# Patient Record
Sex: Male | Born: 1960 | Race: White | Hispanic: No | Marital: Single | Smoking: Never smoker
Health system: Southern US, Community
[De-identification: ages and names within clinical notes are randomized; demographics above are authoritative.]

## PROBLEM LIST (undated history)

## (undated) DIAGNOSIS — I472 Ventricular tachycardia, unspecified: Secondary | ICD-10-CM

## (undated) DIAGNOSIS — A048 Other specified bacterial intestinal infections: Secondary | ICD-10-CM

## (undated) DIAGNOSIS — I340 Nonrheumatic mitral (valve) insufficiency: Secondary | ICD-10-CM

## (undated) DIAGNOSIS — Z8701 Personal history of pneumonia (recurrent): Secondary | ICD-10-CM

## (undated) DIAGNOSIS — I480 Paroxysmal atrial fibrillation: Secondary | ICD-10-CM

## (undated) HISTORY — DX: Other cardiomyopathies: I42.8

## (undated) HISTORY — DX: Personal history of pneumonia (recurrent): Z87.01

## (undated) HISTORY — DX: Ventricular tachycardia, unspecified: I47.20

## (undated) HISTORY — DX: Chronic systolic (congestive) heart failure: I50.22

## (undated) HISTORY — DX: Ventricular tachycardia: I47.2

## (undated) HISTORY — DX: Paroxysmal atrial fibrillation: I48.0

## (undated) HISTORY — DX: Nonrheumatic mitral (valve) insufficiency: I34.0

---

## 2011-10-06 HISTORY — PX: WRIST SURGERY: SHX841

## 2012-06-28 ENCOUNTER — Emergency Department: Payer: Self-pay | Admitting: *Deleted

## 2013-08-31 IMAGING — CR LEFT WRIST - COMPLETE 3+ VIEW
1 series · 4 of 4 positions shown · non-contrast
Comparison: none

REASON FOR EXAM: laceration
COMMENTS:

[Series 1: x wrist pa left · 0.14mm/px · 4 of 4 slices shown]
[im 1/4]
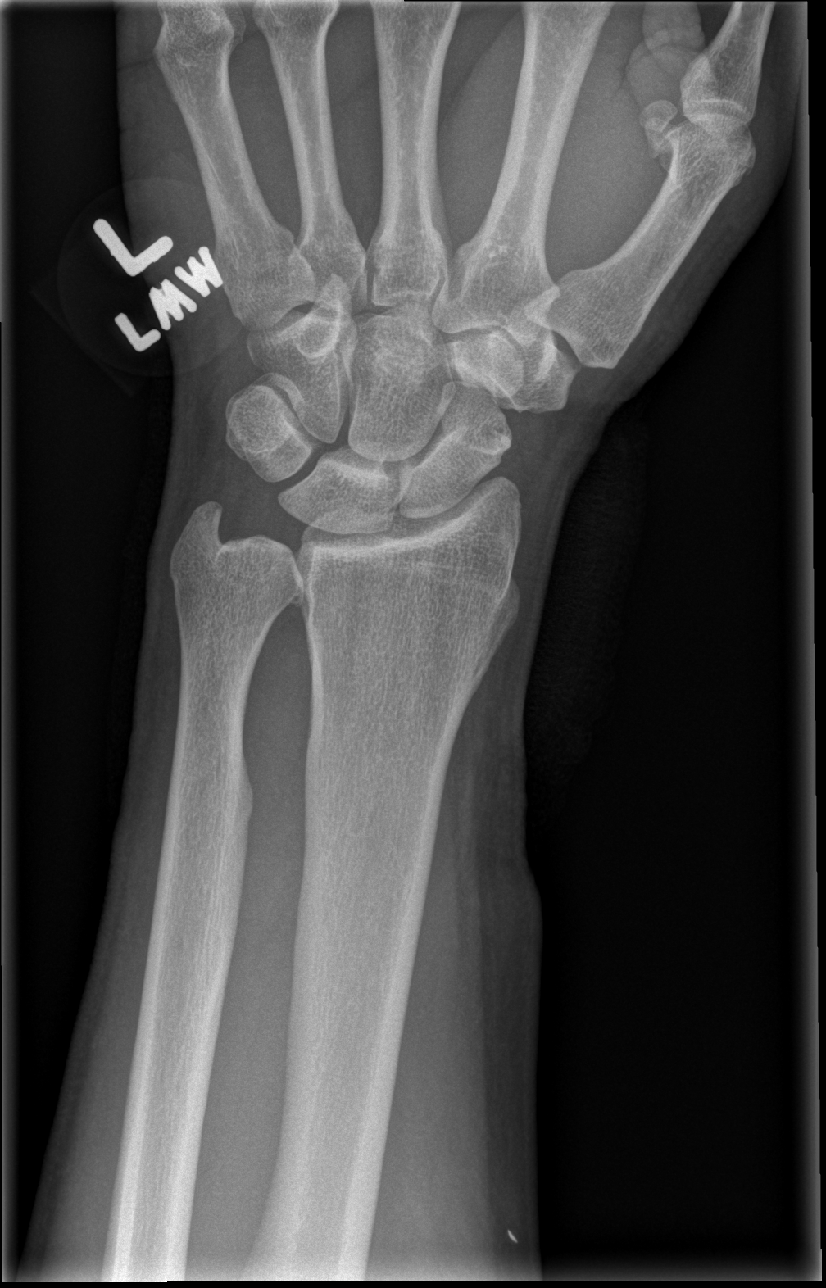
[im 2/4]
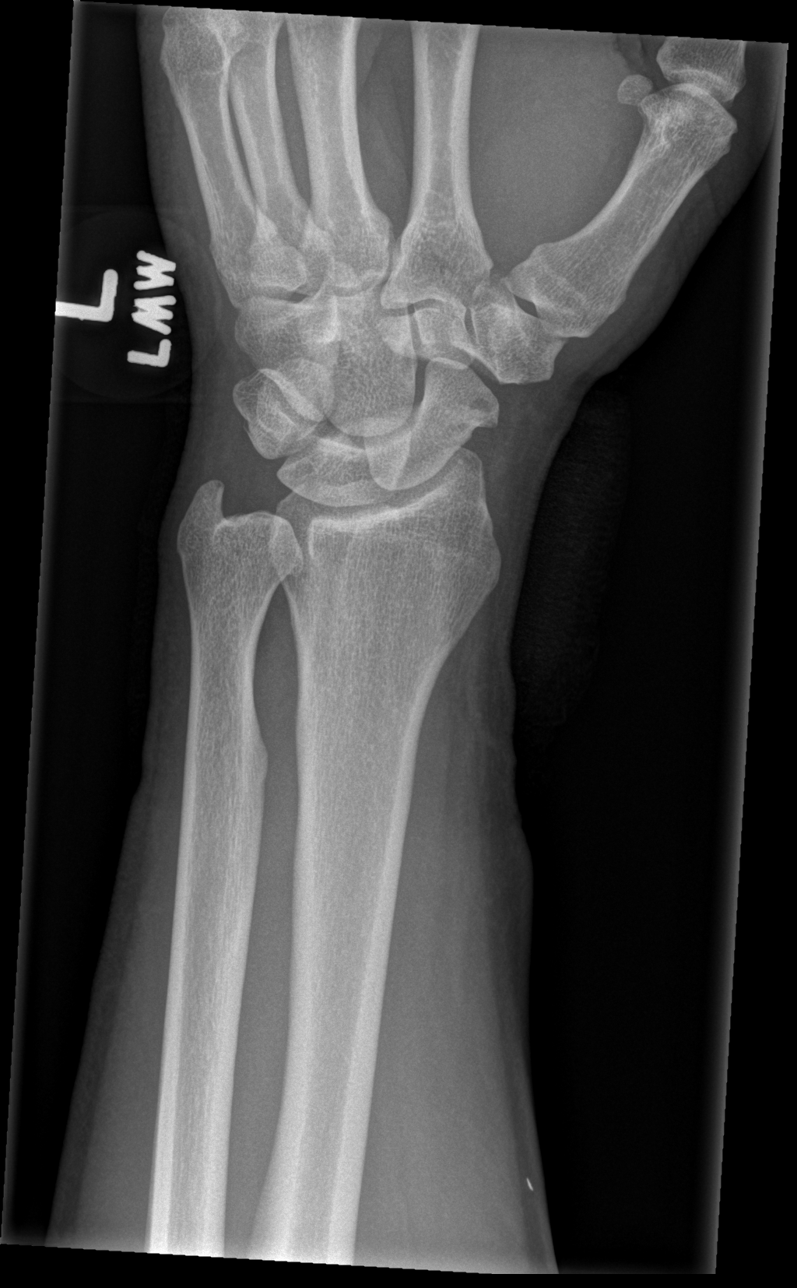
[im 3/4]
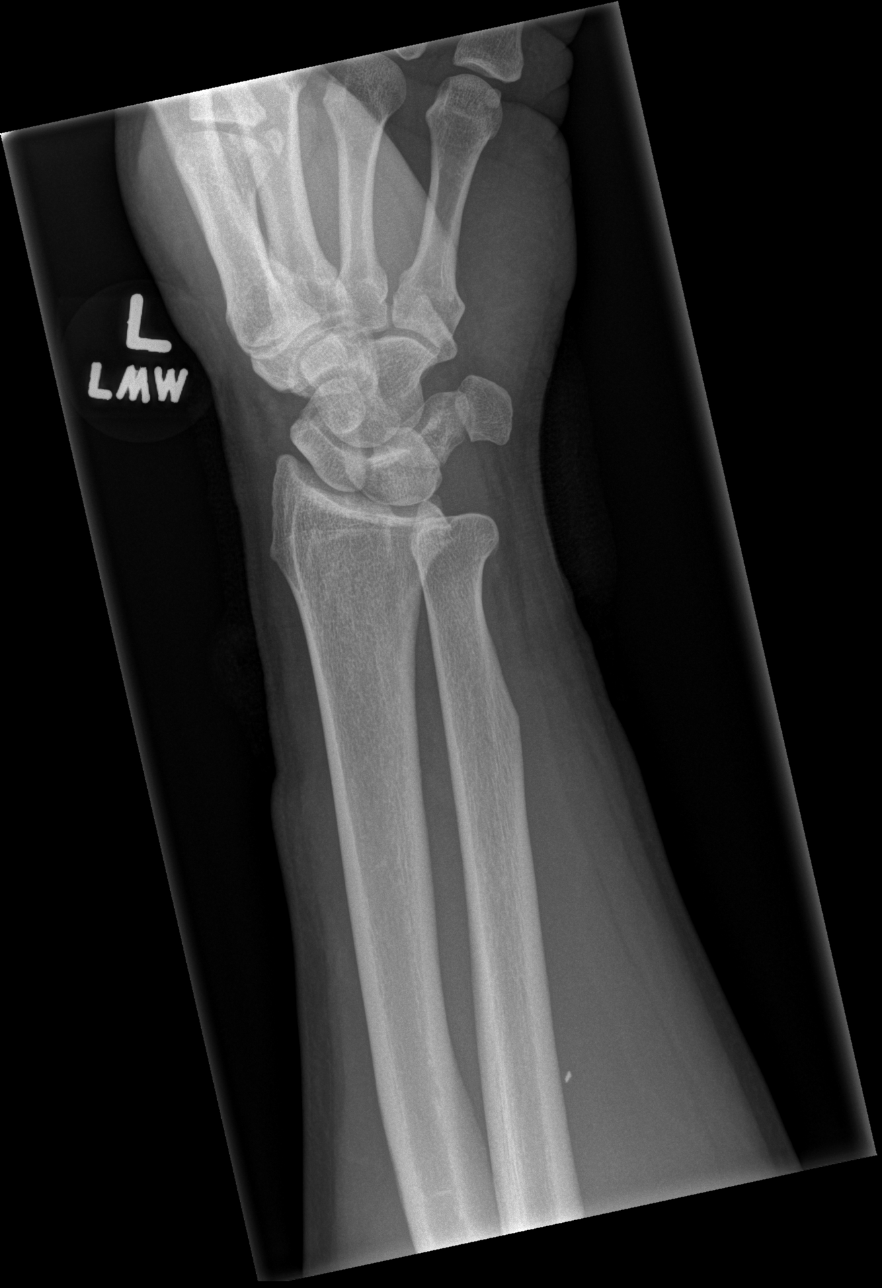
[im 4/4]
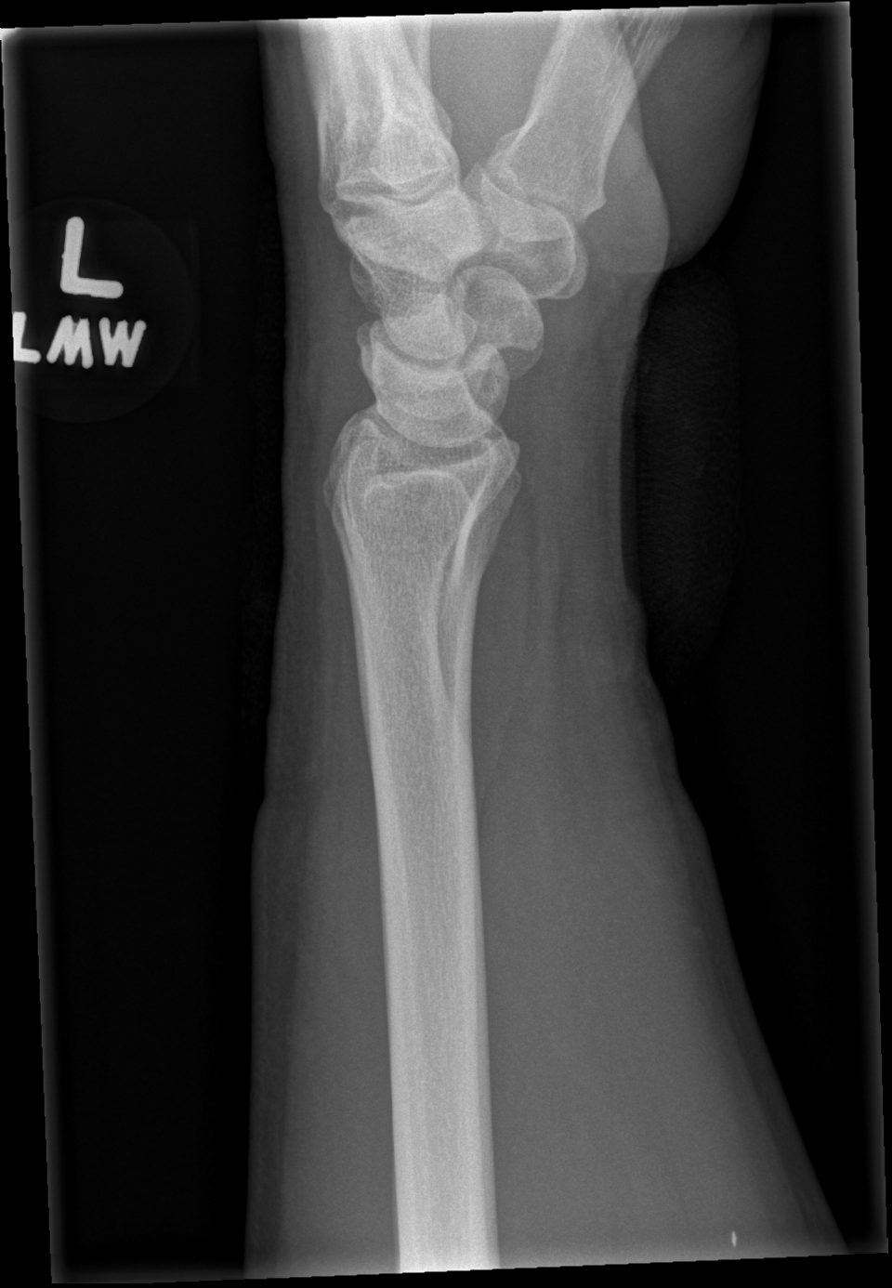

[4 of 4 positions shown; findings below may reference images not displayed]

PROCEDURE:     DXR - DXR WRIST LT COMP WITH OBLIQUES  - June 29, 2012  [DATE]

RESULT:     Four views of the left wrist reveal the bones to be adequately
mineralized. There is no evidence of an acute fracture nor dislocation. The
overlying soft tissues are normal in appearance. No significant degenerative
changes are demonstrated.
IMPRESSION: There is no evidence of an acute fracture nor evidence of a
retained foreign body. Some disruption of the soft tissues over the ventral
aspect of the wrist may be present but there is dressing material here.

[REDACTED]

## 2014-03-12 ENCOUNTER — Encounter: Payer: Self-pay | Admitting: Internal Medicine

## 2014-03-12 ENCOUNTER — Ambulatory Visit (INDEPENDENT_AMBULATORY_CARE_PROVIDER_SITE_OTHER): Payer: BC Managed Care – HMO | Admitting: Internal Medicine

## 2014-03-12 VITALS — BP 130/86 | HR 58 | Temp 98.2°F | Ht 73.0 in | Wt 255.0 lb

## 2014-03-12 DIAGNOSIS — Z125 Encounter for screening for malignant neoplasm of prostate: Secondary | ICD-10-CM

## 2014-03-12 DIAGNOSIS — Z1211 Encounter for screening for malignant neoplasm of colon: Secondary | ICD-10-CM

## 2014-03-12 DIAGNOSIS — E669 Obesity, unspecified: Secondary | ICD-10-CM | POA: Insufficient documentation

## 2014-03-12 DIAGNOSIS — Z0000 Encounter for general adult medical examination without abnormal findings: Secondary | ICD-10-CM

## 2014-03-12 NOTE — Progress Notes (Signed)
HPI  Pt presents to the clinic today to establish care. He has not had a PCP in many years. He has no concerns today.  Flu: never Tetanus: 06/2012 PSA screening: never Colon Screening: never Eye doctor: as needed Dentist: biannually  History reviewed. No pertinent past medical history.  Current Outpatient Prescriptions  Medication Sig Dispense Refill  . multivitamin (ONE-A-DAY MEN'S) TABS tablet Take 1 tablet by mouth daily.       No current facility-administered medications for this visit.    No Known Allergies  Family History  Problem Relation Age of Onset  . Hypertension Father     History   Social History  . Marital Status: Single    Spouse Name: N/A    Number of Children: N/A  . Years of Education: N/A   Occupational History  . Not on file.   Social History Main Topics  . Smoking status: Never Smoker   . Smokeless tobacco: Never Used  . Alcohol Use: Yes     Comment: occasional  . Drug Use: Not on file  . Sexual Activity: Not on file   Other Topics Concern  . Not on file   Social History Narrative  . No narrative on file    ROS:  Constitutional: Denies fever, malaise, fatigue, headache or abrupt weight changes.  HEENT: Denies eye pain, eye redness, ear pain, ringing in the ears, wax buildup, runny nose, nasal congestion, bloody nose, or sore throat. Respiratory: Denies difficulty breathing, shortness of breath, cough or sputum production.   Cardiovascular: Denies chest pain, chest tightness, palpitations or swelling in the hands or feet.  Gastrointestinal: Denies abdominal pain, bloating, constipation, diarrhea or blood in the stool.  GU: Denies frequency, urgency, pain with urination, blood in urine, odor or discharge. Musculoskeletal: Denies decrease in range of motion, difficulty with gait, muscle pain or joint pain and swelling.  Skin: Denies redness, rashes, lesions or ulcercations.  Neurological: Denies dizziness, difficulty with memory,  difficulty with speech or problems with balance and coordination.   No other specific complaints in a complete review of systems (except as listed in HPI above).  PE:  BP 130/86  Pulse 58  Temp(Src) 98.2 F (36.8 C) (Oral)  Ht 6\' 1"  (1.854 m)  Wt 255 lb (115.667 kg)  BMI 33.65 kg/m2  SpO2 98% Wt Readings from Last 3 Encounters:  03/12/14 255 lb (115.667 kg)    General: Appears their stated age, well developed, well nourished in NAD. HEENT: Head: normal shape and size; Eyes: sclera white, no icterus, conjunctiva pink, PERRLA and EOMs intact; Ears: Tm's gray and intact, normal light reflex; Nose: mucosa pink and moist, septum midline; Throat/Mouth: Teeth present, mucosa pink and moist, no lesions or ulcerations noted.  Neck: Normal range of motion. Neck supple, trachea midline. No massses, lumps or thyromegaly present.  Cardiovascular: Normal rate and rhythm. S1,S2 noted.  No murmur, rubs or gallops noted. No JVD or BLE edema. No carotid bruits noted. Pulmonary/Chest: Normal effort and positive vesicular breath sounds. No respiratory distress. No wheezes, rales or ronchi noted.  Abdomen: Soft and nontender. Normal bowel sounds, no bruits noted. No distention or masses noted. Liver, spleen and kidneys non palpable. Musculoskeletal: Normal range of motion. No signs of joint swelling. No difficulty with gait.  Neurological: Alert and oriented. Cranial nerves II-XII intact. Coordination normal. +DTRs bilaterally. Psychiatric: Mood and affect normal. Behavior is normal. Judgment and thought content normal.     Assessment and Plan:  Preventative Health Maintenance:  Encouraged  him to work on diet and exercise Will check screening labs today includng PSA Will refer to GI for colon screening  RTC in 1 year or sooner if needed

## 2014-03-12 NOTE — Patient Instructions (Addendum)

## 2014-03-12 NOTE — Progress Notes (Signed)
Pre visit review using our clinic review tool, if applicable. No additional management support is needed unless otherwise documented below in the visit note. 

## 2014-03-13 LAB — CBC
HCT: 42 % (ref 39.0–52.0)
Hemoglobin: 14.3 g/dL (ref 13.0–17.0)
MCHC: 34 g/dL (ref 30.0–36.0)
MCV: 92 fl (ref 78.0–100.0)
Platelets: 192 K/uL (ref 150.0–400.0)
RBC: 4.57 Mil/uL (ref 4.22–5.81)
RDW: 13 % (ref 11.5–15.5)
WBC: 7.2 K/uL (ref 4.0–10.5)

## 2014-03-13 LAB — COMPREHENSIVE METABOLIC PANEL
ALBUMIN: 4.4 g/dL (ref 3.5–5.2)
ALT: 64 U/L — ABNORMAL HIGH (ref 0–53)
AST: 36 U/L (ref 0–37)
Alkaline Phosphatase: 65 U/L (ref 39–117)
BUN: 18 mg/dL (ref 6–23)
CALCIUM: 9.3 mg/dL (ref 8.4–10.5)
CO2: 27 mEq/L (ref 19–32)
Chloride: 103 mEq/L (ref 96–112)
Creatinine, Ser: 1 mg/dL (ref 0.4–1.5)
GFR: 83.97 mL/min (ref >60.00)
GLUCOSE: 75 mg/dL (ref 70–99)
POTASSIUM: 4.2 meq/L (ref 3.5–5.1)
Sodium: 139 mEq/L (ref 135–145)
TOTAL PROTEIN: 7 g/dL (ref 6.0–8.3)
Total Bilirubin: 0.6 mg/dL (ref 0.2–1.2)

## 2014-03-13 LAB — HEMOGLOBIN A1C: HEMOGLOBIN A1C: 6 % (ref 4.6–6.5)

## 2014-03-13 LAB — LIPID PANEL
CHOLESTEROL: 251 mg/dL — AB (ref 0–200)
HDL: 36.4 mg/dL — ABNORMAL LOW (ref >39.00)
LDL CALC: 140 mg/dL — AB (ref 0–99)
NonHDL: 214.6
TRIGLYCERIDES: 373 mg/dL — AB (ref 0.0–149.0)
Total CHOL/HDL Ratio: 7
VLDL: 74.6 mg/dL — ABNORMAL HIGH (ref 0.0–40.0)

## 2014-03-13 LAB — PSA: PSA: 1.03 ng/mL (ref 0.10–4.00)

## 2014-03-14 ENCOUNTER — Encounter: Payer: Self-pay | Admitting: Gastroenterology

## 2014-03-14 ENCOUNTER — Telehealth: Payer: Self-pay | Admitting: Internal Medicine

## 2014-03-14 NOTE — Telephone Encounter (Signed)
Patient returned your call and he said he does want to try the 3 months of trying to improve his cholesterol on his own.  He wants to know what kind of fish oil he needs to buy. Please call patient.

## 2014-03-15 NOTE — Telephone Encounter (Signed)
Left detailed msg on VM with information per HIPAA

## 2014-05-07 ENCOUNTER — Encounter: Payer: BC Managed Care – HMO | Admitting: Gastroenterology

## 2015-11-16 ENCOUNTER — Emergency Department: Payer: BLUE CROSS/BLUE SHIELD

## 2015-11-16 ENCOUNTER — Emergency Department
Admission: EM | Admit: 2015-11-16 | Discharge: 2015-11-16 | Disposition: A | Discharge status: Home / Self Care | Payer: BLUE CROSS/BLUE SHIELD | Attending: Emergency Medicine | Admitting: Emergency Medicine

## 2015-11-16 ENCOUNTER — Encounter: Payer: Self-pay | Admitting: Emergency Medicine

## 2015-11-16 DIAGNOSIS — J159 Unspecified bacterial pneumonia: Secondary | ICD-10-CM | POA: Diagnosis not present

## 2015-11-16 DIAGNOSIS — R0602 Shortness of breath: Secondary | ICD-10-CM | POA: Diagnosis present

## 2015-11-16 DIAGNOSIS — J189 Pneumonia, unspecified organism: Secondary | ICD-10-CM

## 2015-11-16 DIAGNOSIS — Z79899 Other long term (current) drug therapy: Secondary | ICD-10-CM | POA: Insufficient documentation

## 2015-11-16 LAB — COMPREHENSIVE METABOLIC PANEL
ALK PHOS: 63 U/L (ref 38–126)
ALT: 59 U/L (ref 17–63)
AST: 43 U/L — AB (ref 15–41)
Albumin: 3.8 g/dL (ref 3.5–5.0)
Anion gap: 8 (ref 5–15)
BILIRUBIN TOTAL: 1 mg/dL (ref 0.3–1.2)
BUN: 16 mg/dL (ref 6–20)
CALCIUM: 9 mg/dL (ref 8.9–10.3)
CO2: 24 mmol/L (ref 22–32)
CREATININE: 0.95 mg/dL (ref 0.61–1.24)
Chloride: 105 mmol/L (ref 101–111)
GFR calc Af Amer: >60 mL/min (ref >60)
Glucose, Bld: 133 mg/dL — ABNORMAL HIGH (ref 65–99)
Potassium: 4.2 mmol/L (ref 3.5–5.1)
Sodium: 137 mmol/L (ref 135–145)
TOTAL PROTEIN: 7.4 g/dL (ref 6.5–8.1)

## 2015-11-16 LAB — CBC WITH DIFFERENTIAL/PLATELET
BASOS ABS: 0 10*3/uL (ref 0–0.1)
Basophils Relative: 1 %
EOS ABS: 0.1 10*3/uL (ref 0–0.7)
EOS PCT: 1 %
HCT: 43.8 % (ref 40.0–52.0)
Hemoglobin: 14.5 g/dL (ref 13.0–18.0)
Lymphocytes Relative: 19 %
Lymphs Abs: 2 10*3/uL (ref 1.0–3.6)
MCH: 31.3 pg (ref 26.0–34.0)
MCHC: 33 g/dL (ref 32.0–36.0)
MCV: 94.8 fL (ref 80.0–100.0)
Monocytes Absolute: 0.8 10*3/uL (ref 0.2–1.0)
Monocytes Relative: 8 %
Neutro Abs: 7.8 10*3/uL — ABNORMAL HIGH (ref 1.4–6.5)
Neutrophils Relative %: 73 %
PLATELETS: 250 10*3/uL (ref 150–440)
RBC: 4.62 MIL/uL (ref 4.40–5.90)
RDW: 13.2 % (ref 11.5–14.5)
WBC: 10.7 10*3/uL — AB (ref 3.8–10.6)

## 2015-11-16 NOTE — Discharge Instructions (Signed)

## 2015-11-16 NOTE — ED Notes (Signed)
Recently diagnosed with pneumonia.  Being treated with Levaquin, on dose left.  Patient states he has been feeling SOB thorughout  Coarse of illness, but feels it worsening a little bit.  Due to go back to work tomorrow, still feeling SOB and Dyspneic .  Sensations are worse when laying down.

## 2015-11-16 NOTE — ED Provider Notes (Signed)
[facility] Emergency Department Provider Note  ____________________________________________  Time seen: Approximately 6:22 PM  I have reviewed the triage vital signs and the nursing notes.   HISTORY  Chief Complaint Shortness of Breath    HPI Walter Banks is a 55 y.o. male who presents to the emergency department concerned about his pneumonia. Patient states that he was diagnosed with pneumonia on 11/10/2015. He was placed on Levaquin for symptoms. 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 He is here for further evaluation on his ability to return to work. Patient states that he is still mildly short of breath with coughing with lying down as well as bending over to tie his shoes. Otherwise she states that there has been an improvement in his symptoms. He denies any fevers or chills, difficulty breathing, dizziness or lightheadedness, chest pain, nausea or vomiting. Patient has been taking his antibiotics as prescribed. He has 1 more day on current regimen. She reports overall that he is feeling "much better."   Past Medical History  Diagnosis Date  . Pneumonia     Patient Active Problem List   Diagnosis Date Noted  . Obesity (BMI 30-39.9) 03/12/2014    Past Surgical History  Procedure Laterality Date  . Wrist surgery Left 2013    Current Outpatient Rx  Name  Route  Sig  Dispense  Refill  . multivitamin (ONE-A-DAY MEN'S) TABS tablet   Oral   Take 1 tablet by mouth daily.           Allergies Review of patient's allergies indicates no known allergies.  Family History  Problem Relation Age of Onset  . Hypertension Father   . Cancer Neg Hx   . Diabetes Neg Hx   . Stroke Neg Hx     Social History Social History  Substance Use Topics  . Smoking status: Never Smoker   . Smokeless tobacco: Never Used  . Alcohol Use: 4.8 oz/week    8 Cans of beer per week     Comment: occasional      Review of Systems  Constitutional: No fever/chills Cardiovascular: no chest pain. Respiratory: no cough. No SOB. Gastrointestinal: No abdominal pain.  No nausea, no vomiting.   Musculoskeletal: Negative for back pain. Skin: Negative for rash. Neurological: Negative for headaches, focal weakness or numbness. 10-point ROS otherwise negative.  ____________________________________________   PHYSICAL EXAM:  VITAL SIGNS: ED Triage Vitals  Enc Vitals Group     BP 11/16/15 1609 133/98 mmHg     Pulse Rate 11/16/15 1609 62     Resp 11/16/15 1609 18     Temp 11/16/15 1609 97.7 F (36.5 C)     Temp src --      SpO2 11/16/15 1609 97 %     Weight 11/16/15 1609 240 lb (108.863 kg)     Height 11/16/15 1609 6\' 1"  (1.854 m)     Head Cir --      Peak Flow --      Pain Score 11/16/15 1610 0     Pain Loc --      Pain Edu? --      Excl. in GC? --      Constitutional: Alert and oriented. Well appearing and in no acute distress. Eyes: Conjunctivae are normal. PERRL. EOMI. Head: Atraumatic. ENT:      Ears:       Nose: No congestion/rhinnorhea.      Mouth/Throat:  Mucous membranes are moist.  Neck: No stridor.   Hematological/Lymphatic/Immunilogical: No cervical lymphadenopathy. Cardiovascular: Normal rate, regular rhythm. Normal S1 and S2.  Good peripheral circulation. Respiratory: Normal respiratory effort without tachypnea or retractions. Coarse breath sounds to the left upper lobe but no appreciable wheezing, rales, rhonchi. Good air entry into the bases. Neurologic:  Normal speech and language. No gross focal neurologic deficits are appreciated.  Skin:  Skin is warm, dry and intact. No rash noted. Psychiatric: Mood and affect are normal. Speech and behavior are normal. Patient exhibits appropriate insight and judgement.   ____________________________________________   LABS (all labs ordered are listed, but only abnormal results are displayed)  Labs Reviewed  COMPREHENSIVE  METABOLIC PANEL - Abnormal; Notable for the following:    Glucose, Bld 133 (*)    AST 43 (*)    All other components within normal limits  CBC WITH DIFFERENTIAL/PLATELET - Abnormal; Notable for the following:    WBC 10.7 (*)    Neutro Abs 7.8 (*)    All other components within normal limits   ____________________________________________  EKG   ____________________________________________  RADIOLOGY Festus Barren Marilee Ditommaso, personally viewed and evaluated these images (plain radiographs) as part of my medical decision making, as well as reviewing the written report by the radiologist.  Dg Chest 2 View  11/16/2015  CLINICAL DATA:  Recent diagnosis of pneumonia with improved symptoms EXAM: CHEST  2 VIEW COMPARISON:  None. FINDINGS: Cardiac shadow is mildly enlarged. The lungs are well aerated bilaterally. No sizable effusion is noted. Curvilinear increased density is noted in the left mid lung and perihilar region projecting in the left upper lobe on the lateral projection IMPRESSION: Persistent infiltrative density in the left upper lobe. Continued follow-up is recommended. Electronically Signed   By: Alcide Clever M.D.   On: 11/16/2015 17:35    ____________________________________________    PROCEDURES  Procedure(s) performed:       Medications - No data to display   ____________________________________________   INITIAL IMPRESSION / ASSESSMENT AND PLAN / ED COURSE  Pertinent labs & imaging results that were available during my care of the patient were reviewed by me and considered in my medical decision making (see chart for details).  Patient's diagnosis is consistent with resolving left lung pneumonia. The patient's labs, x-ray, and physical exam is reassuring at this time. Patient is to finish his course of Levaquin which will be finished tomorrow. He may return to work with no shift and at this time. Patient will follow up with his primary care provider in 4-6 weeks  for repeat x-ray. He will follow up with primary care provider sooner if symptoms of pneumonia return. Patient is given ED precautions to return to the ED for any worsening or new symptoms.     ____________________________________________  FINAL CLINICAL IMPRESSION(S) / ED DIAGNOSES  Final diagnoses:  Community acquired pneumonia      NEW MEDICATIONS STARTED DURING THIS VISIT:  New Prescriptions   No medications on file        Racheal Patches, PA-C 11/16/15 1837  Governor Rooks, MD 11/17/15 0002

## 2015-11-16 NOTE — ED Notes (Addendum)
Pt states he was recently diagnosed with PNA on 2/5 and was on antibiotics and will complete tonight. Pt was seen at a walk-in clinic and given Levaquin.  He is also using inhaler every few hours for shortness of breath. Pt is worried about going back to work and needs a work note if not.  Pt's work note from urgent care is good for patient to go back to work on Sunday 2/12.

## 2015-11-26 ENCOUNTER — Encounter: Payer: Self-pay | Admitting: Internal Medicine

## 2015-11-26 ENCOUNTER — Ambulatory Visit (INDEPENDENT_AMBULATORY_CARE_PROVIDER_SITE_OTHER): Payer: BLUE CROSS/BLUE SHIELD | Admitting: Internal Medicine

## 2015-11-26 VITALS — BP 120/76 | HR 115 | Temp 97.4°F | Wt 246.0 lb

## 2015-11-26 DIAGNOSIS — J189 Pneumonia, unspecified organism: Secondary | ICD-10-CM | POA: Diagnosis not present

## 2015-11-26 NOTE — Patient Instructions (Signed)

## 2015-11-26 NOTE — Progress Notes (Signed)
Subjective:    Patient ID: Walter Banks, male    DOB: 01-31-1961, 55 y.o.   MRN: [id number]  HPI  Pt presents to the clinic today to follow up pneumonia. He reports he went to [facility] 11/10/15 for cough and shortness of breath. Chest xray was consistent with pneumonia. He was treated with Levaquin x 7 days. He went to the ER on 11/16/15 to get clearance to go back to work. He was feeling better but reports he still had some cough and shortness of breath. Xray at that time showed some improvement in his pneumonia. He was advised to finish out his Levaquin and that he should be fine to return to work.  He is concerned today about ongoing intermittent cough. The cough is nonproductive. He denies fever, chills or shortness of breath. The cough is not keeping him up at night. He is concerned that his pneumonia is returning. He has not tried anything OTC. He has not had his flu shot.  Review of Systems      Past Medical History  Diagnosis Date  . Pneumonia     Current Outpatient Prescriptions  Medication Sig Dispense Refill  . multivitamin (ONE-A-DAY MEN'S) TABS tablet Take 1 tablet by mouth daily.    Marland Kitchen PROAIR HFA 108 (90 Base) MCG/ACT inhaler INHALE 2 PUFF BY INHALATION ROUTE EVERY 4 - 6 HOURS AS NEEDED  0  . fluticasone (FLONASE) 50 MCG/ACT nasal spray Reported on 11/26/2015  0   No current facility-administered medications for this visit.    No Known Allergies  Family History  Problem Relation Age of Onset  . Hypertension Father   . Cancer Neg Hx   . Diabetes Neg Hx   . Stroke Neg Hx     Social History   Social History  . Marital Status: Single    Spouse Name: N/A  . Number of Children: N/A  . Years of Education: N/A   Occupational History  . Not on file.   Social History Main Topics  . Smoking status: Never Smoker   . Smokeless tobacco: Never Used  . Alcohol Use: 4.8 oz/week    8 Cans of beer per week     Comment: occasional  . Drug Use: No  . Sexual Activity: Yes     Other Topics Concern  . Not on file   Social History Narrative     Constitutional: Denies fever, malaise, fatigue, headache or abrupt weight changes.  HEENT: Denies eye pain, eye redness, ear pain, ringing in the ears, wax buildup, runny nose, nasal congestion, bloody nose, or sore throat. Respiratory: Pt reports cough.  Denies difficulty breathing, shortness of breath, cough or sputum production.   Cardiovascular: Denies chest pain, chest tightness, palpitations or swelling in the hands or feet.   No other specific complaints in a complete review of systems (except as listed in HPI above).  Objective:   Physical Exam  BP 120/76 mmHg  Pulse 115  Temp(Src) 97.4 F (36.3 C) (Oral)  Wt 246 lb (111.585 kg)  SpO2 97% Wt Readings from Last 3 Encounters:  11/26/15 246 lb (111.585 kg)  11/16/15 240 lb (108.863 kg)  03/12/14 255 lb (115.667 kg)    General: Appears his stated age, well developed, well nourished in NAD. HEENT: Head: normal shape and size; Throat/Mouth: Teeth present, mucosa pink and moist, no exudate, lesions or ulcerations noted.  Neck:  No adenopathy noted.  Cardiovascular: Tachycardic with normal rhythm. S1,S2 noted.  No murmur, rubs or  gallops noted.  Pulmonary/Chest: Normal effort and positive vesicular breath sounds. No respiratory distress. No wheezes, rales or ronchi noted.    BMET    Component Value Date/Time   NA 137 11/16/2015 1612   K 4.2 11/16/2015 1612   CL 105 11/16/2015 1612   CO2 24 11/16/2015 1612   GLUCOSE 133* 11/16/2015 1612   BUN 16 11/16/2015 1612   CREATININE 0.95 11/16/2015 1612   CALCIUM 9.0 11/16/2015 1612   GFRNONAA >60 11/16/2015 1612   GFRAA >60 11/16/2015 1612    Lipid Panel     Component Value Date/Time   CHOL 251* 03/12/2014 1537   TRIG 373.0* 03/12/2014 1537   HDL 36.40* 03/12/2014 1537   CHOLHDL 7 03/12/2014 1537   VLDL 74.6* 03/12/2014 1537   LDLCALC 140* 03/12/2014 1537    CBC    Component Value Date/Time    WBC 10.7* 11/16/2015 1701   RBC 4.62 11/16/2015 1701   HGB 14.5 11/16/2015 1701   HCT 43.8 11/16/2015 1701   PLT 250 11/16/2015 1701   MCV 94.8 11/16/2015 1701   MCH 31.3 11/16/2015 1701   MCHC 33.0 11/16/2015 1701   RDW 13.2 11/16/2015 1701   LYMPHSABS 2.0 11/16/2015 1701   MONOABS 0.8 11/16/2015 1701   EOSABS 0.1 11/16/2015 1701   BASOSABS 0.0 11/16/2015 1701    Hgb A1C Lab Results  Component Value Date   HGBA1C 6.0 03/12/2014         Assessment & Plan:   UC/ER follow up for CAP:  Reassurance provided that his pneumonia is clearing, but he may continue to have this cough for 4-6 weeks He can take cough drops OTC We will repeat his chest xray in 1 month to check for resolution of pna Return precautions given  Make an appt for your annual exam on the way out

## 2015-11-26 NOTE — Progress Notes (Signed)
Pre visit review using our clinic review tool, if applicable. No additional management support is needed unless otherwise documented below in the visit note. 

## 2015-12-02 ENCOUNTER — Ambulatory Visit (INDEPENDENT_AMBULATORY_CARE_PROVIDER_SITE_OTHER): Payer: BLUE CROSS/BLUE SHIELD | Admitting: Internal Medicine

## 2015-12-02 ENCOUNTER — Encounter: Payer: Self-pay | Admitting: Internal Medicine

## 2015-12-02 VITALS — BP 122/80 | HR 126 | Temp 97.8°F | Wt 248.0 lb

## 2015-12-02 DIAGNOSIS — R0602 Shortness of breath: Secondary | ICD-10-CM | POA: Diagnosis not present

## 2015-12-02 DIAGNOSIS — R05 Cough: Secondary | ICD-10-CM | POA: Diagnosis not present

## 2015-12-02 DIAGNOSIS — R059 Cough, unspecified: Secondary | ICD-10-CM

## 2015-12-02 DIAGNOSIS — J189 Pneumonia, unspecified organism: Secondary | ICD-10-CM | POA: Diagnosis not present

## 2015-12-02 NOTE — Progress Notes (Signed)
Pre visit review using our clinic review tool, if applicable. No additional management support is needed unless otherwise documented below in the visit note. 

## 2015-12-02 NOTE — Progress Notes (Signed)
Subjective:    Patient ID: Walter Banks, male    DOB: 10/29/1960, 55 y.o.   MRN: [id number]  HPI  Pt presents to the clinic today to follow up pneumonia. He reports he went to [facility] 11/10/15 for cough and shortness of breath. Chest xray was consistent with pneumonia. He was treated with Levaquin x 7 days. He went to the ER on 11/16/15 to get clearance to go back to work. He was feeling better but reports he still had some cough and shortness of breath. Xray at that time showed some improvement in his pneumonia. He was advised to finish out his Levaquin and that he should be fine to return to work. He was seen 2/21- to follow up pneumonia. He reports intermittent ongoing cough but no SOB. He was advised that cough may persist and he should follow up in 1 month for repeat chest xray. Today, he reports increasing shortness of breath, sweats and weakness. He started feeling worse again 3 days ago. He is not taking anything OTC for this.  He is concerned today about ongoing intermittent cough. The cough is nonproductive. He denies fever, chills or shortness of breath. The cough is not keeping him up at night. He is concerned that his pneumonia is returning. He has not tried anything OTC. He has not had his flu shot.  Review of Systems      Past Medical History  Diagnosis Date  . Pneumonia     Current Outpatient Prescriptions  Medication Sig Dispense Refill  . multivitamin (ONE-A-DAY MEN'S) TABS tablet Take 1 tablet by mouth daily.    . fluticasone (FLONASE) 50 MCG/ACT nasal spray Reported on 12/02/2015  0  . PROAIR HFA 108 (90 Base) MCG/ACT inhaler Reported on 12/02/2015  0   No current facility-administered medications for this visit.    No Known Allergies  Family History  Problem Relation Age of Onset  . Hypertension Father   . Cancer Neg Hx   . Diabetes Neg Hx   . Stroke Neg Hx     Social History   Social History  . Marital Status: Single    Spouse Name: N/A  . Number of  Children: N/A  . Years of Education: N/A   Occupational History  . Not on file.   Social History Main Topics  . Smoking status: Never Smoker   . Smokeless tobacco: Never Used  . Alcohol Use: 4.8 oz/week    8 Cans of beer per week     Comment: occasional  . Drug Use: No  . Sexual Activity: Yes   Other Topics Concern  . Not on file   Social History Narrative     Constitutional: Pt reports fatigue. Denies fever, malaise, headache or abrupt weight changes.  HEENT: Denies eye pain, eye redness, ear pain, ringing in the ears, wax buildup, runny nose, nasal congestion, bloody nose, or sore throat. Respiratory: Pt reports cough and shortness of  breath.  Denies difficulty breathing, or sputum production.   Cardiovascular: Denies chest pain, chest tightness, palpitations or swelling in the hands or feet.   No other specific complaints in a complete review of systems (except as listed in HPI above).  Objective:   Physical Exam  Pulse 126  Temp(Src) 97.8 F (36.6 C) (Oral)  Wt 248 lb (112.492 kg)  SpO2 98% Wt Readings from Last 3 Encounters:  12/02/15 248 lb (112.492 kg)  11/26/15 246 lb (111.585 kg)  11/16/15 240 lb (108.863 kg)  General: Appears his stated age,  in NAD. HEENT: Head: normal shape and size; Throat/Mouth: Teeth present, mucosa pink and moist, no exudate, lesions or ulcerations noted.  Neck:  No adenopathy noted.  Cardiovascular: Tachycardic with normal rhythm. S1,S2 noted.  No murmur, rubs or gallops noted.  Pulmonary/Chest: Normal effort and positive vesicular breath sounds. No respiratory distress. No wheezes, rales or ronchi noted.    BMET    Component Value Date/Time   NA 137 11/16/2015 1612   K 4.2 11/16/2015 1612   CL 105 11/16/2015 1612   CO2 24 11/16/2015 1612   GLUCOSE 133* 11/16/2015 1612   BUN 16 11/16/2015 1612   CREATININE 0.95 11/16/2015 1612   CALCIUM 9.0 11/16/2015 1612   GFRNONAA >60 11/16/2015 1612   GFRAA >60 11/16/2015 1612     Lipid Panel     Component Value Date/Time   CHOL 251* 03/12/2014 1537   TRIG 373.0* 03/12/2014 1537   HDL 36.40* 03/12/2014 1537   CHOLHDL 7 03/12/2014 1537   VLDL 74.6* 03/12/2014 1537   LDLCALC 140* 03/12/2014 1537    CBC    Component Value Date/Time   WBC 10.7* 11/16/2015 1701   RBC 4.62 11/16/2015 1701   HGB 14.5 11/16/2015 1701   HCT 43.8 11/16/2015 1701   PLT 250 11/16/2015 1701   MCV 94.8 11/16/2015 1701   MCH 31.3 11/16/2015 1701   MCHC 33.0 11/16/2015 1701   RDW 13.2 11/16/2015 1701   LYMPHSABS 2.0 11/16/2015 1701   MONOABS 0.8 11/16/2015 1701   EOSABS 0.1 11/16/2015 1701   BASOSABS 0.0 11/16/2015 1701    Hgb A1C Lab Results  Component Value Date   HGBA1C 6.0 03/12/2014         Assessment & Plan:   CAP, cough and shortness of breath:  He is tachycardic but exam otherwise benign Will check CBC, CMET, and D dimer today If WBC elevated, consider additional antibiotics If D dimer elevated, will obtain CT angio chest to rule out PE Rest, push fluids Work note provided to stay out of work this week  Will follow up after labs, to ER if worse

## 2015-12-02 NOTE — Patient Instructions (Signed)

## 2015-12-03 ENCOUNTER — Telehealth: Payer: Self-pay

## 2015-12-03 LAB — CBC WITH DIFFERENTIAL/PLATELET
BASOS PCT: 1 % (ref 0.0–3.0)
Basophils Absolute: 0.1 10*3/uL (ref 0.0–0.1)
EOS PCT: 1.4 % (ref 0.0–5.0)
Eosinophils Absolute: 0.1 10*3/uL (ref 0.0–0.7)
HEMATOCRIT: 39.3 % (ref 39.0–52.0)
Hemoglobin: 13.3 g/dL (ref 13.0–17.0)
LYMPHS PCT: 24.7 % (ref 12.0–46.0)
Lymphs Abs: 1.8 10*3/uL (ref 0.7–4.0)
MCHC: 33.7 g/dL (ref 30.0–36.0)
MCV: 94 fl (ref 78.0–100.0)
MONOS PCT: 11 % (ref 3.0–12.0)
Monocytes Absolute: 0.8 10*3/uL (ref 0.1–1.0)
NEUTROS PCT: 61.9 % (ref 43.0–77.0)
Neutro Abs: 4.6 10*3/uL (ref 1.4–7.7)
PLATELETS: 179 10*3/uL (ref 150.0–400.0)
RBC: 4.19 Mil/uL — ABNORMAL LOW (ref 4.22–5.81)
RDW: 13.6 % (ref 11.5–15.5)
WBC: 7.4 10*3/uL (ref 4.0–10.5)

## 2015-12-03 LAB — COMPREHENSIVE METABOLIC PANEL
ALT: 31 U/L (ref 0–53)
AST: 34 U/L (ref 0–37)
Albumin: 3.7 g/dL (ref 3.5–5.2)
Alkaline Phosphatase: 55 U/L (ref 39–117)
BILIRUBIN TOTAL: 1.1 mg/dL (ref 0.2–1.2)
BUN: 20 mg/dL (ref 6–23)
CALCIUM: 8.8 mg/dL (ref 8.4–10.5)
CHLORIDE: 107 meq/L (ref 96–112)
CO2: 28 meq/L (ref 19–32)
CREATININE: 0.96 mg/dL (ref 0.40–1.50)
GFR: 86.44 mL/min (ref >60.00)
Glucose, Bld: 82 mg/dL (ref 70–99)
Potassium: 5 mEq/L (ref 3.5–5.1)
Sodium: 141 mEq/L (ref 135–145)
Total Protein: 6.2 g/dL (ref 6.0–8.3)

## 2015-12-03 NOTE — Telephone Encounter (Signed)
We are still waiting on some ofhis labs to come back. His electrolytes and WBC count are normal. Is he not sleeping because he is coughing?

## 2015-12-03 NOTE — Telephone Encounter (Signed)
Pt left v/m; pt was seen 12/02/15; pt request cb with lab results and pt request med to help pt sleep at night to [facility] [place]. Pt did not sleep last night at all. Pt request cb.

## 2015-12-03 NOTE — Telephone Encounter (Signed)
Pt walked in to office and is waiting in the waiting room; the reason pt could not sleep was due to abd pain;pt said if standing minimal pain but when sits or lays down pain level goes to 10.

## 2015-12-03 NOTE — Telephone Encounter (Signed)
Pt notified as instructed and pt scheduled appt 12/04/15 with Pamala Hurry NP. Advised pt if condition changes or worsens prior to appt pt will go to UC or ED for eval. Pt voiced understanding.

## 2015-12-03 NOTE — Telephone Encounter (Signed)
He did not discuss any abdominal pain with me. He can try Tylenol. He can make an appt to discuss abdominal pain if he would like.

## 2015-12-04 ENCOUNTER — Ambulatory Visit (INDEPENDENT_AMBULATORY_CARE_PROVIDER_SITE_OTHER)
Admission: RE | Admit: 2015-12-04 | Discharge: 2015-12-04 | Disposition: A | Discharge status: Home / Self Care | Payer: BLUE CROSS/BLUE SHIELD | Source: Ambulatory Visit | Attending: Internal Medicine | Admitting: Internal Medicine

## 2015-12-04 ENCOUNTER — Ambulatory Visit (INDEPENDENT_AMBULATORY_CARE_PROVIDER_SITE_OTHER): Payer: BLUE CROSS/BLUE SHIELD | Admitting: Internal Medicine

## 2015-12-04 ENCOUNTER — Encounter: Payer: Self-pay | Admitting: Internal Medicine

## 2015-12-04 VITALS — BP 124/84 | HR 114 | Temp 97.5°F | Wt 249.0 lb

## 2015-12-04 DIAGNOSIS — R140 Abdominal distension (gaseous): Secondary | ICD-10-CM

## 2015-12-04 DIAGNOSIS — R110 Nausea: Secondary | ICD-10-CM | POA: Diagnosis not present

## 2015-12-04 DIAGNOSIS — I5022 Chronic systolic (congestive) heart failure: Secondary | ICD-10-CM

## 2015-12-04 DIAGNOSIS — K6289 Other specified diseases of anus and rectum: Secondary | ICD-10-CM

## 2015-12-04 HISTORY — DX: Chronic systolic (congestive) heart failure: I50.22

## 2015-12-04 LAB — D-DIMER, QUANTITATIVE (NOT AT ARMC): D DIMER QUANT: 0.48 ug{FEU}/mL (ref 0.00–0.48)

## 2015-12-04 LAB — AMYLASE: AMYLASE: 22 U/L — AB (ref 27–131)

## 2015-12-04 LAB — LIPASE: Lipase: 19 U/L (ref 11.0–59.0)

## 2015-12-04 NOTE — Progress Notes (Signed)
Subjective:    Patient ID: Walter Banks, male    DOB: 08-24-1961, 55 y.o.   MRN: [id number]  HPI  Pt presents to the clinic today with c/o abdominal pain. This started 1-2 weeks ago. He describes the abdominal pain as tightness. The pain is worse with sitting, laying down. It seems better when he is standing up or walking. He does have some associated bloating, gas and nausea. He is have a few small BM's daily, but reports a burning sensation in his rectum with bowel movements. He denies blood in his stool. He denies urinary complaints. He denies reflux or heartburn. He was on Levaquin 11/2015 for treatment of community acquired pneumonia but other than that he denies changes in medications or diet.  Review of Systems      Past Medical History  Diagnosis Date  . Pneumonia     Current Outpatient Prescriptions  Medication Sig Dispense Refill  . fluticasone (FLONASE) 50 MCG/ACT nasal spray Reported on 12/02/2015  0  . multivitamin (ONE-A-DAY MEN'S) TABS tablet Take 1 tablet by mouth daily.    Marland Kitchen PROAIR HFA 108 (90 Base) MCG/ACT inhaler Reported on 12/02/2015  0   No current facility-administered medications for this visit.    No Known Allergies  Family History  Problem Relation Age of Onset  . Hypertension Father   . Cancer Neg Hx   . Diabetes Neg Hx   . Stroke Neg Hx     Social History   Social History  . Marital Status: Single    Spouse Name: N/A  . Number of Children: N/A  . Years of Education: N/A   Occupational History  . Not on file.   Social History Main Topics  . Smoking status: Never Smoker   . Smokeless tobacco: Never Used  . Alcohol Use: 4.8 oz/week    8 Cans of beer per week     Comment: occasional  . Drug Use: No  . Sexual Activity: Yes   Other Topics Concern  . Not on file   Social History Narrative     Constitutional: Denies fever, malaise, fatigue, headache or abrupt weight changes.  Respiratory: Pt reports shortness of breath. Denies  difficulty breathing,  cough or sputum production.   Cardiovascular: Denies chest pain, chest tightness, palpitations or swelling in the hands or feet.  Gastrointestinal: Pt reports abdominal pain. Denies bloating, constipation, diarrhea or blood in the stool.  GU: Denies urgency, frequency, pain with urination, burning sensation, blood in urine, odor or discharge.  No other specific complaints in a complete review of systems (except as listed in HPI above).   Objective:   Physical Exam  BP 124/84 mmHg  Pulse 114  Temp(Src) 97.5 F (36.4 C) (Oral)  Wt 249 lb (112.946 kg)  SpO2 96% Wt Readings from Last 3 Encounters:  12/04/15 249 lb (112.946 kg)  12/02/15 248 lb (112.492 kg)  11/26/15 246 lb (111.585 kg)    General: Appears his stated age, in NAD. 9 lb weight gain in last 30 days. Cardiovascular: Tachycardic with normal rhythm. S1,S2 noted.  No murmur, rubs or gallops noted.  Pulmonary/Chest: Normal effort and positive vesicular breath sounds. No respiratory distress. No wheezes, rales or ronchi noted.  Abdomen: Distended but soft and tender in the RUQ, epigastric area. He is guarding. Normal bowel sounds. No masses noted. Liver, spleen and kidneys non palpable. Rectal: No external hemorrhoids. Normal rectal tone. No mass noted. Mucousy stool noted in the rectal vault.  BMET  Component Value Date/Time   NA 141 12/02/2015 1658   K 5.0 12/02/2015 1658   CL 107 12/02/2015 1658   CO2 28 12/02/2015 1658   GLUCOSE 82 12/02/2015 1658   BUN 20 12/02/2015 1658   CREATININE 0.96 12/02/2015 1658   CALCIUM 8.8 12/02/2015 1658   GFRNONAA >60 11/16/2015 1612   GFRAA >60 11/16/2015 1612    Lipid Panel     Component Value Date/Time   CHOL 251* 03/12/2014 1537   TRIG 373.0* 03/12/2014 1537   HDL 36.40* 03/12/2014 1537   CHOLHDL 7 03/12/2014 1537   VLDL 74.6* 03/12/2014 1537   LDLCALC 140* 03/12/2014 1537    CBC    Component Value Date/Time   WBC 7.4 12/02/2015 1658   RBC  4.19* 12/02/2015 1658   HGB 13.3 12/02/2015 1658   HCT 39.3 12/02/2015 1658   PLT 179.0 12/02/2015 1658   MCV 94.0 12/02/2015 1658   MCH 31.3 11/16/2015 1701   MCHC 33.7 12/02/2015 1658   RDW 13.6 12/02/2015 1658   LYMPHSABS 1.8 12/02/2015 1658   MONOABS 0.8 12/02/2015 1658   EOSABS 0.1 12/02/2015 1658   BASOSABS 0.1 12/02/2015 1658    Hgb A1C Lab Results  Component Value Date   HGBA1C 6.0 03/12/2014         Assessment & Plan:   Abdominal pain, distention, bloating, nausea, and burning sensation in rectum:  Hemoccult negative He had a normal CMET last week Will check H Pylori, amylase and lipase today KUB ordered today Avoid NSAIDS, Tylenol as needed for pain  Will follow up after labs and xray, to ER if worse

## 2015-12-04 NOTE — Patient Instructions (Signed)

## 2015-12-04 NOTE — Progress Notes (Signed)
Pre visit review using our clinic review tool, if applicable. No additional management support is needed unless otherwise documented below in the visit note. 

## 2015-12-05 ENCOUNTER — Other Ambulatory Visit: Payer: Self-pay | Admitting: Internal Medicine

## 2015-12-05 ENCOUNTER — Telehealth: Payer: Self-pay | Admitting: Internal Medicine

## 2015-12-05 LAB — H. PYLORI ANTIBODY, IGG: H PYLORI IGG: POSITIVE — AB

## 2015-12-05 MED ORDER — PANTOPRAZOLE SODIUM 40 MG PO TBEC: 40.0000 mg | DELAYED_RELEASE_TABLET | Freq: Two times a day (BID) | ORAL | Status: DC

## 2015-12-05 MED ORDER — AMOXICILLIN 500 MG PO CAPS: 1000.0000 mg | ORAL_CAPSULE | Freq: Two times a day (BID) | ORAL | Status: DC

## 2015-12-05 MED ORDER — CLARITHROMYCIN 500 MG PO TABS: 500.0000 mg | ORAL_TABLET | Freq: Two times a day (BID) | ORAL | Status: DC

## 2015-12-05 NOTE — Telephone Encounter (Signed)
Patient returned Melanie's call.  Please call patient back about lab results.

## 2015-12-08 ENCOUNTER — Inpatient Hospital Stay (HOSPITAL_COMMUNITY)
Admit: 2015-12-08 | Discharge: 2015-12-08 | Disposition: A | Discharge status: Home / Self Care | Payer: BLUE CROSS/BLUE SHIELD | Attending: Internal Medicine | Admitting: Internal Medicine

## 2015-12-08 ENCOUNTER — Emergency Department: Payer: BLUE CROSS/BLUE SHIELD

## 2015-12-08 ENCOUNTER — Inpatient Hospital Stay
Admission: EM | Admit: 2015-12-08 | Discharge: 2015-12-13 | DRG: 286 | Disposition: A | Discharge status: Home / Self Care | Payer: BLUE CROSS/BLUE SHIELD | Attending: Internal Medicine | Admitting: Internal Medicine

## 2015-12-08 ENCOUNTER — Encounter: Payer: Self-pay | Admitting: Family Medicine

## 2015-12-08 DIAGNOSIS — Z792 Long term (current) use of antibiotics: Secondary | ICD-10-CM

## 2015-12-08 DIAGNOSIS — Z8249 Family history of ischemic heart disease and other diseases of the circulatory system: Secondary | ICD-10-CM

## 2015-12-08 DIAGNOSIS — K2970 Gastritis, unspecified, without bleeding: Secondary | ICD-10-CM | POA: Diagnosis present

## 2015-12-08 DIAGNOSIS — Z6831 Body mass index (BMI) 31.0-31.9, adult: Secondary | ICD-10-CM

## 2015-12-08 DIAGNOSIS — I2510 Atherosclerotic heart disease of native coronary artery without angina pectoris: Secondary | ICD-10-CM | POA: Diagnosis not present

## 2015-12-08 DIAGNOSIS — J810 Acute pulmonary edema: Secondary | ICD-10-CM

## 2015-12-08 DIAGNOSIS — I110 Hypertensive heart disease with heart failure: Secondary | ICD-10-CM | POA: Diagnosis present

## 2015-12-08 DIAGNOSIS — I509 Heart failure, unspecified: Secondary | ICD-10-CM

## 2015-12-08 DIAGNOSIS — E669 Obesity, unspecified: Secondary | ICD-10-CM | POA: Diagnosis present

## 2015-12-08 DIAGNOSIS — I4819 Other persistent atrial fibrillation: Secondary | ICD-10-CM

## 2015-12-08 DIAGNOSIS — I428 Other cardiomyopathies: Secondary | ICD-10-CM

## 2015-12-08 DIAGNOSIS — I959 Hypotension, unspecified: Secondary | ICD-10-CM | POA: Diagnosis not present

## 2015-12-08 DIAGNOSIS — I5031 Acute diastolic (congestive) heart failure: Secondary | ICD-10-CM | POA: Diagnosis not present

## 2015-12-08 DIAGNOSIS — I5021 Acute systolic (congestive) heart failure: Secondary | ICD-10-CM

## 2015-12-08 DIAGNOSIS — B9681 Helicobacter pylori [H. pylori] as the cause of diseases classified elsewhere: Secondary | ICD-10-CM | POA: Diagnosis present

## 2015-12-08 DIAGNOSIS — I472 Ventricular tachycardia, unspecified: Secondary | ICD-10-CM

## 2015-12-08 DIAGNOSIS — I340 Nonrheumatic mitral (valve) insufficiency: Secondary | ICD-10-CM | POA: Diagnosis present

## 2015-12-08 DIAGNOSIS — I420 Dilated cardiomyopathy: Secondary | ICD-10-CM

## 2015-12-08 DIAGNOSIS — R0602 Shortness of breath: Secondary | ICD-10-CM | POA: Diagnosis present

## 2015-12-08 DIAGNOSIS — I429 Cardiomyopathy, unspecified: Secondary | ICD-10-CM | POA: Diagnosis not present

## 2015-12-08 DIAGNOSIS — Z79899 Other long term (current) drug therapy: Secondary | ICD-10-CM | POA: Diagnosis not present

## 2015-12-08 DIAGNOSIS — I4891 Unspecified atrial fibrillation: Secondary | ICD-10-CM | POA: Diagnosis present

## 2015-12-08 HISTORY — DX: Other specified bacterial intestinal infections: A04.8

## 2015-12-08 LAB — COMPREHENSIVE METABOLIC PANEL
ALBUMIN: 3.4 g/dL — AB (ref 3.5–5.0)
ALK PHOS: 57 U/L (ref 38–126)
ALT: 43 U/L (ref 17–63)
AST: 47 U/L — AB (ref 15–41)
Anion gap: 10 (ref 5–15)
BUN: 19 mg/dL (ref 6–20)
CHLORIDE: 110 mmol/L (ref 101–111)
CO2: 20 mmol/L — AB (ref 22–32)
CREATININE: 0.92 mg/dL (ref 0.61–1.24)
Calcium: 8.2 mg/dL — ABNORMAL LOW (ref 8.9–10.3)
GFR calc Af Amer: >60 mL/min (ref >60)
GLUCOSE: 99 mg/dL (ref 65–99)
POTASSIUM: 4.2 mmol/L (ref 3.5–5.1)
SODIUM: 140 mmol/L (ref 135–145)
Total Bilirubin: 1.9 mg/dL — ABNORMAL HIGH (ref 0.3–1.2)
Total Protein: 6 g/dL — ABNORMAL LOW (ref 6.5–8.1)

## 2015-12-08 LAB — CBC
HEMATOCRIT: 40.1 % (ref 40.0–52.0)
HEMOGLOBIN: 13.4 g/dL (ref 13.0–18.0)
MCH: 31.8 pg (ref 26.0–34.0)
MCHC: 33.4 g/dL (ref 32.0–36.0)
MCV: 95.2 fL (ref 80.0–100.0)
Platelets: 158 10*3/uL (ref 150–440)
RBC: 4.21 MIL/uL — AB (ref 4.40–5.90)
RDW: 13.8 % (ref 11.5–14.5)
WBC: 9.5 10*3/uL (ref 3.8–10.6)

## 2015-12-08 LAB — TROPONIN I
TROPONIN I: 0.04 ng/mL — AB (ref <0.031)
Troponin I: <0.03 ng/mL (ref <0.031)
Troponin I: <0.03 ng/mL (ref <0.031)

## 2015-12-08 LAB — MAGNESIUM: MAGNESIUM: 1.9 mg/dL (ref 1.7–2.4)

## 2015-12-08 LAB — TSH: TSH: 1.506 u[IU]/mL (ref 0.350–4.500)

## 2015-12-08 LAB — BRAIN NATRIURETIC PEPTIDE: B Natriuretic Peptide: 673 pg/mL — ABNORMAL HIGH (ref 0.0–100.0)

## 2015-12-08 MED ORDER — FUROSEMIDE 10 MG/ML IJ SOLN
20.0000 mg | Freq: Two times a day (BID) | INTRAMUSCULAR | Status: DC
  Administered 2015-12-08 – 2015-12-09 (×2): 20 mg via INTRAVENOUS
  Filled 2015-12-08 (×2): qty 2

## 2015-12-08 MED ORDER — ENOXAPARIN SODIUM 40 MG/0.4ML ~~LOC~~ SOLN
40.0000 mg | SUBCUTANEOUS | Status: DC
  Filled 2015-12-08: qty 0.4

## 2015-12-08 MED ORDER — ACETAMINOPHEN 650 MG RE SUPP: 650.0000 mg | Freq: Four times a day (QID) | RECTAL | Status: DC | PRN

## 2015-12-08 MED ORDER — DILTIAZEM HCL 60 MG PO TABS
60.0000 mg | ORAL_TABLET | Freq: Three times a day (TID) | ORAL | Status: DC
  Administered 2015-12-08 – 2015-12-09 (×3): 60 mg via ORAL
  Filled 2015-12-08 (×3): qty 1

## 2015-12-08 MED ORDER — APIXABAN 2.5 MG PO TABS
2.5000 mg | ORAL_TABLET | Freq: Two times a day (BID) | ORAL | Status: DC
  Administered 2015-12-08 – 2015-12-09 (×3): 2.5 mg via ORAL
  Filled 2015-12-08 (×3): qty 1

## 2015-12-08 MED ORDER — ONDANSETRON HCL 4 MG PO TABS: 4.0000 mg | ORAL_TABLET | Freq: Four times a day (QID) | ORAL | Status: DC | PRN

## 2015-12-08 MED ORDER — CLARITHROMYCIN 500 MG PO TABS
500.0000 mg | ORAL_TABLET | Freq: Two times a day (BID) | ORAL | Status: DC
  Administered 2015-12-08 – 2015-12-09 (×4): 500 mg via ORAL
  Filled 2015-12-08 (×6): qty 1

## 2015-12-08 MED ORDER — AMOXICILLIN 500 MG PO CAPS
1000.0000 mg | ORAL_CAPSULE | Freq: Two times a day (BID) | ORAL | Status: DC
  Administered 2015-12-08 – 2015-12-09 (×4): 1000 mg via ORAL
  Filled 2015-12-08 (×7): qty 2

## 2015-12-08 MED ORDER — DILTIAZEM HCL 25 MG/5ML IV SOLN
10.0000 mg | Freq: Once | INTRAVENOUS | Status: AC
  Administered 2015-12-08: 10 mg via INTRAVENOUS
  Filled 2015-12-08: qty 5

## 2015-12-08 MED ORDER — PANTOPRAZOLE SODIUM 40 MG PO TBEC
40.0000 mg | DELAYED_RELEASE_TABLET | Freq: Two times a day (BID) | ORAL | Status: DC
  Administered 2015-12-08 – 2015-12-09 (×4): 40 mg via ORAL
  Filled 2015-12-08 (×4): qty 1

## 2015-12-08 MED ORDER — ASPIRIN 81 MG PO CHEW
81.0000 mg | CHEWABLE_TABLET | Freq: Every day | ORAL | Status: DC
  Filled 2015-12-08: qty 1

## 2015-12-08 MED ORDER — FUROSEMIDE 10 MG/ML IJ SOLN
40.0000 mg | Freq: Once | INTRAMUSCULAR | Status: AC
  Administered 2015-12-08: 40 mg via INTRAVENOUS
  Filled 2015-12-08: qty 4

## 2015-12-08 MED ORDER — ONDANSETRON HCL 4 MG/2ML IJ SOLN: 4.0000 mg | Freq: Four times a day (QID) | INTRAMUSCULAR | Status: DC | PRN

## 2015-12-08 MED ORDER — ADULT MULTIVITAMIN W/MINERALS CH
1.0000 | ORAL_TABLET | Freq: Every day | ORAL | Status: DC
  Administered 2015-12-08 – 2015-12-09 (×2): 1 via ORAL
  Filled 2015-12-08 (×2): qty 1

## 2015-12-08 MED ORDER — APIXABAN 5 MG PO TABS
5.0000 mg | ORAL_TABLET | Freq: Two times a day (BID) | ORAL | Status: DC
Start: 2015-12-08 — End: 2015-12-08

## 2015-12-08 MED ORDER — ACETAMINOPHEN 325 MG PO TABS: 650.0000 mg | ORAL_TABLET | Freq: Four times a day (QID) | ORAL | Status: DC | PRN

## 2015-12-08 MED ORDER — DILTIAZEM HCL 25 MG/5ML IV SOLN
5.0000 mg | Freq: Once | INTRAVENOUS | Status: AC
  Administered 2015-12-08: 5 mg via INTRAVENOUS

## 2015-12-08 MED ORDER — DILTIAZEM HCL 30 MG PO TABS: 30.0000 mg | ORAL_TABLET | Freq: Three times a day (TID) | ORAL | Status: DC

## 2015-12-08 MED ORDER — DILTIAZEM HCL 30 MG PO TABS
30.0000 mg | ORAL_TABLET | Freq: Once | ORAL | Status: AC
  Administered 2015-12-08: 30 mg via ORAL
  Filled 2015-12-08: qty 1

## 2015-12-08 MED ORDER — SENNOSIDES-DOCUSATE SODIUM 8.6-50 MG PO TABS: 1.0000 | ORAL_TABLET | Freq: Every evening | ORAL | Status: DC | PRN

## 2015-12-08 NOTE — Progress Notes (Signed)
Anticoagulation monitoring  55 yo male currently on clarithromycin for H pylori treatment ordered to start apixaban for AFib. MD order for apixaban 5 mg PO BID. Will adjust to apixaban 2.5 mg PO BID per hospital policy due to drug interaction. Once pt is finished with clarithromycin dose may need to be adjusted back.

## 2015-12-08 NOTE — Consult Note (Signed)
Patient ID: Walter Banks MRN: [id number] DOB/AGE: [birth year] 55 y.o.  Admit date: 12/08/2015 Referring Physician: Allena Katz Primary Cardiologist: New (Will be [facility] at [facility]) Reason for Consultation: CHF, atrial fibrillation with RVR  HPI: 55 yo male with no chronic medical problems with recent treatment for pneumonia and H. Pylori who is admitted with weakness, dyspnea and orthopnea and is found to be in atrial fibrillation with RVR. He notes 6 week history of fatigue, weakness, dyspnea. He has had a 10 lb weight gain during this time with increasing lower extremity edema. He is not aware of palpitations. He was found to be in atrial fib with RVR with HR 140s in the ED. Given IV Cardizem bolus x 2 and then started on po Cardizem 30 mg TID. Chest x-ray with evidence of pulmonary edema. He has been given IV Lasix and has diuresed well. No chest pain. No dizziness, near syncope or syncope.    Past Medical History  Diagnosis Date  . Pneumonia   . H. pylori infection     Family History  Problem Relation Age of Onset  . Hypertension Father   . Cancer Neg Hx   . Diabetes Neg Hx   . Stroke Neg Hx   . Heart attack Father     Social History   Social History  . Marital Status: Single    Spouse Name: N/A  . Number of Children: N/A  . Years of Education: N/A   Occupational History  . 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 Smoking status: Never Smoker   . Smokeless tobacco: Never Used  . Alcohol Use: 4.8 oz/week    8 Cans of beer per week     Comment: occasional  . Drug Use: No  . Sexual Activity: Yes   Other Topics Concern  . Not on file   Social History Narrative    Past Surgical History  Procedure Laterality Date  . Wrist surgery Left 2013    No Known Allergies  Prior to Admission medications   Medication Sig Start Date End Date Taking? Authorizing Provider  amoxicillin (AMOXIL) 500 MG capsule Take 2 capsules (1,000 mg total)  by mouth 2 (two) times daily. 12/05/15  Yes Lorre Munroe, NP  clarithromycin (BIAXIN) 500 MG tablet Take 1 tablet (500 mg total) by mouth 2 (two) times daily. 12/05/15  Yes Lorre Munroe, NP  multivitamin (ONE-A-DAY MEN'S) TABS tablet Take 1 tablet by mouth daily.   Yes Historical Provider, MD  pantoprazole (PROTONIX) 40 MG tablet Take 1 tablet (40 mg total) by mouth 2 (two) times daily. 12/05/15  Yes Lorre Munroe, NP   Hospital Medications:  . amoxicillin  1,000 mg Oral BID  . aspirin  81 mg Oral Daily  . clarithromycin  500 mg Oral BID  . diltiazem  30 mg Oral 3 times per day  . enoxaparin (LOVENOX) injection  40 mg Subcutaneous Q24H  . furosemide  20 mg Intravenous Q12H  . multivitamin  1 tablet Oral Daily  . pantoprazole  40 mg Oral BID   Review of systems complete and found to be negative unless listed above   Physical Exam: Blood pressure 112/71, pulse 94, temperature 97.5 F (36.4 C), temperature source Oral, resp. rate 18, height  (1.854 m), weight 243 lb (110.224 kg), SpO2 98 %.    General: Well developed, well nourished, NAD  HEENT: OP clear, mucus membranes moist  SKIN: warm, dry.  No rashes.  Neuro: No focal deficits  Musculoskeletal: Muscle strength 5/5 all ext  Psychiatric: Mood and affect normal  Neck: No JVD, no carotid bruits, no thyromegaly, no lymphadenopathy.  Lungs:Fine basilar crackles. No wheezes or rhonci Cardiovascular: Irreg irreg with no murmurs, gallops or rubs.  Abdomen:Soft. Bowel sounds present. Non-tender.  Extremities: 1+ bilateral lower extremity edema. Pulses are 2 + in the bilateral DP/PT.  Labs:   Lab Results  Component Value Date   WBC 9.5 12/08/2015   HGB 13.4 12/08/2015   HCT 40.1 12/08/2015   MCV 95.2 12/08/2015   PLT 158 12/08/2015     Recent Labs Lab 12/08/15 0337  NA 140  K 4.2  CL 110  CO2 20*  BUN 19  CREATININE 0.92  CALCIUM 8.2*  PROT 6.0*  BILITOT 1.9*  ALKPHOS 57  ALT 43  AST 47*  GLUCOSE 99   Lab  Results  Component Value Date   TROPONINI <0.03 12/08/2015    Chest x-ray:  The lungs are well-aerated. Vascular congestion is noted. Bibasilar airspace opacities raise concern for pulmonary edema. Small bilateral pleural effusions are seen. There is no evidence of pneumothorax. The cardiomediastinal silhouette is mildly enlarged. No acute osseous abnormalities are seen. IMPRESSION: Vascular congestion and mild cardiomegaly. Bibasilar airspace opacities raise concern for pulmonary edema. Small bilateral pleural effusions seen.  EKG: atrial fibrillation, rate 110 bpm, Non-specific T wave abn, Poor R wave progression precordial leads.   ASSESSMENT AND PLAN:   1. Atrial fibrillation with RVR: First documented episode although it is likely that he has been in atrial fib for several weeks given his clinical story. HR is now 100-110 bpm after IV Cardizem in ED. He has been started on Cardizem 30 mg po TID. Will increase Cardizem to 60 mg po TID.  Echo pending today. CHADSVASC score is 1 (CHF) although his CHF is acute and possibly due to his elevated rate. He will need short term anticoagulation regardless. Will start Eliquis 5mg  po BID. D/c Lovenox. If he does not convert on his own, will need DCCV in 4 weeks.   2. Acute CHF: Volume overloaded on admission and responding well to IV Lasix. Will continue IV Lasix today. Echo later today to be read by [facility] tomorrow.    Signed: Verne Carrow, MD 12/08/2015, 11:02 AM

## 2015-12-08 NOTE — ED Provider Notes (Signed)
[facility] Emergency Department Provider Note  ____________________________________________  Time seen: Approximately 405 AM  I have reviewed the triage vital signs and the nursing notes.   HISTORY  Chief Complaint Shortness of Breath    HPI Walter Banks is a 55 y.o. male who comes into the hospital today with some shortness of breath. The patient reports that he has been gasping for air. He has been in and out of his primary care physician's office he reports over the past month and he has been diagnosed both with H. pylori recently and pneumonia about a month ago. He reports that he hasn't been able to sleep in the past couple of days. He feels very short of breath when he lays down and also short of breath when he exerts himself. The patient reports that the symptoms have been going on for a month and they have not gotten any better. He reports that when he couldn't breathe one month ago he was diagnosed with pneumonia given antibiotics. The patient denies any chest pain or palpitations. He has been feeling some dizziness and lightheadedness mainly when he bends down and stands back up. The patient reports that he coughs so much that he vomited as well.   Past Medical History  Diagnosis Date  . Pneumonia     Patient Active Problem List   Diagnosis Date Noted  . Obesity (BMI 30-39.9) 03/12/2014    Past Surgical History  Procedure Laterality Date  . Wrist surgery Left 2013    Current Outpatient Rx  Name  Route  Sig  Dispense  Refill  . amoxicillin (AMOXIL) 500 MG capsule   Oral   Take 2 capsules (1,000 mg total) by mouth 2 (two) times daily.   56 capsule   0   . clarithromycin (BIAXIN) 500 MG tablet   Oral   Take 1 tablet (500 mg total) by mouth 2 (two) times daily.   28 tablet   0   . fluticasone (FLONASE) 50 MCG/ACT nasal spray      Reported on 12/02/2015      0   . multivitamin (ONE-A-DAY MEN'S) TABS tablet   Oral   Take 1  tablet by mouth daily.         . pantoprazole (PROTONIX) 40 MG tablet   Oral   Take 1 tablet (40 mg total) by mouth 2 (two) times daily.   28 tablet   0   . PROAIR HFA 108 (90 Base) MCG/ACT inhaler      Reported on 12/02/2015      0     Dispense as written.     Allergies Review of patient's allergies indicates no known allergies.  Family History  Problem Relation Age of Onset  . Hypertension Father   . Cancer Neg Hx   . Diabetes Neg Hx   . Stroke Neg Hx     Social History Social History  Substance Use Topics  . Smoking status: Never Smoker   . Smokeless tobacco: Never Used  . Alcohol Use: 4.8 oz/week    8 Cans of beer per week     Comment: occasional    Review of Systems Constitutional: No fever/chills Eyes: No visual changes. ENT: No sore throat. Cardiovascular: Denies chest pain. Respiratory: cough and shortness of breath. Gastrointestinal: No abdominal pain.  No nausea, no vomiting.  No diarrhea.  No constipation. Genitourinary: Negative for dysuria. Musculoskeletal: Negative for back pain. Skin: Negative for rash. Neurological: Negative for headaches, focal  weakness or numbness.  10-point ROS otherwise negative.  ____________________________________________   PHYSICAL EXAM:  VITAL SIGNS: ED Triage Vitals  Enc Vitals Group     BP 12/08/15 0318 127/89 mmHg     Pulse Rate 12/08/15 0318 137     Resp 12/08/15 0318 24     Temp --      Temp src --      SpO2 12/08/15 0318 94 %     Weight 12/08/15 0318 248 lb (112.492 kg)     Height 12/08/15 0318 6\' 1"  (1.854 m)     Head Cir --      Peak Flow --      Pain Score --      Pain Loc --      Pain Edu? --      Excl. in GC? --     Constitutional: Alert and oriented. Well appearing and in mild distress. Eyes: Conjunctivae are normal. PERRL. EOMI. Head: Atraumatic. Nose: No congestion/rhinnorhea. Mouth/Throat: Mucous membranes are moist.  Oropharynx non-erythematous. Cardiovascular: Irregularly  irregular rhythm with some mild intermittent tachycardia Respiratory: Normal respiratory effort.  No retractions. Crackles in bilateral bases Gastrointestinal: Soft and nontender. No distention positive bowel sounds Musculoskeletal: No lower extremity tenderness nor edema.   Neurologic:  Normal speech and language.  Skin:  Skin is warm, dry and intact.  Psychiatric: Mood and affect are normal.   ____________________________________________   LABS (all labs ordered are listed, but only abnormal results are displayed)  Labs Reviewed  CBC - Abnormal; Notable for the following:    RBC 4.21 (*)    All other components within normal limits  COMPREHENSIVE METABOLIC PANEL - Abnormal; Notable for the following:    CO2 20 (*)    Calcium 8.2 (*)    Total Protein 6.0 (*)    Albumin 3.4 (*)    AST 47 (*)    Total Bilirubin 1.9 (*)    All other components within normal limits  BRAIN NATRIURETIC PEPTIDE - Abnormal; Notable for the following:    B Natriuretic Peptide 673.0 (*)    All other components within normal limits  TROPONIN I   ____________________________________________  EKG  ED ECG REPORT I, Rebecka Apley, the attending physician, personally viewed and interpreted this ECG.   Date: 12/08/2015  EKG Time: 336  Rate: 110  Rhythm: atrial fibrillation, rate 110  Axis: normal  Intervals:none  ST&T Change: none  ____________________________________________  RADIOLOGY  Chest x-ray: Vascular congestion and mild cardiomegaly, bibasilar airspace opacities raises concern for pulmonary edema, small bilateral pleural effusions seen. ____________________________________________   PROCEDURES  Procedure(s) performed: None  Critical Care performed: No  ____________________________________________   INITIAL IMPRESSION / ASSESSMENT AND PLAN / ED COURSE  Pertinent labs & imaging results that were available during my care of the patient were reviewed by me and considered in  my medical decision making (see chart for details).  This is a 55 year old male who comes in to the hospital shortness of breath. Given his dyspnea on exertion and paroxysmal nocturnal dyspnea this worse when laying flat I was concerned about heart failure. The patient's BNP is 673 but he does have some mild pulmonary edema. The patient also has new onset atrial fibrillation. He did receive a dose of 30 mg of diltiazem as well as a dose of 10 mg of diltiazem IV. The patient's heart rate improved but I did write for a third 5 mg dose because he did still have some heart rates in  the 100s. Given the patient's pulmonary edema as well as his new onset A. fib I will admit the patient to the hospitalist service for further evaluation. The patient received a dose of Lasix.  He will be admitted to the hospitalist service for further treatment and evaluation of his pulmonary edema as well as his atrial fibrillation. ____________________________________________   FINAL CLINICAL IMPRESSION(S) / ED DIAGNOSES  Final diagnoses:  Atrial fibrillation, new onset (HCC)  Atrial fibrillation with rapid ventricular response (HCC)  Acute pulmonary edema (HCC)      Rebecka Apley, MD 12/08/15 [phone]

## 2015-12-08 NOTE — Progress Notes (Signed)
Patient admitted to unit. Oriented to room, call bell, and staff. Bed in lowest position. Fall safety plan reviewed. Full assessment to Epic. Skin assessment verified with ---Tacey Ruiz RN--. Telemetry box verification with tele clerk and Thayer Ohm NT- Box#: [id number]. No complaints at this time. Will continue to monitor.

## 2015-12-08 NOTE — H&P (Signed)
[facility] - [place] at [facility]   PATIENT NAME: Walter Banks    MR#:  [id number]  DATE OF BIRTH:  1960-11-23  DATE OF ADMISSION:  12/08/2015  PRIMARY CARE PHYSICIAN: Nicki Reaper, NP   REQUESTING/REFERRING PHYSICIAN: Dr. Zenda Alpers  CHIEF COMPLAINT:   Increasing shortness of breath, leg swelling, overall feeling weak HISTORY OF PRESENT ILLNESS:  Walter Banks  is a 55 y.o. male with a known history of H. pylori gastritis undergoing treatment for the same comes to the emergency room with increasing shortness of breath, PND, orthopnea, overall feeling weak. Patient was recently treated for possible pneumonia couple weeks ago thereafter got diagnosed with H. pylori currently is undergoing treatment for that. He started having above complaints came to the emergency room was found to be in A. fib with RVR. His heart rate was in the 150s. He received IV 10 mg of IV Cardizem thereafter received 30 mg of by mouth Cardizem and 40 mg of IV Lasix. Patient has urinated quite a bit in the emergency room. His sats are 100% on 2 L nasal Oxygen and his heart rate is down to 80-90 irregularly irregular. Blood pressure is a bit on the lower side however patient states he tends to run low blood pressure. He is being admitted for further evaluation and management on his use of new onset A. fib and CHF.  PAST MEDICAL HISTORY:   Past Medical History  Diagnosis Date  . Pneumonia     PAST SURGICAL HISTOIRY:   Past Surgical History  Procedure Laterality Date  . Wrist surgery Left 2013    SOCIAL HISTORY:   Social History  Substance Use Topics  . Smoking status: Never Smoker   . Smokeless tobacco: Never Used  . Alcohol Use: 4.8 oz/week    8 Cans of beer per week     Comment: occasional    FAMILY HISTORY:   Family History  Problem Relation Age of Onset  . Hypertension Father   . Cancer Neg Hx   . Diabetes Neg Hx   . Stroke Neg Hx     DRUG ALLERGIES:  No  Known Allergies  REVIEW OF SYSTEMS:  Review of Systems  Constitutional: Negative for fever, chills and weight loss.  HENT: Negative for ear discharge, ear pain and nosebleeds.   Eyes: Negative for blurred vision, pain and discharge.  Respiratory: Positive for shortness of breath. Negative for sputum production, wheezing and stridor.   Cardiovascular: Positive for palpitations and leg swelling. Negative for chest pain, orthopnea and PND.  Gastrointestinal: Negative for nausea, vomiting, abdominal pain and diarrhea.  Genitourinary: Negative for urgency and frequency.  Musculoskeletal: Negative for back pain and joint pain.  Neurological: Positive for weakness. Negative for sensory change, speech change and focal weakness.  Psychiatric/Behavioral: Negative for depression and hallucinations. The patient is not nervous/anxious.   All other systems reviewed and are negative.    MEDICATIONS AT HOME:   Prior to Admission medications   Medication Sig Start Date End Date Taking? Authorizing Provider  amoxicillin (AMOXIL) 500 MG capsule Take 2 capsules (1,000 mg total) by mouth 2 (two) times daily. 12/05/15  Yes Lorre Munroe, NP  clarithromycin (BIAXIN) 500 MG tablet Take 1 tablet (500 mg total) by mouth 2 (two) times daily. 12/05/15  Yes Lorre Munroe, NP  multivitamin (ONE-A-DAY MEN'S) TABS tablet Take 1 tablet by mouth daily.   Yes Historical Provider, MD  pantoprazole (PROTONIX) 40 MG tablet Take 1 tablet (40 mg  total) by mouth 2 (two) times daily. 12/05/15  Yes Lorre Munroe, NP      VITAL SIGNS:  Blood pressure 105/87, pulse 58, resp. rate 17, height 6\' 1"  (1.854 m), weight 112.492 kg (248 lb), SpO2 97 %.  PHYSICAL EXAMINATION:  GENERAL:  55 y.o.-year-old patient lying in the bed with no acute distress.  EYES: Pupils equal, round, reactive to light and accommodation. No scleral icterus. Extraocular muscles intact.  HEENT: Head atraumatic, normocephalic. Oropharynx and nasopharynx clear.   NECK:  Supple, no jugular venous distention. No thyroid enlargement, no tenderness.  LUNGS: Decreased breath sounds bilaterally, no wheezing, rales,rhonchi or crepitation. No use of accessory muscles of respiration.  CARDIOVASCULAR: S1, S2 normal. No murmurs, rubs, or gallops. Irregularly irregular heart rhythm  ABDOMEN: Soft, nontender, nondistended. Bowel sounds present. No organomegaly or mass.  EXTREMITIES: 1+ pedal edema, cyanosis, or clubbing.  NEUROLOGIC: Cranial nerves II through XII are intact. Muscle strength 5/5 in all extremities. Sensation intact. Gait not checked.  PSYCHIATRIC: The patient is alert and oriented x 3.  SKIN: No obvious rash, lesion, or ulcer.   LABORATORY PANEL:   CBC  Recent Labs Lab 12/08/15 0337  WBC 9.5  HGB 13.4  HCT 40.1  PLT 158   ------------------------------------------------------------------------------------------------------------------  Chemistries   Recent Labs Lab 12/08/15 0337  NA 140  K 4.2  CL 110  CO2 20*  GLUCOSE 99  BUN 19  CREATININE 0.92  CALCIUM 8.2*  AST 47*  ALT 43  ALKPHOS 57  BILITOT 1.9*   ------------------------------------------------------------------------------------------------------------------  Cardiac Enzymes  Recent Labs Lab 12/08/15 0337  TROPONINI <0.03   ------------------------------------------------------------------------------------------------------------------  RADIOLOGY:  Dg Chest Port 1 View  12/08/2015  CLINICAL DATA:  Acute onset of generalized weakness, shortness of breath and hot flashes. Insomnia. Initial encounter. EXAM: PORTABLE CHEST 1 VIEW COMPARISON:  Chest radiograph performed 11/16/2015 FINDINGS: The lungs are well-aerated. Vascular congestion is noted. Bibasilar airspace opacities raise concern for pulmonary edema. Small bilateral pleural effusions are seen. There is no evidence of pneumothorax. The cardiomediastinal silhouette is mildly enlarged. No acute osseous  abnormalities are seen. IMPRESSION: Vascular congestion and mild cardiomegaly. Bibasilar airspace opacities raise concern for pulmonary edema. Small bilateral pleural effusions seen. Electronically Signed   By: Roanna Raider M.D.   On: 12/08/2015 04:53    EKG:   A. fib with RVR IMPRESSION AND PLAN:   Walter Banks  is a 55 y.o. male with a known history of H. pylori gastritis undergoing treatment for the same comes to the emergency room with increasing shortness of breath, PND, orthopnea, overall feeling weak. Patient was recently treated for possible pneumonia couple weeks ago thereafter got diagnosed with H. pylori currently is undergoing treatment for that. He started having above complaints came to the emergency room was found to be in A. fib with RVR. His heart rate was in the 150s  1. A. fib with RVR new onset Admit to telemetry We'll start by mouth Cardizem 30 mg 3 times a day Cardiology consultation spoke with Dr Clifton James. Defer anticoagulation to cardiology Aspirin 81 mg daily IV Lasix 20 mg twice a day monitor I's and O's and daily weights Echo of the heart. Since blood pressures on the lower side I'll hold off on Ace inhibitor and consider starting prior to discharge once patient is stable  2. New onset congestive heart failure EF not known IV Lasix 20 mg twice a day Consider Ace inhibitors once blood pressure is stable Echo Add Coreg if blood  pressure allows  3. Ongoing treatment for H. pylori gastritis Resume home meds  4. DVT prophylaxis subcutaneous Lovenox   All the records are reviewed and case discussed with ED provider. Management plans discussed with the patient, family and they are in agreement.  CODE STATUS: Full  TOTAL CRITICAL TIME TAKING CARE OF THIS PATIENT: 50 minutes.    Mazy Culton M.D on 12/08/2015 at 8:34 AM  Between 7am to 6pm - Pager - [phone]  After 6pm go to [email or web address] - password [email or web address] [facility]  [place] [facility]  Office   [phone]  CC: Primary care physician; Nicki Reaper, NP

## 2015-12-08 NOTE — ED Notes (Signed)
Patient reports was diagnoses with pneumonia approximately a month ago.  Reports continued feeling of shortness of breath.  States he has seen his PMD since then and diagnoses with H. Pylori and took medications as instructed.  Patient reports continued weakness, shortness of breath and unable to sleep.  Patient with skin warm and dry, color within normal limits.  Patient speaking in complete sentences.  Patient appears anxious.

## 2015-12-08 NOTE — ED Notes (Signed)
Called report to 2A. Nurse unable to take report at this time. Will call back.

## 2015-12-09 ENCOUNTER — Encounter: Payer: Self-pay | Admitting: Student

## 2015-12-09 DIAGNOSIS — I5021 Acute systolic (congestive) heart failure: Secondary | ICD-10-CM

## 2015-12-09 DIAGNOSIS — I4819 Other persistent atrial fibrillation: Secondary | ICD-10-CM

## 2015-12-09 DIAGNOSIS — I4891 Unspecified atrial fibrillation: Secondary | ICD-10-CM

## 2015-12-09 LAB — APTT: aPTT: 32 seconds (ref 24–36)

## 2015-12-09 LAB — GLUCOSE, CAPILLARY: GLUCOSE-CAPILLARY: 102 mg/dL — AB (ref 65–99)

## 2015-12-09 LAB — BASIC METABOLIC PANEL
Anion gap: 8 (ref 5–15)
BUN: 19 mg/dL (ref 6–20)
CO2: 27 mmol/L (ref 22–32)
Calcium: 8.1 mg/dL — ABNORMAL LOW (ref 8.9–10.3)
Chloride: 105 mmol/L (ref 101–111)
Creatinine, Ser: 0.97 mg/dL (ref 0.61–1.24)
GFR calc Af Amer: >60 mL/min (ref >60)
GLUCOSE: 97 mg/dL (ref 65–99)
POTASSIUM: 3.7 mmol/L (ref 3.5–5.1)
Sodium: 140 mmol/L (ref 135–145)

## 2015-12-09 LAB — MRSA PCR SCREENING: MRSA BY PCR: NEGATIVE

## 2015-12-09 LAB — HEPARIN LEVEL (UNFRACTIONATED): Heparin Unfractionated: 1.68 IU/mL — ABNORMAL HIGH (ref 0.30–0.70)

## 2015-12-09 MED ORDER — DILTIAZEM HCL ER COATED BEADS 180 MG PO CP24: 180.0000 mg | ORAL_CAPSULE | Freq: Every day | ORAL | Status: DC

## 2015-12-09 MED ORDER — HEPARIN (PORCINE) IN NACL 100-0.45 UNIT/ML-% IJ SOLN
1450.0000 [IU]/h | INTRAMUSCULAR | Status: DC
  Administered 2015-12-09: 1450 [IU]/h via INTRAVENOUS
  Filled 2015-12-09 (×2): qty 250

## 2015-12-09 MED ORDER — ASPIRIN 81 MG PO CHEW
81.0000 mg | CHEWABLE_TABLET | ORAL | Status: AC
  Administered 2015-12-10: 81 mg via ORAL
  Filled 2015-12-09: qty 1

## 2015-12-09 MED ORDER — SODIUM CHLORIDE 0.9 % IV SOLN: 250.0000 mL | INTRAVENOUS | Status: DC | PRN

## 2015-12-09 MED ORDER — SODIUM CHLORIDE 0.9% FLUSH
3.0000 mL | Freq: Two times a day (BID) | INTRAVENOUS | Status: DC
  Administered 2015-12-09 (×2): 3 mL via INTRAVENOUS

## 2015-12-09 MED ORDER — METOPROLOL TARTRATE 25 MG PO TABS: 25.0000 mg | ORAL_TABLET | Freq: Two times a day (BID) | ORAL | Status: DC

## 2015-12-09 MED ORDER — SODIUM CHLORIDE 0.9 % IV BOLUS (SEPSIS)
250.0000 mL | Freq: Once | INTRAVENOUS | Status: AC
  Administered 2015-12-09: 250 mL via INTRAVENOUS

## 2015-12-09 MED ORDER — METOPROLOL TARTRATE 25 MG PO TABS
25.0000 mg | ORAL_TABLET | Freq: Two times a day (BID) | ORAL | Status: DC
  Administered 2015-12-09: 25 mg via ORAL
  Filled 2015-12-09: qty 1

## 2015-12-09 MED ORDER — SODIUM CHLORIDE 0.9% FLUSH
3.0000 mL | INTRAVENOUS | Status: DC | PRN
  Administered 2015-12-09: 3 mL via INTRAVENOUS
  Filled 2015-12-09: qty 3

## 2015-12-09 MED ORDER — METOPROLOL TARTRATE 50 MG PO TABS
50.0000 mg | ORAL_TABLET | Freq: Two times a day (BID) | ORAL | Status: DC
  Administered 2015-12-09: 50 mg via ORAL
  Filled 2015-12-09: qty 1

## 2015-12-09 MED ORDER — SODIUM CHLORIDE 0.9 % IV SOLN
INTRAVENOUS | Status: DC
  Administered 2015-12-10: 05:00:00 via INTRAVENOUS

## 2015-12-09 MED ORDER — DILTIAZEM HCL 30 MG PO TABS: 30.0000 mg | ORAL_TABLET | Freq: Four times a day (QID) | ORAL | Status: DC | PRN

## 2015-12-09 MED ORDER — DOPAMINE-DEXTROSE 3.2-5 MG/ML-% IV SOLN
5.0000 ug/kg/min | INTRAVENOUS | Status: DC
  Filled 2015-12-09: qty 250

## 2015-12-09 NOTE — Progress Notes (Signed)
Patient had a 5bt run of vtach per central tele. MD paged, awaiting call back. Patient resting in room, in NAD. Will continue to monitor.

## 2015-12-09 NOTE — Progress Notes (Signed)
Cardiology present on floor and updated of run of vtach. Prime doc paged again.

## 2015-12-09 NOTE — Progress Notes (Signed)
Initial Heart Failure Clinic appointment scheduled on December 18, 2015 at 10:00am. Thank you.

## 2015-12-09 NOTE — Progress Notes (Signed)
Patient ID: DELVECCHIO WOLBERS, male   DOB: Feb 04, 1961, 55 y.o.   MRN: [id number] [facility] - [place] at [facility]   PATIENT NAME: Deaire Clennon    MR#:  [id number]  DATE OF BIRTH:  [birth year]  SUBJECTIVE:  Feels okay. Has been urinating quite a bit. No chest pain. Shortness of breath mild. I'll appears somewhat anxious.  REVIEW OF SYSTEMS:   Review of Systems  Constitutional: Negative for fever, chills and weight loss.  HENT: Negative for ear discharge, ear pain and nosebleeds.   Eyes: Negative for blurred vision, pain and discharge.  Respiratory: Positive for shortness of breath. Negative for sputum production, wheezing and stridor.   Cardiovascular: Negative for chest pain, palpitations, orthopnea and PND.  Gastrointestinal: Negative for nausea, vomiting, abdominal pain and diarrhea.  Genitourinary: Negative for urgency and frequency.  Musculoskeletal: Negative for back pain and joint pain.  Neurological: Negative for sensory change, speech change, focal weakness and weakness.  Psychiatric/Behavioral: Negative for depression and hallucinations. The patient is not nervous/anxious.   All other systems reviewed and are negative.  Tolerating Diet: Yes  Tolerating PT: Not needed  DRUG ALLERGIES:  No Known Allergies  VITALS:  Blood pressure 75/59, pulse 60, temperature 97.7 F (36.5 C), temperature source Oral, resp. rate 18, height 6\' 1"  (1.854 m), weight 110.224 kg (243 lb), SpO2 100 %.  PHYSICAL EXAMINATION:   Physical Exam  GENERAL:  55 y.o.-year-old patient lying in the bed with no acute distress.  EYES: Pupils equal, round, reactive to light and accommodation. No scleral icterus. Extraocular muscles intact.  HEENT: Head atraumatic, normocephalic. Oropharynx and nasopharynx clear.  NECK:  Supple, no jugular venous distention. No thyroid enlargement, no tenderness.  LUNGS: Decreased breath sounds bilaterally, no wheezing, rales, rhonchi. No use  of accessory muscles of respiration.  CARDIOVASCULAR: S1, S2 normal. No murmurs, rubs, or gallops. Irregularly irregular heart rhythm ABDOMEN: Soft, nontender, nondistended. Bowel sounds present. No organomegaly or mass.  EXTREMITIES: No cyanosis, clubbing or edema b/l.    NEUROLOGIC: Cranial nerves II through XII are intact. No focal Motor or sensory deficits b/l.   PSYCHIATRIC:  patient is alert and oriented x 3.  SKIN: No obvious rash, lesion, or ulcer.   LABORATORY PANEL:  CBC  Recent Labs Lab 12/08/15 0337  WBC 9.5  HGB 13.4  HCT 40.1  PLT 158    Chemistries   Recent Labs Lab 12/08/15 0337 12/08/15 1033 12/09/15 0455  NA 140  --  140  K 4.2  --  3.7  CL 110  --  105  CO2 20*  --  27  GLUCOSE 99  --  97  BUN 19  --  19  CREATININE 0.92  --  0.97  CALCIUM 8.2*  --  8.1*  MG  --  1.9  --   AST 47*  --   --   ALT 43  --   --   ALKPHOS 57  --   --   BILITOT 1.9*  --   --    Cardiac Enzymes  Recent Labs Lab 12/08/15 2329  TROPONINI <0.03   RADIOLOGY:  Dg Chest Port 1 View  12/08/2015  CLINICAL DATA:  Acute onset of generalized weakness, shortness of breath and hot flashes. Insomnia. Initial encounter. EXAM: PORTABLE CHEST 1 VIEW COMPARISON:  Chest radiograph performed 11/16/2015 FINDINGS: The lungs are well-aerated. Vascular congestion is noted. Bibasilar airspace opacities raise concern for pulmonary edema. Small bilateral pleural effusions are seen. There is  no evidence of pneumothorax. The cardiomediastinal silhouette is mildly enlarged. No acute osseous abnormalities are seen. IMPRESSION: Vascular congestion and mild cardiomegaly. Bibasilar airspace opacities raise concern for pulmonary edema. Small bilateral pleural effusions seen. Electronically Signed   By: Roanna Raider M.D.   On: 12/08/2015 04:53   ASSESSMENT AND PLAN:   Zylen Wenig is a 55 y.o. male with a known history of H. pylori gastritis undergoing treatment for the same comes to the emergency  room with increasing shortness of breath, PND, orthopnea, overall feeling weak. Patient was recently treated for possible pneumonia couple weeks ago thereafter got diagnosed with H. pylori currently is undergoing treatment for that. He started having above complaints came to the emergency room was found to be in A. fib with RVR. His heart rate was in the 150s  1. A. fib with RVR new onset On Cardizem 30 mg 4 times a day with holding parameter Cardiology consultation spoke with Dr Blinda Leatherwood. Patient is going for cardiac catheter tomorrow. IV heparin drip for anticoagulation. Would switch him to eliquis after cardiac catheter tomorrow IV Lasix 20 mg twice a day monitor I's and O's and daily weights Echo of the heart showed severe cardiopathy with EF of 20-25%. Since blood pressures on the lower side I'll hold off on Ace inhibitor and consider starting prior to discharge once patient is stable  2. New onset congestive heart failure, acute systolic with EF of 20-25%. IV Lasix 20 mg twice a day Consider Ace inhibitors once blood pressure is stable Add Coreg if blood pressure allows Patient's blood pressure is significantly low. He received 250 IV bolus. Cardiogenic recommend IV dopamine drip. Patient will be transferred to the ICU for tubal but drip  3. Ongoing treatment for H. pylori gastritis Resume home meds  4. DVT prophylaxis subcutaneous Lovenox  Case discussed with Care Management/Social Worker. Management plans discussed with the patient, family and they are in agreement.  Full code  DVT Prophylaxis: IV heparin drip  TOTAL T critical IME TAKING CARE OF THIS PATIENT: 35 minutes.  >50% time spent on counselling and coordination of care  POSSIBLE D/C IN 2-3 DAYS, DEPENDING ON CLINICAL CONDITION.  Note: This dictation was prepared with Dragon dictation along with smaller phrase technology. Any transcriptional errors that result from this process are unintentional.  Yecheskel Kurek M.D on  12/09/2015 at 2:50 PM  Between 7am to 6pm - Pager - [phone]  After 6pm go to [email or web address] - password [email or web address] [facility]  [place] [facility]  Office  [phone]  CC: Primary care physician; Nicki Reaper, NP

## 2015-12-09 NOTE — Progress Notes (Signed)
Report called to [facility], CCU RN. Daughter-in-law, Hale Bogus updated with room number and is at bedside presently. Pt. Belongings packed and will be transported with pt to ICU 18.

## 2015-12-09 NOTE — Clinical Documentation Improvement (Signed)
Cardiology Please update your documentation within the medical record to reflect your response to this query. Thank you  Can the diagnosis of CHF be further specified?    Acuity - Acute, Chronic, Acute on Chronic   Type - Systolic, Diastolic, Systolic and Diastolic  Other  Clinically Undetermined  Document any associated diagnoses/conditions  Supporting Information: 12/09/15 progr note.Marland KitchenMarland Kitchen"Acute CHF Exacerbation - BNP elevated to 673 while in the ED - started on IV Lasix with net output of -5.5L thus far - echocardiogram is pending to further assess LV function.".Marland KitchenMarland Kitchen  Please exercise your independent, professional judgment when responding. A specific answer is not anticipated or expected.  Thank You,  Toribio Harbour, RN, BSN, CCDS Certified Clinical Documentation Specialist [facility]: Health Information Management [phone]

## 2015-12-09 NOTE — Progress Notes (Signed)
ANTICOAGULATION CONSULT NOTE - Initial Consult  Pharmacy Consult for heparin drip dosing and monitoring Indication: atrial fibrillation  No Known Allergies  Patient Measurements: Height: 6\' 1"  (185.4 cm) Weight: 243 lb (110.224 kg) IBW/kg (Calculated) : 79.9 Heparin Dosing Weight: 103 kg  Vital Signs: Temp: 97.7 F (36.5 C) (03/06 0424) Temp Source: Oral (03/06 0424) BP: 75/59 mmHg (03/06 1348) Pulse Rate: 60 (03/06 1348)  Labs:  Recent Labs  12/08/15 0337 12/08/15 1033 12/08/15 1549 12/08/15 2329 12/09/15 0455 12/09/15 1101  HGB 13.4  --   --   --   --   --   HCT 40.1  --   --   --   --   --   PLT 158  --   --   --   --   --   APTT  --   --   --   --   --  32  HEPARINUNFRC  --   --   --   --   --  1.68*  CREATININE 0.92  --   --   --  0.97  --   TROPONINI <0.03 0.04* <0.03 <0.03  --   --     Estimated Creatinine Clearance: 113.3 mL/min (by C-G formula based on Cr of 0.97).   Medical History: Past Medical History  Diagnosis Date  . Pneumonia   . H. pylori infection   . New onset atrial fibrillation (HCC) 12/2015  . Acute systolic (congestive) heart failure (HCC) 12/2015    a. 12/08/2015: echo showing EF of 20-25% w/ diffuse hypokinesis, severely dilated LA and RA, moderate MR and TR, PA peak pressure of 50 mmHg    Assessment: Pharmacy consulted to dose and monitor heparin drip in this 55 year old with new diagnosis of atrial fibrillation and heart failure. Patient was started on apixaban 2.5 mg PO BID during admission (lower dose due to DDI with H. Pylori treatment) and will be transitioned to heparin drip today in anticipation of cardiac cath.  Baseline labs: HL 1.68 APTT 32 seconds  Last dose of apixaban was at 0800 this morning  Will need to monitor/adjust using APTT   Goal of Therapy:  Heparin level 0.3-0.7 units/ml aPTT 66 - 102 seconds   Plan:  NO BOLUS Start heparin infusion at 1450 units/hr (14 units/kg/hr) to begin at 2000 tonight which is 12  hours after last apixaban dose Check APTT level in 6 hours and daily while on heparin.  Continue to monitor H&H and platelets.  Thank you for allowing pharmacy to be part of this patient's care.  Cindi Carbon, PharmD Clinical Pharmacist 12/09/2015,2:45 PM

## 2015-12-09 NOTE — Progress Notes (Signed)
Dr. Pyreddy returned paged, no new orders just to continue monitoring pt.  

## 2015-12-09 NOTE — Care Management (Signed)
Patient for cardiac cath and informed during progression that cath could not be performed for another 48 hours because Eliquis has to be held.  Per attending, patient has only had one dose Eliquis- 2.5mg .  Checking with cardiology.  Patient with new dx of heart failure with very decreased EF.

## 2015-12-09 NOTE — Progress Notes (Addendum)
Notified Dr. Kirke Corin that patient was not started on dopamine drip due to stable BP per dopamine order.  Heparin drip to start at 2000.  Patient vitals stable- No complaint of pain- no shortness of breath.  Per Dr. Kirke Corin- patient made stepdown status.

## 2015-12-09 NOTE — Progress Notes (Signed)
Hospital Problem List     Principal Problem:   New onset atrial fibrillation (HCC) Active Problems:   Acute systolic (congestive) heart failure [facility])    Patient Profile:   Primary Cardiologist: New - [facility]  55 yo male w/ PMH of recent treatment for pneumonia and H. Pylori with no prior cardiac history who presented to [facility] on 12/08/2015 with weakness, dyspnea and orthopnea for the past 4 weeks, found to be in new-onset atrial fibrillation with RVR.  Subjective   Reports dyspnea and palpitations with walking around the room. No symptoms at rest. Denies any recent chest pain or radiating pain.  Inpatient Medications    . amoxicillin  1,000 mg Oral BID  . apixaban  2.5 mg Oral BID  . clarithromycin  500 mg Oral BID  . diltiazem  60 mg Oral 3 times per day  . furosemide  20 mg Intravenous Q12H  . multivitamin with minerals  1 tablet Oral Daily  . pantoprazole  40 mg Oral BID    Vital Signs    Filed Vitals:   12/08/15 1410 12/08/15 2024 12/08/15 2349 12/09/15 0424  BP: 119/92 112/98 100/72 105/63  Pulse: 101 119 100 57  Temp:  98 F (36.7 C) 98.3 F (36.8 C) 97.7 F (36.5 C)  TempSrc:  Oral Oral Oral  Resp:  Height:      Weight:      SpO2:  100% 96% 94%    Intake/Output Summary (Last 24 hours) at 12/09/15 0932 Last data filed at 12/09/15 0424  Gross per 24 hour  Intake    720 ml  Output   4425 ml  Net  -3705 ml   Filed Weights   12/08/15 0318 12/08/15 1003  Weight: 248 lb (112.492 kg) 243 lb (110.224 kg)    Physical Exam    General: Well developed, well nourished, male appearing in no acute distress. Head: Normocephalic, atraumatic.  Neck: Supple without bruits, JVD not elevated. Lungs:  Resp regular and unlabored, CTA without wheezing or rales. Heart: Irregularly irregular, S1, S2, no S3, S4, or murmur; no rub. Abdomen: Soft, non-tender, non-distended with normoactive bowel sounds. No hepatomegaly. No rebound/guarding. No obvious  abdominal masses. Extremities: No clubbing or cyanosis. Trace edema bilaterally. Distal pedal pulses are 2+ bilaterally. Neuro: Alert and oriented X 3. Moves all extremities spontaneously. Psych: Normal affect.  Labs    CBC  Recent Labs  12/08/15 0337  WBC 9.5  HGB 13.4  HCT 40.1  MCV 95.2  PLT 158   Basic Metabolic Panel  Recent Labs  12/08/15 0337 12/08/15 1033 12/09/15 0455  NA 140  --  140  K 4.2  --  3.7  CL 110  --  105  CO2 20*  --  27  GLUCOSE 99  --  97  BUN 19  --  19  CREATININE 0.92  --  0.97  CALCIUM 8.2*  --  8.1*  MG  --  1.9  --    Liver Function Tests  Recent Labs  12/08/15 0337  AST 47*  ALT 43  ALKPHOS 57  BILITOT 1.9*  PROT 6.0*  ALBUMIN 3.4*   No results for input(s): LIPASE, AMYLASE in the last 72 hours. Cardiac Enzymes  Recent Labs  12/08/15 1033 12/08/15 1549 12/08/15 2329  TROPONINI 0.04* <0.03 <0.03   Thyroid Function Tests  Recent Labs  12/08/15 1033  TSH 1.506    Telemetry    Atrial fibrillation, HR  in 90's - 110's. Frequent PVC's. Two episodes of 3 and 5 beats NSVT.   ECG    No new tracings.   Cardiac Studies and Radiology    Dg Chest Port 1 View: 12/08/2015  CLINICAL DATA:  Acute onset of generalized weakness, shortness of breath and hot flashes. Insomnia. Initial encounter. EXAM: PORTABLE CHEST 1 VIEW COMPARISON:  Chest radiograph performed 11/16/2015 FINDINGS: The lungs are well-aerated. Vascular congestion is noted. Bibasilar airspace opacities raise concern for pulmonary edema. Small bilateral pleural effusions are seen. There is no evidence of pneumothorax. The cardiomediastinal silhouette is mildly enlarged. No acute osseous abnormalities are seen. IMPRESSION: Vascular congestion and mild cardiomegaly. Bibasilar airspace opacities raise concern for pulmonary edema. Small bilateral pleural effusions seen. Electronically Signed   By: Roanna Raider M.D.   On: 12/08/2015 04:53    Echocardiogram:  12/08/2015 Study Conclusions - Left ventricle: The cavity size was severely dilated. Wall thickness was normal. Systolic function was severely reduced. The estimated ejection fraction was in the range of 20% to 25%. Diffuse hypokinesis. - Mitral valve: There was moderate regurgitation. - Left atrium: The atrium was severely dilated. - Right ventricle: The cavity size was mildly dilated. Wall thickness was normal. Systolic function was mildly reduced. - Right atrium: The atrium was severely dilated. - Tricuspid valve: There was moderate regurgitation. - Pulmonary arteries: Systolic pressure was moderately increased. PA peak pressure: 50 mm Hg (S).  Assessment & Plan    1. New-onset Atrial fibrillation with RVR - presented with worsening dyspnea with exertion and weakness for the past 4 weeks. - found to be in atrial fibrillation w/ RVR, HR in the 150's upon arrival to the ER. - This patients CHA2DS2-VASc Score and unadjusted Ischemic Stroke Rate (% per year) is equal to 0.6 % stroke rate/year from a score of 1 (CHF). Spoke with Dr. Kirke Corin, will discontinue Eliquis at this time and initiate Heparin due to patient's reduced EF and need for RHC/LHC this admission. Will need to be off Eliquis for preferably 36 hours prior to cardiac catheterization, therefore we would anticipate this procedure on 12/11/2015. - received Cardizem bolus in the ED, and this had been converted to PO Cardizem (60mg  TID). Will transition from Cardizem to BB given his significantly reduced EF. Will initiate Lopressor 50mg  BID with titration as HR and BP will allow. Would benefit from Toprol-XL with his CHF prior to discharge. - would anticipate DCCV in 4 weeks once Eliquis has been resumed following catheterization.  2. Acute Systolic CHF Exacerbation - likely secondary to atrial fibrillation with RVR (patient reports having symptoms for at least one month). - BNP elevated to 673 while in the ED. Started on IV  Lasix with net output of -5.5L thus far. - Echocardiogram shows significantly reduced EF of 20-25% with elevated PA peak pressure of 50 mmHg. No prior studies available for comparison. Will plan for RHC/LHC this admission. - will transition from Cardizem to BB given significantly reduced EF. Can consider addition of ACE-I following titration of BB. - would continue IV Lasix for additional day given lower extremity edema. Can likely transition to PO tomorrow.  3. H.pylori - remains on Clarithromycin and Amoxicillin. Eliquis was dose adjusted by pharmacy to 2.5mg  BID due to drug interactions. Will need to address when resuming Eliquis. - per admitting team  4. NSVT - 3 to 5 beats NSVT over the past 24 hours. Denies any prior cardiac history.  - will initiate BB.  - ischemic evaluation as above.  Signed, Ellsworth Lennox , PA-C 9:32 AM 12/09/2015 Pager: [phone]

## 2015-12-09 NOTE — Progress Notes (Signed)
Filed Vitals:   12/09/15 1232 12/09/15 1310  BP: 79/56 70/53  Pulse: 52 70  Temp:    Resp:    Vital signs as above. Patient reporting dizziness and lightheadedness. Dr. Allena Katz paged and aware, asked for Dr. Kirke Corin to be paged and consulted about what to do.  Dr. Kirke Corin gave orders for 250cc bolus and recheck in 30 minutes. Will continue to assess.

## 2015-12-09 NOTE — Progress Notes (Signed)
Per AD, Walter Banks, Dopamine gtt only for renal perfusion and not hypotension. Dr. Allena Katz paged and order for tx to CCU received. Bed request placed.

## 2015-12-09 NOTE — Progress Notes (Signed)
Dr. Kirke Corin paged with post-bolus BP. RN received orders for dopamine gtt. Hospitalist paged with update. RN mentioned to [place] that central line recommended for dopamine infusion. Per MD since starting at 24mcg/kg/min, peripheral is OK for now.

## 2015-12-09 NOTE — Progress Notes (Signed)
  Went to check on the patient regarding his persistent hypotension this afternoon. BP was 79/56 at 1230 then 70/53 at 1310.  He was given a 250cc bolus with repeat BP 75/59. Dr. Kirke Corin was made aware of this and ordered a Dopamine infusion.   Upon arrival to the patient's room, he reported still being lightheaded and dizzy. I asked his nurse to check a manual BP, which also confirmed his BP was 72/50's. He was very anxious about being transferred to the ICU. I reviewed with him that he is being transferred because the medication to improve his BP and symptoms can only be given there and not on the telemetry floor. He voiced understanding of this information.   Signed, Ellsworth Lennox, PA-C 12/09/2015, 2:23 PM Pager: [phone]

## 2015-12-09 NOTE — Progress Notes (Signed)
eLink Physician-Brief Progress Note Patient Name: Walter Banks DOB: 26-Aug-1961 MRN: [id number]   Date of Service  12/09/2015  HPI/Events of Note  Patient transferred to the intensive care unit for hypotension after significant diuresis and systolic congestive heart failure new diagnosis. Patient also found to have low-grade rapid ventricular response with diuresis and hypotension.Started on dopamine per cardiology consult recommendations after gentle IV fluid bolus. Camera check on patient shows resting comfortably with normal heart rate and normal blood pressure on dopamine infusion. Patient denying any chest discomfort or difficulty breathing as time. Does report intermittent dyspnea however. Critical care nurse practitioner at bedside.Patient planned for left heart catheterization per documentation.  Currently on heparin infusion for systemic anticoagulation.  eICU Interventions  Continue current plan of care and defer to critical care consult.     Intervention Category Evaluation Type: New Patient Evaluation  Lawanda Cousins 12/09/2015, 4:11 PM

## 2015-12-09 NOTE — Progress Notes (Signed)
Patients BP low.   Filed Vitals:   12/09/15 0424 12/09/15 1202  BP: 105/63 72/52  Pulse: 57 66  Temp: 97.7 F (36.5 C)   Resp: 20 18   Dr. Allena Katz on the floor & aware. No further orders, will recheck in approx. 30 minutes. Patient asymptomatic at this time.

## 2015-12-09 NOTE — Discharge Instructions (Signed)
Heart Failure [facility] appointment on December 18, 2015 at 10:00am with Jaleigh Mccroskey, FNP. Please call [phone] to reschedule.  °

## 2015-12-09 NOTE — Progress Notes (Addendum)
Central tele reported 3 beats of wide QRS, pt assessed and in no acute distress or SOB, pt is alert and oriented. No signs or complaints of pain. Will continue to monitor pt. Prime paged.

## 2015-12-10 ENCOUNTER — Encounter: Payer: Self-pay | Admitting: Cardiovascular Disease

## 2015-12-10 ENCOUNTER — Encounter
Admission: EM | Disposition: A | Discharge status: Home / Self Care | Payer: Self-pay | Source: Home / Self Care | Attending: Internal Medicine

## 2015-12-10 DIAGNOSIS — I472 Ventricular tachycardia: Secondary | ICD-10-CM

## 2015-12-10 DIAGNOSIS — I429 Cardiomyopathy, unspecified: Secondary | ICD-10-CM

## 2015-12-10 DIAGNOSIS — I420 Dilated cardiomyopathy: Secondary | ICD-10-CM

## 2015-12-10 DIAGNOSIS — R0602 Shortness of breath: Secondary | ICD-10-CM | POA: Insufficient documentation

## 2015-12-10 DIAGNOSIS — J810 Acute pulmonary edema: Secondary | ICD-10-CM

## 2015-12-10 DIAGNOSIS — I2510 Atherosclerotic heart disease of native coronary artery without angina pectoris: Secondary | ICD-10-CM

## 2015-12-10 HISTORY — PX: CARDIAC CATHETERIZATION: SHX172

## 2015-12-10 LAB — CBC WITH DIFFERENTIAL/PLATELET
BASOS ABS: 0.1 10*3/uL (ref 0–0.1)
Basophils Relative: 1 %
EOS PCT: 1 %
Eosinophils Absolute: 0.1 10*3/uL (ref 0–0.7)
HEMATOCRIT: 37.4 % — AB (ref 40.0–52.0)
Hemoglobin: 12.6 g/dL — ABNORMAL LOW (ref 13.0–18.0)
LYMPHS ABS: 1.9 10*3/uL (ref 1.0–3.6)
LYMPHS PCT: 21 %
MCH: 32.4 pg (ref 26.0–34.0)
MCHC: 33.7 g/dL (ref 32.0–36.0)
MCV: 96 fL (ref 80.0–100.0)
MONO ABS: 1 10*3/uL (ref 0.2–1.0)
MONOS PCT: 10 %
Neutro Abs: 6.3 10*3/uL (ref 1.4–6.5)
Neutrophils Relative %: 67 %
PLATELETS: 141 10*3/uL — AB (ref 150–440)
RBC: 3.89 MIL/uL — ABNORMAL LOW (ref 4.40–5.90)
RDW: 14.3 % (ref 11.5–14.5)
WBC: 9.4 10*3/uL (ref 3.8–10.6)

## 2015-12-10 LAB — LIPID PANEL
CHOL/HDL RATIO: 5.7 ratio
Cholesterol: 181 mg/dL (ref 0–200)
HDL: 32 mg/dL — AB (ref >40)
LDL Cholesterol: 133 mg/dL — ABNORMAL HIGH (ref 0–99)
Triglycerides: 81 mg/dL (ref <150)
VLDL: 16 mg/dL (ref 0–40)

## 2015-12-10 LAB — HEPARIN LEVEL (UNFRACTIONATED): HEPARIN UNFRACTIONATED: 1.21 [IU]/mL — AB (ref 0.30–0.70)

## 2015-12-10 LAB — APTT: APTT: 70 s — AB (ref 24–36)

## 2015-12-10 LAB — HEMOGLOBIN A1C: HEMOGLOBIN A1C: 5.6 % (ref 4.0–6.0)

## 2015-12-10 SURGERY — RIGHT AND LEFT HEART CATH
Anesthesia: Moderate Sedation

## 2015-12-10 MED ORDER — ONDANSETRON HCL 4 MG/2ML IJ SOLN: 4.0000 mg | Freq: Four times a day (QID) | INTRAMUSCULAR | Status: DC | PRN

## 2015-12-10 MED ORDER — NITROGLYCERIN 2 % TD OINT: 1.0000 [in_us] | TOPICAL_OINTMENT | Freq: Four times a day (QID) | TRANSDERMAL | Status: DC

## 2015-12-10 MED ORDER — FUROSEMIDE 10 MG/ML IJ SOLN
20.0000 mg | Freq: Three times a day (TID) | INTRAMUSCULAR | Status: DC
  Administered 2015-12-10 – 2015-12-12 (×3): 20 mg via INTRAVENOUS
  Filled 2015-12-10 (×3): qty 2

## 2015-12-10 MED ORDER — SODIUM CHLORIDE 0.9 % IV SOLN: 250.0000 mL | INTRAVENOUS | Status: DC | PRN

## 2015-12-10 MED ORDER — ADULT MULTIVITAMIN W/MINERALS CH
1.0000 | ORAL_TABLET | Freq: Every day | ORAL | Status: DC
  Administered 2015-12-10 – 2015-12-13 (×4): 1 via ORAL
  Filled 2015-12-10 (×4): qty 1

## 2015-12-10 MED ORDER — METOPROLOL TARTRATE 50 MG PO TABS: 75.0000 mg | ORAL_TABLET | Freq: Three times a day (TID) | ORAL | Status: DC

## 2015-12-10 MED ORDER — ACETAMINOPHEN 325 MG PO TABS: 650.0000 mg | ORAL_TABLET | ORAL | Status: DC | PRN

## 2015-12-10 MED ORDER — APIXABAN 5 MG PO TABS
5.0000 mg | ORAL_TABLET | Freq: Two times a day (BID) | ORAL | Status: DC
  Administered 2015-12-10 – 2015-12-13 (×6): 5 mg via ORAL
  Filled 2015-12-10 (×6): qty 1

## 2015-12-10 MED ORDER — NITROGLYCERIN 2 % TD OINT
1.5000 [in_us] | TOPICAL_OINTMENT | Freq: Four times a day (QID) | TRANSDERMAL | Status: DC
  Administered 2015-12-10: 1.5 [in_us] via TOPICAL
  Filled 2015-12-10: qty 2

## 2015-12-10 MED ORDER — FENTANYL CITRATE (PF) 100 MCG/2ML IJ SOLN
INTRAMUSCULAR | Status: DC | PRN
  Administered 2015-12-10: 25 ug via INTRAVENOUS

## 2015-12-10 MED ORDER — IOHEXOL 300 MG/ML  SOLN
INTRAMUSCULAR | Status: DC | PRN
  Administered 2015-12-10: 100 mL via INTRA_ARTERIAL

## 2015-12-10 MED ORDER — MIDAZOLAM HCL 2 MG/2ML IJ SOLN
INTRAMUSCULAR | Status: AC
  Filled 2015-12-10: qty 2

## 2015-12-10 MED ORDER — SODIUM CHLORIDE 0.9% FLUSH
3.0000 mL | Freq: Two times a day (BID) | INTRAVENOUS | Status: DC
Start: 2015-12-10 — End: 2015-12-13
  Administered 2015-12-10 – 2015-12-13 (×6): 3 mL via INTRAVENOUS

## 2015-12-10 MED ORDER — METOPROLOL TARTRATE 25 MG PO TABS
25.0000 mg | ORAL_TABLET | Freq: Three times a day (TID) | ORAL | Status: DC
  Administered 2015-12-10 – 2015-12-11 (×3): 25 mg via ORAL
  Filled 2015-12-10 (×3): qty 1

## 2015-12-10 MED ORDER — PANTOPRAZOLE SODIUM 40 MG PO TBEC
40.0000 mg | DELAYED_RELEASE_TABLET | Freq: Two times a day (BID) | ORAL | Status: DC
  Administered 2015-12-10 – 2015-12-13 (×7): 40 mg via ORAL
  Filled 2015-12-10 (×7): qty 1

## 2015-12-10 MED ORDER — CLARITHROMYCIN 500 MG PO TABS
500.0000 mg | ORAL_TABLET | Freq: Two times a day (BID) | ORAL | Status: DC
  Filled 2015-12-10 (×2): qty 1

## 2015-12-10 MED ORDER — FUROSEMIDE 10 MG/ML IJ SOLN
20.0000 mg | Freq: Two times a day (BID) | INTRAMUSCULAR | Status: DC
  Administered 2015-12-10: 20 mg via INTRAVENOUS
  Filled 2015-12-10: qty 2

## 2015-12-10 MED ORDER — HEPARIN (PORCINE) IN NACL 2-0.9 UNIT/ML-% IJ SOLN
INTRAMUSCULAR | Status: AC
  Filled 2015-12-10: qty 500

## 2015-12-10 MED ORDER — LEVOFLOXACIN 500 MG PO TABS
250.0000 mg | ORAL_TABLET | Freq: Two times a day (BID) | ORAL | Status: DC
  Administered 2015-12-10 – 2015-12-13 (×7): 250 mg via ORAL
  Filled 2015-12-10 (×7): qty 1

## 2015-12-10 MED ORDER — SODIUM CHLORIDE 0.9% FLUSH
3.0000 mL | INTRAVENOUS | Status: DC | PRN
  Administered 2015-12-10: 3 mL via INTRAVENOUS
  Filled 2015-12-10: qty 3

## 2015-12-10 MED ORDER — MIDAZOLAM HCL 2 MG/2ML IJ SOLN
INTRAMUSCULAR | Status: DC | PRN
  Administered 2015-12-10 (×2): 1 mg via INTRAVENOUS

## 2015-12-10 MED ORDER — AMOXICILLIN 500 MG PO CAPS
1000.0000 mg | ORAL_CAPSULE | Freq: Two times a day (BID) | ORAL | Status: DC
  Administered 2015-12-10 – 2015-12-13 (×7): 1000 mg via ORAL
  Filled 2015-12-10 (×9): qty 2

## 2015-12-10 MED ORDER — FENTANYL CITRATE (PF) 100 MCG/2ML IJ SOLN
INTRAMUSCULAR | Status: AC
  Filled 2015-12-10: qty 2

## 2015-12-10 SURGICAL SUPPLY — 14 items
CATH INFINITI 5FR ANG PIGTAIL (CATHETERS) ×3 IMPLANT
CATH INFINITI 5FR JL4 (CATHETERS) ×3 IMPLANT
CATH INFINITI JR4 5F (CATHETERS) ×3 IMPLANT
CATH SWANZ 7F THERMO (CATHETERS) ×3 IMPLANT
DEVICE CLOSURE MYNXGRIP 5F (Vascular Products) ×3 IMPLANT
GUIDEWIRE EMER 3M J .025X150CM (WIRE) ×3 IMPLANT
KIT MANI 3VAL PERCEP (MISCELLANEOUS) ×3 IMPLANT
KIT RIGHT HEART (MISCELLANEOUS) ×3 IMPLANT
NEEDLE PERC 18GX7CM (NEEDLE) ×3 IMPLANT
NEEDLE SMART 18G ACCESS (NEEDLE) IMPLANT
PACK CARDIAC CATH (CUSTOM PROCEDURE TRAY) ×3 IMPLANT
SHEATH AVANTI 5FR X 11CM (SHEATH) ×3 IMPLANT
SHEATH PINNACLE 7F 10CM (SHEATH) ×3 IMPLANT
WIRE EMERALD 3MM-J .035X150CM (WIRE) ×3 IMPLANT

## 2015-12-10 NOTE — Progress Notes (Signed)
Walter Banks is a 55 yo male currently being treated for Hpylori (3/7 is day 6). Pt has current orders for triple therapy with amoxicillin, clarithromycin, and pantoprazole.   Pt is being initiated on apixaban for new onset Afib, which interacts with clarithromycin (would require a dose reduction to apixaban 2.5mg  PO BID).   Changed clarithromycin to levaquin 250mg  PO BID per UpToDate alternative recommendations, which does not require apixaban dose adjustment, allowing this patient to receive his full anticoagulation dose.  Patient should receive 14 days of therapy.  Roque Cash, PharmD Pharmacy Resident 12/10/2015

## 2015-12-10 NOTE — Progress Notes (Signed)
Check right groin for bleeding or hematoma.  Patient will be on bedrest for 2 hours post sheath pull---out of bed at  11:30.  Bilateral pulses are 2's DP's.Marland Kitchen

## 2015-12-10 NOTE — Consult Note (Signed)
Pt counseled on apixaban for new onset Afib.

## 2015-12-10 NOTE — Progress Notes (Signed)
Patient: Walter Banks / Admit Date: 12/08/2015 / Date of Encounter: 12/10/2015, 9:39 AM   Subjective: SOB, improving, Cardiac cath with mild proximal LAD disease,  Depressed EF1. New-onset Atrial fibrillation with RVR - presented with worsening dyspnea with exertion and weakness for the past 4 weeks. - found to be in atrial fibrillation w/ RVR, HR in the 150's upon arrival to the ER. - This patients CHA2DS2-VASc Score and unadjusted Ischemic Stroke Rate (% per year) is equal to 0.6 % stroke rate/year from a score of 1 (CHF). Spoke with Dr. Kirke Corin, will discontinue Eliquis at this time and initiate Heparin due to patient's reduced EF and need for RHC/LHC this admission. Will need to be off Eliquis for preferably 36 hours prior to cardiac catheterization, therefore we would anticipate this procedure on 12/11/2015. - received Cardizem bolus in the ED, and this had been converted to PO Cardizem (60mg  TID). Will transition from Cardizem to BB given his significantly reduced EF. Will initiate Lopressor 50mg  BID with titration as HR and BP will allow. Would benefit from Toprol-XL with his CHF prior to discharge. - would anticipate DCCV in 4 weeks once Eliquis has been resumed following catheterization.  2. Acute Systolic CHF Exacerbation - likely secondary to atrial fibrillation with RVR (patient reports having symptoms for at least one month). - BNP elevated to 673 while in the ED. Started on IV Lasix with net output of -5.5L thus far. - Echocardiogram shows significantly reduced EF of 20-25% with elevated PA peak pressure of 50 mmHg. No prior studies available for comparison. Will plan for RHC/LHC this admission. - will transition from Cardizem to BB given significantly reduced EF. Can consider addition of ACE-I following titration of BB. - would continue IV Lasix for additional day given lower extremity edema. Can likely transition to PO tomorrow.  3. H.pylori - remains on Clarithromycin and  Amoxicillin. Eliquis was dose adjusted by pharmacy to 2.5mg  BID due to drug interactions. Will need to address when resuming Eliquis. - per admitting team  4. NSVT - 3 to 5 beats NSVT over the past 24 hours. Denies any prior cardiac history.  - will initiate BB.  - ischemic evaluation as above.  improved in past 24 hours   Review of Systems: Review of Systems  Constitutional: Negative.   Respiratory: Positive for shortness of breath.   Cardiovascular: Negative.   Gastrointestinal: Negative.   Musculoskeletal: Negative.   Neurological: Negative.   Psychiatric/Behavioral: Negative.   All other systems reviewed and are negative.   Objective: Telemetry: Atrial fibrillation Physical Exam: Blood pressure 107/83, pulse 95, temperature 97.9 F (36.6 C), temperature source Oral, resp. rate 25, height 6\' 1"  (1.854 m), weight 242 lb 8.1 oz (110 kg), SpO2 96 %. Body mass index is 32 kg/(m^2). General: Well developed, well nourished, in no acute distress. Head: Normocephalic, atraumatic, sclera non-icteric, no xanthomas, nares are without discharge. Neck: Negative for carotid bruits. JVP not elevated. Lungs: Clear bilaterally to auscultation without wheezes, rales, or rhonchi. Breathing is unlabored. Heart: RRR S1 S2 without murmurs, rubs, or gallops.  Abdomen: Soft, non-tender, non-distended with normoactive bowel sounds. No rebound/guarding. Extremities: No clubbing or cyanosis. No edema. Distal pedal pulses are 2+ and equal bilaterally. Neuro: Alert and oriented X 3. Moves all extremities spontaneously. Psych:  Responds to questions appropriately with a normal affect.   Intake/Output Summary (Last 24 hours) at 12/10/15 0939 Last data filed at 12/10/15 0519  Gross per 24 hour  Intake 372.68 ml  Output   1700 ml  Net -1327.32 ml    Inpatient Medications:  . [MAR Hold] amoxicillin  1,000 mg Oral BID  . [MAR Hold] clarithromycin  500 mg Oral BID  . [MAR Hold] metoprolol tartrate   25 mg Oral BID  . [MAR Hold] multivitamin with minerals  1 tablet Oral Daily  . [MAR Hold] pantoprazole  40 mg Oral BID  . sodium chloride flush  3 mL Intravenous Q12H   Infusions:  . sodium chloride 10 mL/hr at 12/10/15 0525  . [MAR Hold] DOPamine    . heparin Stopped (12/10/15 0715)    Labs:  Recent Labs  12/08/15 0337 12/08/15 1033 12/09/15 0455  NA 140  --  140  K 4.2  --  3.7  CL 110  --  105  CO2 20*  --  27  GLUCOSE 99  --  97  BUN 19  --  19  CREATININE 0.92  --  0.97  CALCIUM 8.2*  --  8.1*  MG  --  1.9  --     Recent Labs  12/08/15 0337  AST 47*  ALT 43  ALKPHOS 57  BILITOT 1.9*  PROT 6.0*  ALBUMIN 3.4*    Recent Labs  12/08/15 0337 12/10/15 0157  WBC 9.5 9.4  NEUTROABS  --  6.3  HGB 13.4 12.6*  HCT 40.1 37.4*  MCV 95.2 96.0  PLT 158 141*    Recent Labs  12/08/15 0337 12/08/15 1033 12/08/15 1549 12/08/15 2329  TROPONINI <0.03 0.04* <0.03 <0.03   Invalid input(s): POCBNP  Recent Labs  12/10/15 0157  HGBA1C 5.6     Weights: Filed Weights   12/08/15 0318 12/08/15 1003 12/09/15 1529  Weight: 248 lb (112.492 kg) 243 lb (110.224 kg) 242 lb 8.1 oz (110 kg)     Radiology/Studies:  Dg Chest 2 View  11/16/2015  CLINICAL DATA:  Recent diagnosis of pneumonia with improved symptoms EXAM: CHEST  2 VIEW COMPARISON:  None. FINDINGS: Cardiac shadow is mildly enlarged. The lungs are well aerated bilaterally. No sizable effusion is noted. Curvilinear increased density is noted in the left mid lung and perihilar region projecting in the left upper lobe on the lateral projection IMPRESSION: Persistent infiltrative density in the left upper lobe. Continued follow-up is recommended. Electronically Signed   By: Alcide Clever M.D.   On: 11/16/2015 17:35   Dg Abd 1 View  12/04/2015  CLINICAL DATA:  Abdominal distention and bloating for 1 week. EXAM: ABDOMEN - 1 VIEW COMPARISON:  None. FINDINGS: Bowel gas pattern is normal. No significant osseous  abnormality. Phleboliths in the right side of the pelvis. IMPRESSION: Benign appearing abdomen. No distended bowel. No excessive air in the bowel. Electronically Signed   By: Francene Boyers M.D.   On: 12/04/2015 13:54   Dg Chest Port 1 View  12/08/2015  CLINICAL DATA:  Acute onset of generalized weakness, shortness of breath and hot flashes. Insomnia. Initial encounter. EXAM: PORTABLE CHEST 1 VIEW COMPARISON:  Chest radiograph performed 11/16/2015 FINDINGS: The lungs are well-aerated. Vascular congestion is noted. Bibasilar airspace opacities raise concern for pulmonary edema. Small bilateral pleural effusions are seen. There is no evidence of pneumothorax. The cardiomediastinal silhouette is mildly enlarged. No acute osseous abnormalities are seen. IMPRESSION: Vascular congestion and mild cardiomegaly. Bibasilar airspace opacities raise concern for pulmonary edema. Small bilateral pleural effusions seen. Electronically Signed   By: Roanna Raider M.D.   On: 12/08/2015 04:53     Assessment and Plan  55 y.o. male   1. New-onset Atrial fibrillation with RVR Would restart beta blocker after cardiac catheterization for rate control This evening would start eliquis 5 mg twice a day Outpatient cardioversion in 4 weeks time  2. Acute Systolic CHF Exacerbation Nonischemic cardiomyopathy by catheterization Still with significant left and right heart pressures requiring continue diuresis Would restart IV Lasix If unable to tolerate secondary to hypotension, could use dopamine If no significant urine output, may need dobutamine and dopamine (would have to hold beta blockers at that time)  3. H.pylori - remains on Clarithromycin and Amoxicillin. Eliquis was dose adjusted by pharmacy to 2.5mg  BID due to drug interactions. Will need to address when resuming Eliquis.  4. NSVT We'll continue to monitor telemetry for arrhythmia Continue beta blocker   Signed, Dossie Arbour, MD, Ph.D. [facility] 12/10/2015, 9:39 AM

## 2015-12-10 NOTE — Care Management (Signed)
Provided patient with 30 day  and 10 dollar copay Eliquis coupons printed off the web; ID [id number] and [phone].  Verbalized he does have pharmacy coverage with his insurance

## 2015-12-10 NOTE — Progress Notes (Signed)
Patient ID: Walter Banks, male   DOB: Nov 08, 1960, 55 y.o.   MRN: [id number] [facility] -  at [facility]   PATIENT NAME: Walter Banks    MR#:  [id number]  DATE OF BIRTH:  05-08-1961  SUBJECTIVE:  Feels okay. Has been urinating quite a bit. No chest pain. Shortness of breath mild.  Gets tachycardic with activity. Heart rate anywhere 92 120s A. fib  REVIEW OF SYSTEMS:   Review of Systems  Constitutional: Negative for fever, chills and weight loss.  HENT: Negative for ear discharge, ear pain and nosebleeds.   Eyes: Negative for blurred vision, pain and discharge.  Respiratory: Positive for shortness of breath. Negative for sputum production, wheezing and stridor.   Cardiovascular: Negative for chest pain, palpitations, orthopnea and PND.  Gastrointestinal: Negative for nausea, vomiting, abdominal pain and diarrhea.  Genitourinary: Negative for urgency and frequency.  Musculoskeletal: Negative for back pain and joint pain.  Neurological: Negative for sensory change, speech change, focal weakness and weakness.  Psychiatric/Behavioral: Negative for depression and hallucinations. The patient is not nervous/anxious.   All other systems reviewed and are negative.  Tolerating Diet: Yes  Tolerating PT: Not needed  DRUG ALLERGIES:  No Known Allergies  VITALS:  Blood pressure 123/102, pulse 130, temperature 97.3 F (36.3 C), temperature source Oral, resp. rate 27, height 6\' 1"  (1.854 m), weight 110 kg (242 lb 8.1 oz), SpO2 100 %.  PHYSICAL EXAMINATION:   Physical Exam  GENERAL:  55 y.o.-year-old patient lying in the bed with no acute distress.  EYES: Pupils equal, round, reactive to light and accommodation. No scleral icterus. Extraocular muscles intact.  HEENT: Head atraumatic, normocephalic. Oropharynx and nasopharynx clear.  NECK:  Supple, no jugular venous distention. No thyroid enlargement, no tenderness.  LUNGS: Decreased breath sounds  bilaterally, no wheezing, rales, rhonchi. No use of accessory muscles of respiration.  CARDIOVASCULAR: S1, S2 normal. No murmurs, rubs, or gallops. Irregularly irregular heart rhythm ABDOMEN: Soft, nontender, nondistended. Bowel sounds present. No organomegaly or mass.  EXTREMITIES: No cyanosis, clubbing or edema b/l.    NEUROLOGIC: Cranial nerves II through XII are intact. No focal Motor or sensory deficits b/l.   PSYCHIATRIC:  patient is alert and oriented x 3.  SKIN: No obvious rash, lesion, or ulcer.   LABORATORY PANEL:  CBC  Recent Labs Lab 12/10/15 0157  WBC 9.4  HGB 12.6*  HCT 37.4*  PLT 141*    Chemistries   Recent Labs Lab 12/08/15 0337 12/08/15 1033 12/09/15 0455  NA 140  --  140  K 4.2  --  3.7  CL 110  --  105  CO2 20*  --  27  GLUCOSE 99  --  97  BUN 19  --  19  CREATININE 0.92  --  0.97  CALCIUM 8.2*  --  8.1*  MG  --  1.9  --   AST 47*  --   --   ALT 43  --   --   ALKPHOS 57  --   --   BILITOT 1.9*  --   --    Cardiac Enzymes  Recent Labs Lab 12/08/15 2329  TROPONINI <0.03   RADIOLOGY:  No results found. ASSESSMENT AND PLAN:   Walter Banks is a 55 y.o. male with a known history of H. pylori gastritis undergoing treatment for the same comes to the emergency room with increasing shortness of breath, PND, orthopnea, overall feeling weak. Patient was recently treated for possible pneumonia couple  weeks ago thereafter got diagnosed with H. pylori currently is undergoing treatment for that. He started having above complaints came to the emergency room was found to be in A. fib with RVR. His heart rate was in the 150s  1. A. fib with RVR new onset On metoprolol 25 mg 3 times a day  -Cardiology consultation appreciated - Cardiac cath showed severe cardiac myopathy with EF of 20%. No CAD  -now on by mouth eliquis  ivLasix 20 mg twice a day monitor I's and O's and daily weights Echo of the heart showed severe cardiopathy with EF of 20-25%. Since  blood pressures on the lower side I'll hold off on Ace inhibitor and consider starting prior to discharge once patient is stable  2. New onset congestive heart failure, acute systolic with EF of 20-25%. IV Lasix 20 mg twice a day Consider Ace inhibitors once blood pressure is stacontinue metoprolol 25 mg 3 times a day  patient may need dopamine drip if blood pressure continues to drop   3. Ongoing treatment for H. pylori gastritis Resume home meds  4. DVT prophylaxis subcutaneous Lovenox  Case discussed with Care Management/Social Worker. Management plans discussed with the patient, family and they are in agreement.  Full code  DVT Prophylaxis:eliquis  TOTAL T critical IME TAKING CARE OF THIS PATIENT: 55 minutes.  >50% time spent on counselling and coordination of care  POSSIBLE D/C IN 2-3 DAYS, DEPENDING ON CLINICAL CONDITION.  Note: This dictation was prepared with Dragon dictation along with smaller phrase technology. Any transcriptional errors that result from this process are unintentional.  Panagiotis Oelkers M.D on 12/10/2015 at 2:56 PM  Between 7am to 6pm - Pager - [phone]  After 6pm go to [email or web address] - password [email or web address] [facility]  [place] [facility]  Office  [phone]  CC: Primary care physician; Nicki Reaper, NP

## 2015-12-10 NOTE — Progress Notes (Signed)
ANTICOAGULATION CONSULT NOTE - Initial Consult  Pharmacy Consult for heparin drip dosing and monitoring Indication: atrial fibrillation  No Known Allergies  Patient Measurements: Height: 6\' 1"  (185.4 cm) Weight: 242 lb 8.1 oz (110 kg) IBW/kg (Calculated) : 79.9 Heparin Dosing Weight: 103 kg  Vital Signs: Temp: 97.1 F (36.2 C) (03/07 0000) Temp Source: Axillary (03/07 0000) BP: 107/82 mmHg (03/07 0200) Pulse Rate: 30 (03/07 0200)  Labs:  Recent Labs  12/08/15 0337 12/08/15 1033 12/08/15 1549 12/08/15 2329 12/09/15 0455 12/09/15 1101 12/10/15 0157  HGB 13.4  --   --   --   --   --  12.6*  HCT 40.1  --   --   --   --   --  37.4*  PLT 158  --   --   --   --   --  141*  APTT  --   --   --   --   --  32 70*  HEPARINUNFRC  --   --   --   --   --  1.68*  --   CREATININE 0.92  --   --   --  0.97  --   --   TROPONINI <0.03 0.04* <0.03 <0.03  --   --   --     Estimated Creatinine Clearance: 113.2 mL/min (by C-G formula based on Cr of 0.97).   Medical History: Past Medical History  Diagnosis Date  . Pneumonia   . H. pylori infection   . New onset atrial fibrillation (HCC) 12/2015  . Acute systolic (congestive) heart failure (HCC) 12/2015    a. 12/08/2015: echo showing EF of 20-25% w/ diffuse hypokinesis, severely dilated LA and RA, moderate MR and TR, PA peak pressure of 50 mmHg    Assessment: Pharmacy consulted to dose and monitor heparin drip in this 55 year old with new diagnosis of atrial fibrillation and heart failure. Patient was started on apixaban 2.5 mg PO BID during admission (lower dose due to DDI with H. Pylori treatment) and will be transitioned to heparin drip today in anticipation of cardiac cath.  Baseline labs: HL 1.68 APTT 32 seconds  Last dose of apixaban was at 0800 this morning  Will need to monitor/adjust using APTT   Goal of Therapy:  Heparin level 0.3-0.7 units/ml aPTT 66 - 102 seconds   Plan:  NO BOLUS Start heparin infusion at 1450  units/hr (14 units/kg/hr) to begin at 2000 tonight which is 12 hours after last apixaban dose Check APTT level in 6 hours and daily while on heparin.  Continue to monitor H&H and platelets.  3/7 0200 aPTT 70. Continue current regimen and recheck with heparin level tomorrow AM.  Thank you for allowing pharmacy to be part of this patient's care.  Erich Montane, PharmD Clinical Pharmacist 12/10/2015,3:47 AM

## 2015-12-11 DIAGNOSIS — I4891 Unspecified atrial fibrillation: Secondary | ICD-10-CM | POA: Insufficient documentation

## 2015-12-11 DIAGNOSIS — R0602 Shortness of breath: Secondary | ICD-10-CM

## 2015-12-11 DIAGNOSIS — I428 Other cardiomyopathies: Secondary | ICD-10-CM

## 2015-12-11 DIAGNOSIS — J810 Acute pulmonary edema: Secondary | ICD-10-CM

## 2015-12-11 LAB — BASIC METABOLIC PANEL
ANION GAP: 11 (ref 5–15)
BUN: 18 mg/dL (ref 6–20)
CALCIUM: 8.3 mg/dL — AB (ref 8.9–10.3)
CHLORIDE: 104 mmol/L (ref 101–111)
CO2: 22 mmol/L (ref 22–32)
CREATININE: 0.86 mg/dL (ref 0.61–1.24)
GFR calc non Af Amer: >60 mL/min (ref >60)
Glucose, Bld: 116 mg/dL — ABNORMAL HIGH (ref 65–99)
Potassium: 3.8 mmol/L (ref 3.5–5.1)
SODIUM: 137 mmol/L (ref 135–145)

## 2015-12-11 LAB — APTT: aPTT: 36 seconds (ref 24–36)

## 2015-12-11 MED ORDER — DIGOXIN 250 MCG PO TABS
0.2500 mg | ORAL_TABLET | Freq: Every day | ORAL | Status: DC
  Administered 2015-12-12 – 2015-12-13 (×2): 0.25 mg via ORAL
  Filled 2015-12-11 (×2): qty 1

## 2015-12-11 MED ORDER — FUROSEMIDE 10 MG/ML IJ SOLN
40.0000 mg | Freq: Once | INTRAMUSCULAR | Status: AC
  Administered 2015-12-11: 40 mg via INTRAVENOUS

## 2015-12-11 MED ORDER — DIGOXIN 0.25 MG/ML IJ SOLN
INTRAMUSCULAR | Status: AC
  Administered 2015-12-11: 0.5 mg via INTRAVENOUS
  Filled 2015-12-11: qty 2

## 2015-12-11 MED ORDER — METOPROLOL TARTRATE 50 MG PO TABS
50.0000 mg | ORAL_TABLET | Freq: Two times a day (BID) | ORAL | Status: DC
  Administered 2015-12-11: 50 mg via ORAL
  Filled 2015-12-11 (×2): qty 1

## 2015-12-11 MED ORDER — DIGOXIN 0.25 MG/ML IJ SOLN
0.5000 mg | Freq: Once | INTRAMUSCULAR | Status: AC
  Administered 2015-12-11: 0.5 mg via INTRAVENOUS

## 2015-12-11 MED ORDER — FUROSEMIDE 10 MG/ML IJ SOLN
INTRAMUSCULAR | Status: AC
  Administered 2015-12-11: 40 mg via INTRAVENOUS
  Filled 2015-12-11: qty 4

## 2015-12-11 MED ORDER — DILTIAZEM HCL 25 MG/5ML IV SOLN
10.0000 mg | Freq: Once | INTRAVENOUS | Status: AC
  Administered 2015-12-11: 10 mg via INTRAVENOUS
  Filled 2015-12-11: qty 5

## 2015-12-11 NOTE — Progress Notes (Signed)
Patient ID: Walter Banks, male   DOB: [birth year], 55 y.o.   MRN: [id number] [facility] - [place] at [facility]   PATIENT NAME: Walter Banks    MR#:  [id number]  DATE OF BIRTH:  [birth year]  SUBJECTIVE:  Feels okay. Has been urinating quite a bit. No chest pain. Shortness of breath mild.  Heart rate currently 87-95  REVIEW OF SYSTEMS:   Review of Systems  Constitutional: Negative for fever, chills and weight loss.  HENT: Negative for ear discharge, ear pain and nosebleeds.   Eyes: Negative for blurred vision, pain and discharge.  Respiratory: Positive for shortness of breath. Negative for sputum production, wheezing and stridor.   Cardiovascular: Negative for chest pain, palpitations, orthopnea and PND.  Gastrointestinal: Negative for nausea, vomiting, abdominal pain and diarrhea.  Genitourinary: Negative for urgency and frequency.  Musculoskeletal: Negative for back pain and joint pain.  Neurological: Negative for sensory change, speech change, focal weakness and weakness.  Psychiatric/Behavioral: Negative for depression and hallucinations. The patient is not nervous/anxious.   All other systems reviewed and are negative.  Tolerating Diet: Yes  Tolerating PT: Not needed  DRUG ALLERGIES:  No Known Allergies  VITALS:  Blood pressure 116/92, pulse 98, temperature 97.1 F (36.2 C), temperature source Oral, resp. rate 24, height  (1.854 m), weight 110 kg (242 lb 8.1 oz), SpO2 97 %.  PHYSICAL EXAMINATION:   Physical Exam  GENERAL:  55 y.o.-year-old patient lying in the bed with no acute distress.  EYES: Pupils equal, round, reactive to light and accommodation. No scleral icterus. Extraocular muscles intact.  HEENT: Head atraumatic, normocephalic. Oropharynx and nasopharynx clear.  NECK:  Supple, no jugular venous distention. No thyroid enlargement, no tenderness.  LUNGS: Decreased breath sounds bilaterally, no wheezing, rales, rhonchi. No use  of accessory muscles of respiration.  CARDIOVASCULAR: S1, S2 normal. No murmurs, rubs, or gallops. Irregularly irregular heart rhythm ABDOMEN: Soft, nontender, nondistended. Bowel sounds present. No organomegaly or mass.  EXTREMITIES: No cyanosis, clubbing or edema b/l.    NEUROLOGIC: Cranial nerves II through XII are intact. No focal Motor or sensory deficits b/l.   PSYCHIATRIC:  patient is alert and oriented x 3.  SKIN: No obvious rash, lesion, or ulcer.   LABORATORY PANEL:  CBC  Recent Labs Lab 12/10/15 0157  WBC 9.4  HGB 12.6*  HCT 37.4*  PLT 141*    Chemistries   Recent Labs Lab 12/08/15 0337 12/08/15 1033 12/09/15 0455  NA 140  --  140  K 4.2  --  3.7  CL 110  --  105  CO2 20*  --  27  GLUCOSE 99  --  97  BUN 19  --  19  CREATININE 0.92  --  0.97  CALCIUM 8.2*  --  8.1*  MG  --  1.9  --   AST 47*  --   --   ALT 43  --   --   ALKPHOS 57  --   --   BILITOT 1.9*  --   --    Cardiac Enzymes  Recent Labs Lab 12/08/15 2329  TROPONINI <0.03   RADIOLOGY:  No results found. ASSESSMENT AND PLAN:   Walter Banks is a 55 y.o. male with a known history of H. pylori gastritis undergoing treatment for the same comes to the emergency room with increasing shortness of breath, PND, orthopnea, overall feeling weak. Patient was recently treated for possible pneumonia couple weeks ago thereafter got diagnosed with H.  pylori currently is undergoing treatment for that. He started having above complaints came to the emergency room was found to be in A. fib with RVR. His heart rate was in the 150s  1. A. fib with RVR new onset On metoprolol 25 mg 3 times a day  -Cardiology consultation appreciated - Cardiac cath showed severe cardiac myopathy with EF of 20%. No CAD  -now on  eliquis  ivLasix 20 mg tid a day monitor I's and O's and daily weights -Urine output 10 L since admission Echo of the heart showed severe cardiopathy with EF of 20-25%. Since blood pressures on the  lower side I'll hold off on Ace inhibitor and consider starting prior to discharge once patient is stable  2. New onset congestive heart failure, acute systolic with EF of 20-25%. IV Lasix 20 mg twice a day Consider Ace inhibitors once blood pressure is stable continue metoprolol 25 mg 3 times a day  patient did not need dopamine drip so far  3. Ongoing treatment for H. pylori gastritis Patient's currently on amoxicillin and Levaquin and Protonix  4. DVT prophylaxis po eliquis   Transfer patient 2 a Case discussed with Care Management/Social Worker. Management plans discussed with the patient, family and they are in agreement.  Full code  DVT Prophylaxis:eliquis  TOTAL  IME TAKING CARE OF THIS PATIENT: 35 minutes.  >50% time spent on counselling and coordination of care  POSSIBLE D/C IN 2-3 DAYS, DEPENDING ON CLINICAL CONDITION.  Note: This dictation was prepared with Dragon dictation along with smaller phrase technology. Any transcriptional errors that result from this process are unintentional.  Walter Banks M.D on 12/11/2015 at 7:49 AM  Between 7am to 6pm - Pager - [phone]  After 6pm go to [email or web address] - password [email or web address] [facility]  [place] [facility]  Office  [phone]  CC: Primary care physician; Nicki Reaper, NP

## 2015-12-11 NOTE — Progress Notes (Signed)
Notified Dr. Mack Hook of heart rate going into 150s AFIB. 1 time dose of cardizem ordered and given. A few minutes later notified Dr. Mack Hook of 39 beat run of Vtach. Patient stated he felt some tightness in chest for a few minutes, otherwise asymptomatic.

## 2015-12-11 NOTE — Progress Notes (Signed)
Patient Name: Walter Banks Date of Encounter: 12/11/2015  Hospital Problem List     Principal Problem:   New onset atrial fibrillation [facility]) Active Problems:   Acute systolic (congestive) heart failure (HCC)   Acute pulmonary edema (HCC)   Congestive dilated cardiomyopathy (HCC)   NICM (nonischemic cardiomyopathy) (HCC)   Obesity (BMI 30-39.9)   Subjective   Feels well.  Breathing continues to improve.  No chest pain.  Remains in Afib - rates 90's to low 100's.  Inpatient Medications    . amoxicillin  1,000 mg Oral Q12H  . apixaban  5 mg Oral BID  . furosemide  20 mg Intravenous 3 times per day  . levofloxacin  250 mg Oral BID  . metoprolol tartrate  25 mg Oral TID  . multivitamin with minerals  1 tablet Oral Daily  . pantoprazole  40 mg Oral BID  . sodium chloride flush  3 mL Intravenous Q12H    Vital Signs    Filed Vitals:   12/11/15 0700 12/11/15 0800 12/11/15 0900 12/11/15 1000  BP: 112/97 111/95 117/91 117/84  Pulse: 59 111 101 61  Temp:  98.4 F (36.9 C)    TempSrc:  Oral    Resp: 22 23 24 22   Height:      Weight:      SpO2: 96% 97% 97% 97%    Intake/Output Summary (Last 24 hours) at 12/11/15 1210 Last data filed at 12/11/15 1000  Gross per 24 hour  Intake    360 ml  Output   4125 ml  Net  -3765 ml   Filed Weights   12/08/15 0318 12/08/15 1003 12/09/15 1529  Weight: 248 lb (112.492 kg) 243 lb (110.224 kg) 242 lb 8.1 oz (110 kg)    Physical Exam    General: Pleasant, NAD. Neuro: Alert and oriented X 3. Moves all extremities spontaneously. Psych: Normal affect. HEENT:  Normal  Neck: Supple without bruits or JVD. Lungs:  Resp regular and unlabored, bibasilar crackles. Heart: IR, IR no s3, s4, or murmurs. Abdomen: semi-firm, non-tender, BS + x 4.  Extremities: No clubbing, cyanosis.  Trace bimalleolar edema. DP/PT/Radials 2+ and equal bilaterally. R groin cath site w/o bleeding/bruit/hematoma.  Labs    CBC  Recent Labs  12/10/15 0157   WBC 9.4  NEUTROABS 6.3  HGB 12.6*  HCT 37.4*  MCV 96.0  PLT 141*   Basic Metabolic Panel  Recent Labs  12/09/15 0455  NA 140  K 3.7  CL 105  CO2 27  GLUCOSE 97  BUN 19  CREATININE 0.97  CALCIUM 8.1*   Cardiac Enzymes  Recent Labs  12/08/15 1549 12/08/15 2329  TROPONINI <0.03 <0.03   Hemoglobin A1C  Recent Labs  12/10/15 0157  HGBA1C 5.6   Fasting Lipid Panel  Recent Labs  12/10/15 0157  CHOL 181  HDL 32*  LDLCALC 133*  TRIG 81  CHOLHDL 5.7    Telemetry    AFib 90's to low 100's - 7 beats NSVT overnight.  Radiology    Dg Abd 1 View  12/04/2015  CLINICAL DATA:  Abdominal distention and bloating for 1 week. EXAM: ABDOMEN - 1 VIEW COMPARISON:  None. FINDINGS: Bowel gas pattern is normal. No significant osseous abnormality. Phleboliths in the right side of the pelvis. IMPRESSION: Benign appearing abdomen. No distended bowel. No excessive air in the bowel. Electronically Signed   By: Francene Boyers M.D.   On: 12/04/2015 13:54   Dg Chest [facility]  12/08/2015  CLINICAL DATA:  Acute onset of generalized weakness, shortness of breath and hot flashes. Insomnia. Initial encounter. EXAM: PORTABLE CHEST 1 VIEW COMPARISON:  Chest radiograph performed 11/16/2015 FINDINGS: The lungs are well-aerated. Vascular congestion is noted. Bibasilar airspace opacities raise concern for pulmonary edema. Small bilateral pleural effusions are seen. There is no evidence of pneumothorax. The cardiomediastinal silhouette is mildly enlarged. No acute osseous abnormalities are seen. IMPRESSION: Vascular congestion and mild cardiomegaly. Bibasilar airspace opacities raise concern for pulmonary edema. Small bilateral pleural effusions seen. Electronically Signed   By: Roanna Raider M.D.   On: 12/08/2015 04:53    Assessment & Plan    1. Afib RVR: New onset this admission though pt says he has had palpitations for several wks with documentation of tachycardia @ primary clinic visits w/o  ECGs (HR in ED 2/11 was 62;  115 on 2/21, 126 on 2/27, 114 on 3/1). Rate currently reasonable - < 110 bpm. -Cont rate control w/  blocker therapy and plan for DCCV following 4 wks of anticoagulation with eliquis, which was resumed last night.  If he has recurrent CHF despite rate control or if rate becomes difficult to control, plan TEE and DCCV sooner. -Of note - sev dil LA in setting of mod MR and LV dysfxn on echo.  Likelihood of maintaining sinus likely low in the absence of antiarrhythmic therapy.  2.  Acute Systolic CHF/NICM (likely tachycardia mediated): Pt presented with CHF in the setting of AF RVR.   Echo this admission revealed EF of 20-25% with sev LV dil and diff HK. Cath 3/7 revealed mod LAD dzs with otw nl cors and elevated right heart pressures. Minus 4L overnight and 10.8L since admission. Wt not recorded in 2 days.  He says dry weight is in the 230's. -Daily wts. -Cont IV lasix today (renal fxn stable). Hopefully can change to PO tomorrow. -Cont  blocker and consolidate to BID dosing. -No ACEI/ARB/ARNI/Spiro given soft BP's.  3.  Moderate Mitral Regurgitation: In setting of AFib RVR and LV dysfxn. -Follow as outpt.  4.  NSVT: 7 beats WCT overnight. -Cont  blocker.  5.  H. Pylori gastritis -Abx per IM.  Signed, Nicolasa Ducking NP

## 2015-12-12 ENCOUNTER — Telehealth: Payer: Self-pay

## 2015-12-12 DIAGNOSIS — I472 Ventricular tachycardia, unspecified: Secondary | ICD-10-CM

## 2015-12-12 LAB — BASIC METABOLIC PANEL
ANION GAP: 9 (ref 5–15)
BUN: 21 mg/dL — AB (ref 6–20)
CALCIUM: 9.1 mg/dL (ref 8.9–10.3)
CO2: 31 mmol/L (ref 22–32)
Chloride: 98 mmol/L — ABNORMAL LOW (ref 101–111)
Creatinine, Ser: 1.06 mg/dL (ref 0.61–1.24)
GFR calc Af Amer: >60 mL/min (ref >60)
GFR calc non Af Amer: >60 mL/min (ref >60)
GLUCOSE: 87 mg/dL (ref 65–99)
Potassium: 4 mmol/L (ref 3.5–5.1)
Sodium: 138 mmol/L (ref 135–145)

## 2015-12-12 LAB — MAGNESIUM: Magnesium: 2 mg/dL (ref 1.7–2.4)

## 2015-12-12 MED ORDER — FUROSEMIDE 40 MG PO TABS
40.0000 mg | ORAL_TABLET | Freq: Every day | ORAL | Status: DC
  Administered 2015-12-12: 40 mg via ORAL
  Filled 2015-12-12: qty 1

## 2015-12-12 MED ORDER — FUROSEMIDE 40 MG PO TABS
40.0000 mg | ORAL_TABLET | Freq: Two times a day (BID) | ORAL | Status: DC
  Administered 2015-12-12: 40 mg via ORAL
  Filled 2015-12-12: qty 1

## 2015-12-12 MED ORDER — LEVOFLOXACIN 250 MG PO TABS: 250.0000 mg | ORAL_TABLET | Freq: Two times a day (BID) | ORAL | Status: DC

## 2015-12-12 MED ORDER — APIXABAN 5 MG PO TABS: 5.0000 mg | ORAL_TABLET | Freq: Two times a day (BID) | ORAL | Status: DC

## 2015-12-12 MED ORDER — METOPROLOL TARTRATE 25 MG PO TABS
75.0000 mg | ORAL_TABLET | Freq: Two times a day (BID) | ORAL | Status: DC
  Administered 2015-12-12 (×2): 75 mg via ORAL
  Filled 2015-12-12 (×2): qty 3

## 2015-12-12 MED ORDER — FUROSEMIDE 40 MG PO TABS: 40.0000 mg | ORAL_TABLET | Freq: Every day | ORAL | Status: DC

## 2015-12-12 MED ORDER — DIGOXIN 250 MCG PO TABS: 0.2500 mg | ORAL_TABLET | Freq: Every day | ORAL | Status: DC

## 2015-12-12 MED ORDER — METOPROLOL TARTRATE 50 MG PO TABS: 50.0000 mg | ORAL_TABLET | Freq: Two times a day (BID) | ORAL | Status: DC

## 2015-12-12 NOTE — Plan of Care (Signed)
Problem: Food- and Nutrition-Related Knowledge Deficit (NB-1.1) Goal: Nutrition education Formal process to instruct or train a patient/client in a skill or to impart knowledge to help patients/clients voluntarily manage or modify food choices and eating behavior to maintain or improve health. Outcome: Completed/Met Date Met:  12/12/15 Nutrition Education Note  RD consulted for nutrition education regarding new onset CHF.  RD provided "Low Sodium Nutrition Therapy" handout from the Academy of [employer] and Dietetics. Reviewed patient's dietary recall. Provided examples on ways to decrease sodium intake in diet. Discouraged intake of processed foods and use of salt shaker. Encouraged fresh fruits and vegetables as well as whole grain sources of carbohydrates to maximize fiber intake.   RD discussed why it is important for patient to adhere to diet recommendations, and emphasized the role of fluids, foods to avoid, and importance of weighing self daily. Teach back method used.  Expect good compliance.  Pt being discharged today, all questions answered, pt aware of appointment with [facility] and plans to purchase a new scale at discharge; discussed with [place], [place], [place] [phone] Pager  [phone] Weekend/On-Call Pager

## 2015-12-12 NOTE — Telephone Encounter (Signed)
Patient contacted regarding discharge from [facility] on 12/12/15. Pt reports that he was sched for d/c today, but they are delaying this until tomorrow, as his HR jumps when he gets OOB.  Patient understands to follow up with Eula Listen, PA on 12/06/15 at 1:30 at [facility]. Patient understands discharge instructions? yes Patient understands medications and regiment? yes Patient understands to bring all medications to this visit? yes  Asked pt to call me when he gets home if he has any further questions or concerns about his medications and/or discharge instructions.

## 2015-12-12 NOTE — Telephone Encounter (Signed)
-----   Message from Coralee Rud sent at 12/12/2015 10:26 AM EST ----- Regarding: tcm/ph 3/30/ 1:30 Eula Listen, PA

## 2015-12-12 NOTE — Discharge Summary (Addendum)
[facility] Physicians - [place] at [facility]   PATIENT NAME: Walter Banks    MR#:  [id number]  DATE OF BIRTH:  08-29-1961  DATE OF ADMISSION:  12/08/2015 ADMITTING PHYSICIAN: Enedina Finner, MD  DATE OF DISCHARGE: *12/12/15  PRIMARY CARE PHYSICIAN: Nicki Reaper, NP    ADMISSION DIAGNOSIS:  Shortness of breath [R06.02] Acute pulmonary edema (HCC) [J81.0] Atrial fibrillation with rapid ventricular response (HCC) [I48.91] Atrial fibrillation, new onset (HCC) [I48.91]  DISCHARGE DIAGNOSIS:  Acute systolic CHF Acute AFIB with RVR Severe cardiomyopathy Ongoing treatment for H pylori(7 days left)  SECONDARY DIAGNOSIS:   Past Medical History  Diagnosis Date  . Pneumonia   . H. pylori infection   . New onset atrial fibrillation (HCC) 12/2015  . Acute systolic (congestive) heart failure (HCC) 12/2015    a. 12/08/2015: echo showing EF of 20-25% w/ diffuse hypokinesis, severely dilated LA and RA, moderate MR and TR, PA peak pressure of 50 mmHg    HOSPITAL COURSE:  Walter Banks is a 55 y.o. male with a known history of H. pylori gastritis undergoing treatment for the same comes to the emergency room with increasing shortness of breath, PND, orthopnea, overall feeling weak. Patient was recently treated for possible pneumonia couple weeks ago thereafter got diagnosed with H. pylori currently is undergoing treatment for that. He started having above complaints came to the emergency room was found to be in A. fib with RVR. His heart rate was in the 150s  1. A. fib with RVR new onset On metoprolol 25 mg 3 times a day  -Cardiology consultation appreciated - Cardiac cath showed severe cardiac myopathy with EF of 20%. No CAD  -now on eliquis  ivLasix 20 mg tid a day monitor I's and O's and daily weights---po lasix 40 mg -Urine output 14 L since admission Echo of the heart showed severe cardiopathy with EF of 20-25%. Since blood pressures on the lower side will hold off on  Ace inhibitor and spironolactone -out pt TEE with CV --defer to cardiology  2. New onset congestive heart failure, acute systolic with EF of 20-25%. - IV Lasix  Changed to po 40 mg daily continue metoprolol 25 mg 3 times a day patient did not need dopamine drip so far  3. Ongoing treatment for H. pylori gastritis Patient's currently on amoxicillin and Levaquin and Protonix (7days left)  4. DVT prophylaxis po eliquis   D/c to home later today Dietitian to see Pt has appt at CHF clinic CONSULTS OBTAINED:  Treatment Team:  Kathleene Hazel, MD  DRUG ALLERGIES:  No Known Allergies  DISCHARGE MEDICATIONS:   Current Discharge Medication List    START taking these medications   Details  apixaban (ELIQUIS) 5 MG TABS tablet Take 1 tablet (5 mg total) by mouth 2 (two) times daily. Qty: 60 tablet, Refills: 3    digoxin (LANOXIN) 0.25 MG tablet Take 1 tablet (0.25 mg total) by mouth daily. Qty: 30 tablet, Refills: 3    furosemide (LASIX) 40 MG tablet Take 1 tablet (40 mg total) by mouth daily. Qty: 30 tablet, Refills: 3    levofloxacin (LEVAQUIN) 250 MG tablet Take 1 tablet (250 mg total) by mouth 2 (two) times daily. Qty: 14 tablet, Refills: 0    metoprolol (LOPRESSOR) 50 MG tablet Take 1 tablet (50 mg total) by mouth 2 (two) times daily. Qty: 60 tablet, Refills: 3      CONTINUE these medications which have NOT CHANGED   Details  amoxicillin (AMOXIL)  500 MG capsule Take 2 capsules (1,000 mg total) by mouth 2 (two) times daily. Qty: 56 capsule, Refills: 0    multivitamin (ONE-A-DAY MEN'S) TABS tablet Take 1 tablet by mouth daily.    pantoprazole (PROTONIX) 40 MG tablet Take 1 tablet (40 mg total) by mouth 2 (two) times daily. Qty: 28 tablet, Refills: 0      STOP taking these medications     clarithromycin (BIAXIN) 500 MG tablet         If you experience worsening of your admission symptoms, develop shortness of breath, life threatening emergency, suicidal or  homicidal thoughts you must seek medical attention immediately by calling 911 or calling your MD immediately  if symptoms less severe.  You Must read complete instructions/literature along with all the possible adverse reactions/side effects for all the Medicines you take and that have been prescribed to you. Take any new Medicines after you have completely understood and accept all the possible adverse reactions/side effects.   Please note  You were cared for by a hospitalist during your hospital stay. If you have any questions about your discharge medications or the care you received while you were in the hospital after you are discharged, you can call the unit and asked to speak with the hospitalist on call if the hospitalist that took care of you is not available. Once you are discharged, your primary care physician will handle any further medical issues. Please note that NO REFILLS for any discharge medications will be authorized once you are discharged, as it is imperative that you return to your primary care physician (or establish a relationship with a primary care physician if you do not have one) for your aftercare needs so that they can reassess your need for medications and monitor your lab values. Today   SUBJECTIVE     VITAL SIGNS:  Blood pressure 126/74, pulse 108, temperature 98.1 F (36.7 C), temperature source Oral, resp. rate 16, height 6\' 1"  (1.854 m), weight 101.47 kg (223 lb 11.2 oz), SpO2 98 %.  I/O:   Intake/Output Summary (Last 24 hours) at 12/12/15 0740 Last data filed at 12/12/15 0500  Gross per 24 hour  Intake   1400 ml  Output   4550 ml  Net  -3150 ml    PHYSICAL EXAMINATION:  GENERAL:  55 y.o.-year-old patient lying in the bed with no acute distress.  EYES: Pupils equal, round, reactive to light and accommodation. No scleral icterus. Extraocular muscles intact.  HEENT: Head atraumatic, normocephalic. Oropharynx and nasopharynx clear.  NECK:  Supple, no  jugular venous distention. No thyroid enlargement, no tenderness.  LUNGS: Normal breath sounds bilaterally, no wheezing, rales,rhonchi or crepitation. No use of accessory muscles of respiration.  CARDIOVASCULAR: S1, S2 normal. No murmurs, rubs, or gallops. afib ABDOMEN: Soft, non-tender, non-distended. Bowel sounds present. No organomegaly or mass.  EXTREMITIES: No pedal edema, cyanosis, or clubbing.  NEUROLOGIC: Cranial nerves II through XII are intact. Muscle strength 5/5 in all extremities. Sensation intact. Gait not checked.  PSYCHIATRIC:  patient is alert and oriented x 3.  SKIN: No obvious rash, lesion, or ulcer.   DATA REVIEW:   CBC   Recent Labs Lab 12/10/15 0157  WBC 9.4  HGB 12.6*  HCT 37.4*  PLT 141*    Chemistries   Recent Labs Lab 12/08/15 0337 12/08/15 1033  12/11/15 1339  NA 140  --   < > 137  K 4.2  --   < > 3.8  CL 110  --   < >  104  CO2 20*  --   < > 22  GLUCOSE 99  --   < > 116*  BUN 19  --   < > 18  CREATININE 0.92  --   < > 0.86  CALCIUM 8.2*  --   < > 8.3*  MG  --  1.9  --   --   AST 47*  --   --   --   ALT 43  --   --   --   ALKPHOS 57  --   --   --   BILITOT 1.9*  --   --   --   < > = values in this interval not displayed.  Microbiology Results   Recent Results (from the past 240 hour(s))  MRSA PCR Screening     Status: None   Collection Time: 12/09/15  3:42 PM  Result Value Ref Range Status   MRSA by PCR NEGATIVE NEGATIVE Final    Comment:        The GeneXpert MRSA Assay (FDA approved for NASAL specimens only), is one component of a comprehensive MRSA colonization surveillance program. It is not intended to diagnose MRSA infection nor to guide or monitor treatment for MRSA infections.     RADIOLOGY:  No results found.   Management plans discussed with the patient, family and they are in agreement.  CODE STATUS:     Code Status Orders        Start     Ordered   12/08/15 1004  Full code   Continuous     12/08/15  1003    Code Status History    Date Active Date Inactive Code Status Order ID Comments User Context   This patient has a current code status but no historical code status.      TOTAL TIME TAKING CARE OF THIS PATIENT: 40 minutes.    Veora Fonte M.D on 12/13/2015  Between 7am to 6pm - Pager - [phone] After 6pm go to [email or web address] - password [email or web address] [facility]  [place]  [facility]  Office  [phone]  CC: Primary care physician; Nicki Reaper, NP

## 2015-12-12 NOTE — Progress Notes (Signed)
Transferred from ICU. A&O. Independent. Tele box verified with Trey Paula. HR has been stable since receiving IV diltiazem except for the 39 beat run of Vtach.  Doctor aware.

## 2015-12-12 NOTE — Progress Notes (Signed)
Patient Name: Walter Banks Date of Encounter: 12/12/2015  Hospital Problem List     Principal Problem:   New onset atrial fibrillation [facility]) Active Problems:   Acute systolic (congestive) heart failure (HCC)   Acute pulmonary edema (HCC)   Congestive dilated cardiomyopathy (HCC)   NICM (nonischemic cardiomyopathy) (HCC)   VT (ventricular tachycardia) (HCC)   Obesity (BMI 30-39.9)    Subjective   Breathing much better.  Remains if AF, rates in the 80's on  blocker and digoxin.  Had 38 beats of VT last night.  He felt flushed when this occurred.  Has not recurred. Was planned for d/c today but will keep to watch for further VT while we titrate  blocker.  Inpatient Medications    . amoxicillin  1,000 mg Oral Q12H  . apixaban  5 mg Oral BID  . digoxin  0.25 mg Oral Daily  . furosemide  40 mg Oral Daily  . levofloxacin  250 mg Oral BID  . metoprolol tartrate  75 mg Oral BID  . multivitamin with minerals  1 tablet Oral Daily  . pantoprazole  40 mg Oral BID  . sodium chloride flush  3 mL Intravenous Q12H    Vital Signs    Filed Vitals:   12/11/15 1800 12/11/15 1922 12/12/15 0607 12/12/15 1053  BP: 125/84 119/80 126/74 113/77  Pulse: 102 100 108 80  Temp:  97.7 F (36.5 C) 98.1 F (36.7 C)   TempSrc:  Oral Oral   Resp: Height:      Weight:   223 lb 11.2 oz (101.47 kg)   SpO2: 100% 95% 98%     Intake/Output Summary (Last 24 hours) at 12/12/15 1056 Last data filed at 12/12/15 0800  Gross per 24 hour  Intake   1040 ml  Output   5000 ml  Net  -3960 ml   Filed Weights   12/08/15 1003 12/09/15 1529 12/12/15 0607  Weight: 243 lb (110.224 kg) 242 lb 8.1 oz (110 kg) 223 lb 11.2 oz (101.47 kg)    Physical Exam    General: Pleasant, NAD. Neuro: Alert and oriented X 3. Moves all extremities spontaneously. Psych: Normal affect. HEENT:  Normal  Neck: Supple without bruits or JVD. Lungs:  Resp regular and unlabored, CTA. Heart: IR, IR no s3, s4, or  murmurs. Abdomen: Soft, non-tender, non-distended, BS + x 4.  Extremities: No clubbing, cyanosis or edema. DP/PT/Radials 2+ and equal bilaterally.  Labs    CBC  Recent Labs  12/10/15 0157  WBC 9.4  NEUTROABS 6.3  HGB 12.6*  HCT 37.4*  MCV 96.0  PLT 141*   Basic Metabolic Panel  Recent Labs  12/11/15 1339  NA 137  K 3.8  CL 104  CO2 22  GLUCOSE 116*  BUN 18  CREATININE 0.86  CALCIUM 8.3*   Hemoglobin A1C  Recent Labs  12/10/15 0157  HGBA1C 5.6   Fasting Lipid Panel  Recent Labs  12/10/15 0157  CHOL 181  HDL 32*  LDLCALC 133*  TRIG 81  CHOLHDL 5.7    Telemetry    Afib, 80's, 38 beats VT @ 21:14 lat night.  Radiology    Dg Chest 2 View  11/16/2015  CLINICAL DATA:  Recent diagnosis of pneumonia with improved symptoms EXAM: CHEST  2 VIEW COMPARISON:  None. FINDINGS: Cardiac shadow is mildly enlarged. The lungs are well aerated bilaterally. No sizable effusion is noted. Curvilinear increased density is noted in the left  mid lung and perihilar region projecting in the left upper lobe on the lateral projection IMPRESSION: Persistent infiltrative density in the left upper lobe. Continued follow-up is recommended. Electronically Signed   By: Alcide Clever M.D.   On: 11/16/2015 17:35   Dg Abd 1 View  12/04/2015  CLINICAL DATA:  Abdominal distention and bloating for 1 week. EXAM: ABDOMEN - 1 VIEW COMPARISON:  None. FINDINGS: Bowel gas pattern is normal. No significant osseous abnormality. Phleboliths in the right side of the pelvis. IMPRESSION: Benign appearing abdomen. No distended bowel. No excessive air in the bowel. Electronically Signed   By: Francene Boyers M.D.   On: 12/04/2015 13:54   Dg Chest Port 1 View  12/08/2015  CLINICAL DATA:  Acute onset of generalized weakness, shortness of breath and hot flashes. Insomnia. Initial encounter. EXAM: PORTABLE CHEST 1 VIEW COMPARISON:  Chest radiograph performed 11/16/2015 FINDINGS: The lungs are well-aerated. Vascular  congestion is noted. Bibasilar airspace opacities raise concern for pulmonary edema. Small bilateral pleural effusions are seen. There is no evidence of pneumothorax. The cardiomediastinal silhouette is mildly enlarged. No acute osseous abnormalities are seen. IMPRESSION: Vascular congestion and mild cardiomegaly. Bibasilar airspace opacities raise concern for pulmonary edema. Small bilateral pleural effusions seen. Electronically Signed   By: Roanna Raider M.D.   On: 12/08/2015 04:53    Assessment & Plan    1. Afib RVR: New onset this admission though pt says he has had palpitations for several wks with documentation of tachycardia @ primary clinic visits w/o ECGs (HR in ED 2/11 was 62; 115 on 2/21, 126 on 2/27, 114 on 3/1). Rate currently reasonable - 80's overnight following titration of  blocker and addition of digoxin. -Cont rate control w/  blocker therapy and plan for DCCV following 4 wks of anticoagulation with eliquis, which was resumed 3/7.  -Will push  blocker to 75 bid today in light of VT last night. -If he has recurrent CHF despite rate control or if rate becomes difficult to control, plan TEE and DCCV sooner as outpt. -Of note - sev dil LA in setting of mod MR and LV dysfxn on echo. Likelihood of maintaining sinus likely low in the absence of antiarrhythmic therapy.  2. Ventricular Tachycardia/NSVT: He had previously had NSVT but last night he had 38 beats of VT, which was symptomatic (flushing). -F/U K/Mg this AM. -In light of LV dysfxn and symptomatic VT, recommend titrating  blocker to 75 bid this AM and watching on tele for another day.  Prefer not to add amio as utility in NICM/VT not clear and he would also then be @ risk of converting to sinus w/o adequate course of oral anticoagulation.  3.  Acute Systolic CHF/NICM (likely tachycardia mediated): Pt presented with CHF in the setting of AF RVR.  Echo this admission revealed EF of 20-25% with sev LV dil and diff  HK. Cath 3/7 revealed mod LAD dzs with otw nl cors and elevated right heart pressures. Minus 3.1L overnight and 14.8L since admission. Wt down to 223. He says dry weight prev in the 230's. Convert to lasix 40 po bid. -F/U BMET /Mg. -Cont and titrate  blocker. -No ACEI/ARB/ARNI/Spiro given soft BP's.  4. Moderate Mitral Regurgitation: In setting of AFib RVR and LV dysfxn. -Follow as outpt.  5. H. Pylori gastritis -Abx per IM.  Signed, Nicolasa Ducking NP

## 2015-12-12 NOTE — Care Management (Signed)
For discharge today.  confirmed that patient has his eliquis coupons.

## 2015-12-12 NOTE — Care Management (Signed)
Cardiology recommends extended stay due to arrhythmia on telemetry overnight.  Adjusting cardiac meds

## 2015-12-13 LAB — BASIC METABOLIC PANEL
Anion gap: 9 (ref 5–15)
BUN: 23 mg/dL — AB (ref 6–20)
CALCIUM: 8.8 mg/dL — AB (ref 8.9–10.3)
CO2: 29 mmol/L (ref 22–32)
CREATININE: 1.05 mg/dL (ref 0.61–1.24)
Chloride: 100 mmol/L — ABNORMAL LOW (ref 101–111)
GFR calc Af Amer: >60 mL/min (ref >60)
GLUCOSE: 110 mg/dL — AB (ref 65–99)
Potassium: 4 mmol/L (ref 3.5–5.1)
SODIUM: 138 mmol/L (ref 135–145)

## 2015-12-13 LAB — DIGOXIN LEVEL: Digoxin Level: 0.6 ng/mL — ABNORMAL LOW (ref 0.8–2.0)

## 2015-12-13 MED ORDER — FUROSEMIDE 40 MG PO TABS: 40.0000 mg | ORAL_TABLET | Freq: Every day | ORAL | Status: DC

## 2015-12-13 MED ORDER — METOPROLOL TARTRATE 100 MG PO TABS
100.0000 mg | ORAL_TABLET | Freq: Two times a day (BID) | ORAL | Status: DC
  Administered 2015-12-13: 100 mg via ORAL
  Filled 2015-12-13: qty 1

## 2015-12-13 MED ORDER — METOPROLOL TARTRATE 100 MG PO TABS: 100.0000 mg | ORAL_TABLET | Freq: Two times a day (BID) | ORAL | Status: DC

## 2015-12-13 NOTE — Progress Notes (Deleted)
Patient ID: DANTRE YEARWOOD, male   DOB: 1961-01-16, 55 y.o.   MRN: [id number] [facility] Physicians - [place] at [facility]   PATIENT NAME: Walter Banks    MR#:  [id number]  DATE OF BIRTH:  [birth year]  DATE OF ADMISSION:  12/08/2015 ADMITTING PHYSICIAN: Enedina Finner, MD  DATE OF DISCHARGE: 12/13/15  PRIMARY CARE PHYSICIAN: Nicki Reaper, NP    ADMISSION DIAGNOSIS:  Shortness of breath [R06.02] Acute pulmonary edema (HCC) [J81.0] Atrial fibrillation with rapid ventricular response (HCC) [I48.91] Atrial fibrillation, new onset (HCC) [I48.91]  DISCHARGE DIAGNOSIS:   Acute systolic CHF Acute AFIB with RVR Severe cardiomyopathy Ongoing treatment for H pylori(7 days left SECONDARY DIAGNOSIS:   Past Medical History  Diagnosis Date  . Pneumonia   . H. pylori infection   . New onset atrial fibrillation (HCC) 12/2015  . Acute systolic (congestive) heart failure (HCC) 12/2015    a. 12/08/2015: echo showing EF of 20-25% w/ diffuse hypokinesis, severely dilated LA and RA, moderate MR and TR, PA peak pressure of 50 mmHg    HOSPITAL COURSE:  Walter Banks is a 55 y.o. male with a known history of H. pylori gastritis undergoing treatment for the same comes to the emergency room with increasing shortness of breath, PND, orthopnea, overall feeling weak. Patient was recently treated for possible pneumonia couple weeks ago thereafter got diagnosed with H. pylori currently is undergoing treatment for that. He started having above complaints came to the emergency room was found to be in A. fib with RVR. His heart rate was in the 150s  1. A. fib with RVR new onset On metoprolol 100 mg bid and digoxin -Cardiology consultation appreciated - Cardiac cath showed severe cardiac myopathy with EF of 20%. No CAD  -now on eliquis  ivLasix 20 mg tid a day monitor I's and O's and daily weights---po lasix 40 mg -Urine output 14 L since admission Echo of the heart showed severe  cardiopathy with EF of 20-25%. Since blood pressures on the lower side will hold off on Ace inhibitor and spironolactone -out pt TEE with CV --defer to cardiology  2. New onset congestive heart failure, acute systolic with EF of 20-25%. - IV Lasix Changed to po 40 mg daily continue metoprolol 100 mg 2 times a day  3. Ongoing treatment for H. pylori gastritis Patient's currently on amoxicillin and Levaquin and Protonix (7days left)  4. DVT prophylaxis po eliquis   Overall stable d/c home  CONSULTS OBTAINED:  Treatment Team:  Kathleene Hazel, MD  DRUG ALLERGIES:  No Known Allergies  DISCHARGE MEDICATIONS:   Current Discharge Medication List    START taking these medications   Details  apixaban (ELIQUIS) 5 MG TABS tablet Take 1 tablet (5 mg total) by mouth 2 (two) times daily. Qty: 60 tablet, Refills: 3    digoxin (LANOXIN) 0.25 MG tablet Take 1 tablet (0.25 mg total) by mouth daily. Qty: 30 tablet, Refills: 3    furosemide (LASIX) 40 MG tablet Take 1 tablet (40 mg total) by mouth daily. Qty: 30 tablet, Refills: 3    levofloxacin (LEVAQUIN) 250 MG tablet Take 1 tablet (250 mg total) by mouth 2 (two) times daily. Qty: 14 tablet, Refills: 0    metoprolol (LOPRESSOR) 100 MG tablet Take 1 tablet (100 mg total) by mouth 2 (two) times daily. Qty: 60 tablet, Refills: 2      CONTINUE these medications which have NOT CHANGED   Details  amoxicillin (AMOXIL) 500 MG capsule Take  2 capsules (1,000 mg total) by mouth 2 (two) times daily. Qty: 56 capsule, Refills: 0    multivitamin (ONE-A-DAY MEN'S) TABS tablet Take 1 tablet by mouth daily.    pantoprazole (PROTONIX) 40 MG tablet Take 1 tablet (40 mg total) by mouth 2 (two) times daily. Qty: 28 tablet, Refills: 0      STOP taking these medications     clarithromycin (BIAXIN) 500 MG tablet         If you experience worsening of your admission symptoms, develop shortness of breath, life threatening emergency,  suicidal or homicidal thoughts you must seek medical attention immediately by calling 911 or calling your MD immediately  if symptoms less severe.  You Must read complete instructions/literature along with all the possible adverse reactions/side effects for all the Medicines you take and that have been prescribed to you. Take any new Medicines after you have completely understood and accept all the possible adverse reactions/side effects.   Please note  You were cared for by a hospitalist during your hospital stay. If you have any questions about your discharge medications or the care you received while you were in the hospital after you are discharged, you can call the unit and asked to speak with the hospitalist on call if the hospitalist that took care of you is not available. Once you are discharged, your primary care physician will handle any further medical issues. Please note that NO REFILLS for any discharge medications will be authorized once you are discharged, as it is imperative that you return to your primary care physician (or establish a relationship with a primary care physician if you do not have one) for your aftercare needs so that they can reassess your need for medications and monitor your lab values. Today   SUBJECTIVE   Doing well. Had run of V tach last nite. None since then.  VITAL SIGNS:  Blood pressure 103/72, pulse 85, temperature 97.6 F (36.4 C), temperature source Oral, resp. rate 20, height 6\' 1"  (1.854 m), weight 100.29 kg (221 lb 1.6 oz), SpO2 98 %.  I/O:    Intake/Output Summary (Last 24 hours) at 12/13/15 0824 Last data filed at 12/13/15 0445  Gross per 24 hour  Intake    240 ml  Output   1400 ml  Net  -1160 ml    PHYSICAL EXAMINATION:  GENERAL:  55 y.o.-year-old patient lying in the bed with no acute distress.  EYES: Pupils equal, round, reactive to light and accommodation. No scleral icterus. Extraocular muscles intact.  HEENT: Head atraumatic,  normocephalic. Oropharynx and nasopharynx clear.  NECK:  Supple, no jugular venous distention. No thyroid enlargement, no tenderness.  LUNGS: Normal breath sounds bilaterally, no wheezing, rales,rhonchi or crepitation. No use of accessory muscles of respiration.  CARDIOVASCULAR: S1, S2 normal. No murmurs, rubs, or gallops. Irregularly irregular ABDOMEN: Soft, non-tender, non-distended. Bowel sounds present. No organomegaly or mass.  EXTREMITIES: No pedal edema, cyanosis, or clubbing.  NEUROLOGIC: Cranial nerves II through XII are intact. Muscle strength 5/5 in all extremities. Sensation intact. Gait not checked.  PSYCHIATRIC: patient is alert and oriented x 3.  SKIN: No obvious rash, lesion, or ulcer.   DATA REVIEW:   CBC   Recent Labs Lab 12/10/15 0157  WBC 9.4  HGB 12.6*  HCT 37.4*  PLT 141*    Chemistries   Recent Labs Lab 12/08/15 0337  12/12/15 1120 12/13/15 0403  NA 140  < > 138 138  K 4.2  < > 4.0  4.0  CL 110  < > 98* 100*  CO2 20*  < > 31 29  GLUCOSE 99  < > 87 110*  BUN 19  < > 21* 23*  CREATININE 0.92  < > 1.06 1.05  CALCIUM 8.2*  < > 9.1 8.8*  MG  --   < > 2.0  --   AST 47*  --   --   --   ALT 43  --   --   --   ALKPHOS 57  --   --   --   BILITOT 1.9*  --   --   --   < > = values in this interval not displayed.  Microbiology Results   Recent Results (from the past 240 hour(s))  MRSA PCR Screening     Status: None   Collection Time: 12/09/15  3:42 PM  Result Value Ref Range Status   MRSA by PCR NEGATIVE NEGATIVE Final    Comment:        The GeneXpert MRSA Assay (FDA approved for NASAL specimens only), is one component of a comprehensive MRSA colonization surveillance program. It is not intended to diagnose MRSA infection nor to guide or monitor treatment for MRSA infections.     RADIOLOGY:  No results found.   Management plans discussed with the patient, family and they are in agreement.  CODE STATUS:     Code Status Orders         Start     Ordered   12/08/15 1004  Full code   Continuous     12/08/15 1003    Code Status History    Date Active Date Inactive Code Status Order ID Comments User Context   This patient has a current code status but no historical code status.      TOTAL TIME TAKING CARE OF THIS PATIENT: 40 minutes.    Levora Werden M.D on 12/12/2015  Between 7am to 6pm - Pager - [phone] After 6pm go to [email or web address] - password [email or web address] [facility]  [place] [facility]  Office  [phone]  CC: Primary care physician; Nicki Reaper, NP

## 2015-12-13 NOTE — Progress Notes (Signed)
Orders to d/c pt to home. Discharge instructions given to pt with prescriptions e-submitted. An excess of pt education given and all pt questions answered. IV and tele removed and pt escorted off unit via volunteer services.

## 2015-12-13 NOTE — Progress Notes (Signed)
Patient Name: Walter Banks Date of Encounter: 12/13/2015  Hospital Problem List     Principal Problem:   New onset atrial fibrillation [facility]) Active Problems:   Acute systolic (congestive) heart failure (HCC)   Acute pulmonary edema (HCC)   Congestive dilated cardiomyopathy (HCC)   NICM (nonischemic cardiomyopathy) (HCC)   VT (ventricular tachycardia) (HCC)   Obesity (BMI 30-39.9)    Subjective   Feels well.  No c/p, sob, or palps.  No further VT.  Remains in AF and rates do rise with activity - 130's to 140's @ times.  Tolerating higher dose of  blocker.  Inpatient Medications    . amoxicillin  1,000 mg Oral Q12H  . apixaban  5 mg Oral BID  . digoxin  0.25 mg Oral Daily  . furosemide  40 mg Oral BID  . levofloxacin  250 mg Oral BID  . metoprolol tartrate  75 mg Oral BID  . multivitamin with minerals  1 tablet Oral Daily  . pantoprazole  40 mg Oral BID  . sodium chloride flush  3 mL Intravenous Q12H    Vital Signs    Filed Vitals:   12/12/15 1053 12/12/15 1812 12/12/15 2023 12/13/15 0445  BP: 113/77 106/68 119/89 103/72  Pulse: 80 78 99 85  Temp:   97.4 F (36.3 C) 97.6 F (36.4 C)  TempSrc:   Oral Oral  Resp:   20 20  Height:      Weight:    221 lb 1.6 oz (100.29 kg)  SpO2:   100% 98%    Intake/Output Summary (Last 24 hours) at 12/13/15 0759 Last data filed at 12/13/15 0445  Gross per 24 hour  Intake    240 ml  Output   2200 ml  Net  -1960 ml   Filed Weights   12/09/15 1529 12/12/15 0607 12/13/15 0445  Weight: 242 lb 8.1 oz (110 kg) 223 lb 11.2 oz (101.47 kg) 221 lb 1.6 oz (100.29 kg)    Physical Exam    General: Pleasant, NAD. Neuro: Alert and oriented X 3. Moves all extremities spontaneously. Psych: Normal affect. HEENT:  Normal  Neck: Supple without bruits or JVD. Lungs:  Resp regular and unlabored, CTA. Heart: IR, IR no s3, s4, or murmurs. Abdomen: Soft, non-tender, non-distended, BS + x 4.  Extremities: No clubbing, cyanosis or  edema. DP/PT/Radials 2+ and equal bilaterally.  Labs    Basic Metabolic Panel  Recent Labs  12/12/15 1120 12/13/15 0403  NA 138 138  K 4.0 4.0  CL 98* 100*  CO2 31 29  GLUCOSE 87 110*  BUN 21* 23*  CREATININE 1.06 1.05  CALCIUM 9.1 8.8*  MG 2.0  --     Telemetry    Afib - mostly 80's though increases into 100's with activity.  Radiology    Dg Chest 2 View  11/16/2015  CLINICAL DATA:  Recent diagnosis of pneumonia with improved symptoms EXAM: CHEST  2 VIEW COMPARISON:  None. FINDINGS: Cardiac shadow is mildly enlarged. The lungs are well aerated bilaterally. No sizable effusion is noted. Curvilinear increased density is noted in the left mid lung and perihilar region projecting in the left upper lobe on the lateral projection IMPRESSION: Persistent infiltrative density in the left upper lobe. Continued follow-up is recommended. Electronically Signed   By: Alcide Clever M.D.   On: 11/16/2015 17:35   Dg Abd 1 View  12/04/2015  CLINICAL DATA:  Abdominal distention and bloating for 1 week. EXAM: ABDOMEN -  1 VIEW COMPARISON:  None. FINDINGS: Bowel gas pattern is normal. No significant osseous abnormality. Phleboliths in the right side of the pelvis. IMPRESSION: Benign appearing abdomen. No distended bowel. No excessive air in the bowel. Electronically Signed   By: Francene Boyers M.D.   On: 12/04/2015 13:54   Dg Chest Port 1 View  12/08/2015  CLINICAL DATA:  Acute onset of generalized weakness, shortness of breath and hot flashes. Insomnia. Initial encounter. EXAM: PORTABLE CHEST 1 VIEW COMPARISON:  Chest radiograph performed 11/16/2015 FINDINGS: The lungs are well-aerated. Vascular congestion is noted. Bibasilar airspace opacities raise concern for pulmonary edema. Small bilateral pleural effusions are seen. There is no evidence of pneumothorax. The cardiomediastinal silhouette is mildly enlarged. No acute osseous abnormalities are seen. IMPRESSION: Vascular congestion and mild cardiomegaly.  Bibasilar airspace opacities raise concern for pulmonary edema. Small bilateral pleural effusions seen. Electronically Signed   By: Roanna Raider M.D.   On: 12/08/2015 04:53    Assessment & Plan     1. Afib RVR: New onset this admission though pt says he has had palpitations for several wks with documentation of tachycardia @ primary clinic visits w/o ECGs (HR in ED 2/11 was 62; 115 on 2/21, 126 on 2/27, 114 on 3/1). Rate currently reasonable - 80's overnight though does rise with activity. He remains asympt. -Cont rate control w/  blocker therapy and plan for DCCV following 4 wks of anticoagulation with eliquis, which was resumed 3/7.  -I will increase  blocker to 100 bid today as BP has been stable. -If he has recurrent CHF despite rate control or if rate becomes difficult to control, plan TEE and DCCV sooner as outpt. -Of note - sev dil LA in setting of mod MR and LV dysfxn on echo. Likelihood of maintaining sinus likely low in the absence of antiarrhythmic therapy.  2. Ventricular Tachycardia/NSVT: He had previously had NSVT but on evening of 3/8, he had 38 beats of VT, which was symptomatic (flushing). -/Mg wnl. -VT quiescent after titration of  blocker.  3. Acute Systolic CHF/NICM (likely tachycardia mediated): Pt presented with CHF in the setting of AF RVR.  Echo this admission revealed EF of 20-25% with sev LV dil and diff HK. Cath 3/7 revealed mod LAD dzs with otw nl cors and elevated right heart pressures. Minus 1.9L overnight and 15.9L since admission. Wt down to 221. He says dry weight prev in the 230's. BUN rising.  Bicarb stable. -Drop lasix to 40 mg daily. -Cont  blocker at current dose.  -No ACEI/ARB/ARNI/Spiro given soft BP's. --He has f/u in CHF clinic on 3/15 and f/u with [place] on 3/30.  4. Moderate Mitral Regurgitation: In setting of AFib RVR and LV dysfxn. -Follow as outpt.  5. H. Pylori gastritis -Abx per IM.  Signed, Nicolasa Ducking NP

## 2015-12-16 ENCOUNTER — Telehealth: Payer: Self-pay | Admitting: Cardiovascular Disease

## 2015-12-16 NOTE — Telephone Encounter (Signed)
Patient dropped off STD forms to be completed for work.  Patient completed ROI form and will bring Check in for $25.00 upon next appt. In office.  Forms faxed to [facility] at [employer]. 12/16/15 JH.

## 2015-12-18 ENCOUNTER — Ambulatory Visit: Payer: BLUE CROSS/BLUE SHIELD | Attending: Family | Admitting: Family

## 2015-12-18 ENCOUNTER — Encounter: Payer: Self-pay | Admitting: Family

## 2015-12-18 VITALS — BP 97/70 | HR 63 | Resp 20 | Ht 73.0 in | Wt 232.0 lb

## 2015-12-18 DIAGNOSIS — I5022 Chronic systolic (congestive) heart failure: Secondary | ICD-10-CM | POA: Diagnosis not present

## 2015-12-18 DIAGNOSIS — Z7901 Long term (current) use of anticoagulants: Secondary | ICD-10-CM | POA: Insufficient documentation

## 2015-12-18 DIAGNOSIS — I4891 Unspecified atrial fibrillation: Secondary | ICD-10-CM | POA: Diagnosis not present

## 2015-12-18 DIAGNOSIS — Z9889 Other specified postprocedural states: Secondary | ICD-10-CM | POA: Insufficient documentation

## 2015-12-18 DIAGNOSIS — I10 Essential (primary) hypertension: Secondary | ICD-10-CM

## 2015-12-18 DIAGNOSIS — Z79899 Other long term (current) drug therapy: Secondary | ICD-10-CM | POA: Insufficient documentation

## 2015-12-18 NOTE — Progress Notes (Signed)
Subjective:    Patient ID: Walter Banks, male    DOB: 07-Dec-1960, 55 y.o.   MRN: [id number]  Congestive Heart Failure Presents for initial visit. The disease course has been improving. Associated symptoms include fatigue and shortness of breath. Pertinent negatives include no abdominal pain, chest pain, chest pressure, edema, orthopnea or palpitations. The symptoms have been improving. Past treatments include beta blockers, salt and fluid restriction and digoxin. The treatment provided moderate relief. Compliance with prior treatments has been good. His past medical history is significant for arrhythmia and HTN. There is no history of chronic lung disease, CVA or DM. He has multiple 1st degree relatives with heart disease.  Hypertension This is a new problem. The current episode started 1 to 4 weeks ago. The problem has been gradually improving since onset. Associated symptoms include malaise/fatigue and shortness of breath. Pertinent negatives include no chest pain, headaches, neck pain, palpitations or peripheral edema. There are no associated agents to hypertension. Risk factors for coronary artery disease include family history and male gender. Past treatments include beta blockers, diuretics and lifestyle changes. The current treatment provides significant improvement. There are no compliance problems.  Hypertensive end-organ damage includes heart failure.  Atrial Fibrillation Presents for initial visit. Symptoms include hypertension and shortness of breath. Symptoms are negative for chest pain, dizziness, palpitations, tachycardia and weakness. The symptoms have been improving. Past treatments include anticoagulant and beta blockers. Compliance with prior treatments has been good. Past medical history includes atrial fibrillation, CHF and HTN. There are no medication compliance problems.    Past Medical History  Diagnosis Date  . Pneumonia   . H. pylori infection   . New onset atrial  fibrillation (HCC) 12/2015  . Acute systolic (congestive) heart failure (HCC) 12/2015    a. 12/08/2015: echo showing EF of 20-25% w/ diffuse hypokinesis, severely dilated LA and RA, moderate MR and TR, PA peak pressure of 50 mmHg  . Arrhythmia     Past Surgical History  Procedure Laterality Date  . Wrist surgery Left 2013  . Cardiac catheterization N/A 12/10/2015    Procedure: Right and Left Heart Cath;  Surgeon: Antonieta Iba, MD;  Location: [facility] INVASIVE CV LAB;  Service: Cardiovascular;  Laterality: N/A;    Family History  Problem Relation Age of Onset  . Hypertension Father   . Heart attack Father   . Diabetes Father   . Cancer Neg Hx   . Stroke Neg Hx   . Heart disease Mother   . Diabetes Mother   . Heart failure Sister   . Diabetes Brother     Social History  Substance Use Topics  . Smoking status: Never Smoker   . Smokeless tobacco: Never Used  . Alcohol Use: 4.8 oz/week    8 Cans of beer per week     Comment: occasional    No Known Allergies  Prior to Admission medications   Medication Sig Start Date End Date Taking? Authorizing Provider  amoxicillin (AMOXIL) 500 MG capsule Take 2 capsules (1,000 mg total) by mouth 2 (two) times daily. 12/05/15  Yes Lorre Munroe, NP  apixaban (ELIQUIS) 5 MG TABS tablet Take 1 tablet (5 mg total) by mouth 2 (two) times daily. 12/12/15  Yes Enedina Finner, MD  digoxin (LANOXIN) 0.25 MG tablet Take 1 tablet (0.25 mg total) by mouth daily. 12/12/15  Yes Enedina Finner, MD  furosemide (LASIX) 40 MG tablet Take 1 tablet (40 mg total) by mouth daily. 12/12/15  Yes Sona  Allena Katz, MD  levofloxacin (LEVAQUIN) 250 MG tablet Take 1 tablet (250 mg total) by mouth 2 (two) times daily. 12/12/15  Yes Enedina Finner, MD  metoprolol (LOPRESSOR) 100 MG tablet Take 1 tablet (100 mg total) by mouth 2 (two) times daily. 12/13/15  Yes Enedina Finner, MD  multivitamin (ONE-A-DAY MEN'S) TABS tablet Take 1 tablet by mouth daily.   Yes Historical Provider, MD  pantoprazole (PROTONIX) 40  MG tablet Take 1 tablet (40 mg total) by mouth 2 (two) times daily. 12/05/15  Yes Lorre Munroe, NP     Review of Systems  Constitutional: Positive for malaise/fatigue and fatigue. Negative for appetite change.  HENT: Negative for congestion, postnasal drip and sore throat.   Eyes: Negative.   Respiratory: Positive for shortness of breath. Negative for cough and chest tightness.   Cardiovascular: Negative for chest pain, palpitations and leg swelling.  Gastrointestinal: Negative for abdominal pain and abdominal distention.  Endocrine: Negative.   Genitourinary: Negative.   Musculoskeletal: Negative for back pain and neck pain.  Skin: Negative.   Allergic/Immunologic: Negative.   Neurological: Positive for light-headedness. Negative for dizziness, weakness and headaches.  Hematological: Negative for adenopathy. Does not bruise/bleed easily.  Psychiatric/Behavioral: Negative for sleep disturbance (sleeping on 2 pillows) and dysphoric mood. The patient is not nervous/anxious.        Objective:   Physical Exam  Constitutional: He is oriented to person, place, and time. He appears well-developed and well-nourished.  HENT:  Head: Normocephalic and atraumatic.  Eyes: Conjunctivae are normal. Pupils are equal, round, and reactive to light.  Neck: Normal range of motion. Neck supple.  Cardiovascular: An irregular rhythm present. Bradycardia present.   No murmur heard. Pulmonary/Chest: Effort normal. He has no wheezes. He has no rales.  Abdominal: Soft. He exhibits no distension. There is no tenderness.  Musculoskeletal: He exhibits no edema or tenderness.  Neurological: He is alert and oriented to person, place, and time.  Skin: Skin is warm and dry.  Psychiatric: He has a normal mood and affect. His behavior is normal. Thought content normal.  Nursing note and vitals reviewed.   BP 97/70 mmHg  Pulse 63  Resp 20  Ht  (1.854 m)  Wt 232 lb (105.235 kg)  BMI 30.62 kg/m2  SpO2  100%       Assessment & Plan:  1: Chronic heart failure with reduced ejection fraction- Patient presents with fatigue and shortness of breath (Class II) upon exertion although he feels like both symptoms are improving. He has already been weighing himself and says that his weight has been stable. Instructed to call for an overnight weight gain of >2 pounds or a weekly weight gain of >5 pounds. He is not adding salt to his food and is now reading food labels. Reviewed the importance of following a  sodium diet and written information was given to him about this. Discussed adding entresto at his next office visit and may decrease his furosemide as well if able. Will also discuss cardiac rehab with the patient. [facility] Pharm[facility] went in and reviewed medications with the patient. 2: HTN- Blood pressure is on the low side. Could adjust diuretic per above to allow for more pressure so that Entresto could be started. 3: Atrial fibrillation- Currently rate controlled on metoprolol and digoxin. May not need digoxin long-term since he's on  metoprolol twice daily. Remains also on eliquis. Has a cardiology appointment on 01/02/16.  Return here in 1 month or sooner for any questions/problems before  then.

## 2015-12-18 NOTE — Patient Instructions (Addendum)
Continue weighing daily and call for an overnight weight gain of > 2 pounds or a weekly weight gain of >5 pounds. 

## 2015-12-19 ENCOUNTER — Encounter: Payer: Self-pay | Admitting: Internal Medicine

## 2015-12-19 ENCOUNTER — Ambulatory Visit (INDEPENDENT_AMBULATORY_CARE_PROVIDER_SITE_OTHER): Payer: BLUE CROSS/BLUE SHIELD | Admitting: Internal Medicine

## 2015-12-19 VITALS — BP 102/70 | HR 51 | Temp 96.8°F

## 2015-12-19 DIAGNOSIS — A048 Other specified bacterial intestinal infections: Secondary | ICD-10-CM

## 2015-12-19 DIAGNOSIS — I4891 Unspecified atrial fibrillation: Secondary | ICD-10-CM

## 2015-12-19 DIAGNOSIS — I10 Essential (primary) hypertension: Secondary | ICD-10-CM

## 2015-12-19 DIAGNOSIS — I5021 Acute systolic (congestive) heart failure: Secondary | ICD-10-CM | POA: Diagnosis not present

## 2015-12-19 DIAGNOSIS — B9681 Helicobacter pylori [H. pylori] as the cause of diseases classified elsewhere: Secondary | ICD-10-CM | POA: Diagnosis not present

## 2015-12-19 NOTE — Patient Instructions (Signed)

## 2015-12-19 NOTE — Progress Notes (Addendum)
Patient ID: Walter Banks, male   DOB: [birth year], 55 y.o.   MRN: [id number] [facility] [place] [facility]   PATIENT NAME: Walter Banks    MR#:  [id number]  DATE OF BIRTH:  10-Aug-1961  SUBJECTIVE:  Improving slowly  REVIEW OF SYSTEMS:   Review of Systems  Constitutional: Negative for fever, chills and weight loss.  HENT: Negative for ear discharge, ear pain and nosebleeds.   Eyes: Negative for blurred vision, pain and discharge.  Respiratory: Negative for sputum production, shortness of breath, wheezing and stridor.   Cardiovascular: Negative for chest pain, palpitations, orthopnea and PND.  Gastrointestinal: Negative for nausea, vomiting, abdominal pain and diarrhea.  Genitourinary: Negative for urgency and frequency.  Musculoskeletal: Negative for back pain and joint pain.  Neurological: Negative for sensory change, speech change, focal weakness and weakness.  Psychiatric/Behavioral: Negative for depression and hallucinations. The patient is not nervous/anxious.    Tolerating Diet:yes Tolerating PT: not needed  DRUG ALLERGIES:  No Known Allergies  VITALS:  Blood pressure 105/71, pulse 72, temperature 98 F (36.7 C), temperature source Oral, resp. rate 18, height 6\' 1"  (1.854 m), weight 100.29 kg (221 lb 1.6 oz), SpO2 98 %.  PHYSICAL EXAMINATION:   Physical Exam  GENERAL:  55 y.o.-year-old patient lying in the bed with no acute distress.  EYES: Pupils equal, round, reactive to light and accommodation. No scleral icterus. Extraocular muscles intact.  HEENT: Head atraumatic, normocephalic. Oropharynx and nasopharynx clear.  NECK:  Supple, no jugular venous distention. No thyroid enlargement, no tenderness.  LUNGS: Normal breath sounds bilaterally, no wheezing, rales, rhonchi. No use of accessory muscles of respiration.  CARDIOVASCULAR: S1, S2 normal. No murmurs, rubs, or gallops.  ABDOMEN: Soft, nontender, nondistended. Bowel sounds  present. No organomegaly or mass.  EXTREMITIES: No cyanosis, clubbing or edema b/l.    NEUROLOGIC: Cranial nerves II through XII are intact. No focal Motor or sensory deficits b/l.   PSYCHIATRIC:  patient is alert and oriented x 3.  SKIN: No obvious rash, lesion, or ulcer.   LABORATORY PANEL:  CBC No results for input(s): WBC, HGB, HCT, PLT in the last 168 hours.  Chemistries   Recent Labs Lab 12/13/15 0403  NA 138  K 4.0  CL 100*  CO2 29  GLUCOSE 110*  BUN 23*  CREATININE 1.05  CALCIUM 8.8*   Cardiac Enzymes No results for input(s): TROPONINI in the last 168 hours. RADIOLOGY:  No results found. ASSESSMENT AND PLAN:   Walter Banks is a 55 y.o. male with a known history of H. pylori gastritis undergoing treatment for the same comes to the emergency room with increasing shortness of breath, PND, orthopnea, overall feeling weak. Patient was recently treated for possible pneumonia couple weeks ago thereafter got diagnosed with H. pylori currently is undergoing treatment for that. He started having above complaints came to the emergency room was found to be in A. fib with RVR. His heart rate was in the 150s  1. A. fib with RVR new onset On metoprolol 100 mg bid and digoxin -Cardiology consultation appreciated - Cardiac cath showed severe cardiac myopathy with EF of 20%. No CAD  -now on eliquis  ivLasix 20 mg tid a day monitor I's and O's and daily weights---po lasix 40 mg -Urine output 14 L since admission Echo of the heart showed severe cardiopathy with EF of 20-25%. Since blood pressures on the lower side will hold off on Ace inhibitor and spironolactone -out pt TEE with  CV --defer to cardiology  2. New onset congestive heart failure, acute systolic with EF of 20-25%. - IV Lasix Changed to po 40 mg daily continue metoprolol 100 mg 2 times a day  3. Ongoing treatment for H. pylori gastritis Patient's currently on amoxicillin and Levaquin and Protonix (7days  left)  4. DVT prophylaxis po eliquis   Case discussed with Care Management/Social Worker. Management plans discussed with the patient, family and they are in agreement.  CODE STATUfull  DVT Prophylaxis: eliquis  TOTAL TIME TAKING CARE OF THIS PATIENT: *30 minutes.  >50% time spent on counselling and coordination of care  POSSIBLE D/C IN *1 DAYS, DEPENDING ON CLINICAL CONDITION.  Note: This dictation was prepared with Dragon dictation along with smaller phrase technology. Any transcriptional errors that result from this process are unintentional.  Edison Wollschlager M.D  Between 7am to 6pm - Pager - [phone]  After 6pm go to [email or web address] - password [email or web address] [facility]  [place] [facility]  Office  [phone]  CC: Primary care physician; Nicki Reaper, NP

## 2015-12-19 NOTE — Progress Notes (Signed)
Pre visit review using our clinic review tool, if applicable. No additional management support is needed unless otherwise documented below in the visit note. 

## 2015-12-19 NOTE — Progress Notes (Signed)
Subjective:    Patient ID: Walter Banks, male    DOB: 22-May-1961, 55 y.o.   MRN: [id number]  HPI  Pt presents to the clinic today for hospital follow up. He went to the ER 3/6 with weakness and shortness of breath. He was found to be in Afib with RVR with acute congestive heart failure. Cardiology was consulted. He was started on Metoprolol and Eliquis. He had a cardiac cath with showed an EF of 20%, but no CAD. He was started on IV Lasix and eventually transitioned to oral Lasix. Since discharge, his fatigue has improved. He has been slightly lightheaded. He denies chest pain or shortness of breath.  Prior to admission, he was being treated for H Pylori. He has continued his antibiotics for that and reports improvement in his abdominal pain.  He has been scheduled to have an outpatient TEE with cardiology, has an appt 01/02/16. He has already established care with the Heart Failure Clinic, note reviewed.  Review of Systems  Past Medical History  Diagnosis Date  . Pneumonia   . H. pylori infection   . New onset atrial fibrillation (HCC) 12/2015  . Acute systolic (congestive) heart failure (HCC) 12/2015    a. 12/08/2015: echo showing EF of 20-25% w/ diffuse hypokinesis, severely dilated LA and RA, moderate MR and TR, PA peak pressure of 50 mmHg  . Arrhythmia     Current Outpatient Prescriptions  Medication Sig Dispense Refill  . amoxicillin (AMOXIL) 500 MG capsule Take 2 capsules (1,000 mg total) by mouth 2 (two) times daily. 56 capsule 0  . apixaban (ELIQUIS) 5 MG TABS tablet Take 1 tablet (5 mg total) by mouth 2 (two) times daily. 60 tablet 3  . digoxin (LANOXIN) 0.25 MG tablet Take 1 tablet (0.25 mg total) by mouth daily. 30 tablet 3  . furosemide (LASIX) 40 MG tablet Take 1 tablet (40 mg total) by mouth daily. 30 tablet 3  . levofloxacin (LEVAQUIN) 250 MG tablet Take 1 tablet (250 mg total) by mouth 2 (two) times daily. 14 tablet 0  . metoprolol (LOPRESSOR) 100 MG tablet Take 1  tablet (100 mg total) by mouth 2 (two) times daily. 60 tablet 2  . multivitamin (ONE-A-DAY MEN'S) TABS tablet Take 1 tablet by mouth daily.    . pantoprazole (PROTONIX) 40 MG tablet Take 1 tablet (40 mg total) by mouth 2 (two) times daily. 28 tablet 0   No current facility-administered medications for this visit.    No Known Allergies  Family History  Problem Relation Age of Onset  . Hypertension Father   . Heart attack Father   . Diabetes Father   . Cancer Neg Hx   . Stroke Neg Hx   . Heart disease Mother   . Diabetes Mother   . Heart failure Sister   . Diabetes Brother     Social History   Social History  . Marital Status: Single    Spouse Name: N/A  . Number of Children: N/A  . Years of Education: N/A   Occupational History  . 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 Smoking status: Never Smoker   . Smokeless tobacco: Never Used  . Alcohol Use: 4.8 oz/week    8 Cans of beer per week     Comment: occasional  . Drug Use: No  . Sexual Activity: Yes   Other Topics Concern  . Not on file   Social History Narrative  Constitutional: Pt reports fatigue. Denies fever, malaise, headache or abrupt weight changes.  Respiratory: Denies difficulty breathing, shortness of breath, cough or sputum production.   Cardiovascular: Denies chest pain, chest tightness, palpitations or swelling in the hands or feet.  Gastrointestinal: Denies abdominal pain, bloating, constipation, diarrhea or blood in the stool.  Neurological: Denies dizziness, difficulty with memory, difficulty with speech or problems with balance and coordination.  Psych: Denies anxiety, depression, SI/HI.  No other specific complaints in a complete review of systems (except as listed in HPI above).     Objective:   Physical Exam  BP 102/70 mmHg  Pulse 51  Temp(Src) 96.8 F (36 C) (Tympanic)  SpO2 99% Wt Readings from Last 3 Encounters:  12/18/15 232 lb (105.235 kg)  12/13/15  221 lb 1.6 oz (100.29 kg)  12/04/15 249 lb (112.946 kg)    General: Appears his stated age, in NAD. Cardiovascular: Bradycardic with normal rhythm. S1,S2 noted.  No murmur, rubs or gallops noted. No JVD or BLE edema.  Pulmonary/Chest: Normal effort and positive vesicular breath sounds. No respiratory distress. No wheezes, rales or ronchi noted.  Abdomen: Soft and nontender. Normal bowel sounds. No distention or masses noted..  Neurological: Alert and oriented.  Psychiatric: Mood and affect normal. Behavior is normal. Judgment and thought content normal.     BMET    Component Value Date/Time   NA 138 12/13/2015 0403   K 4.0 12/13/2015 0403   CL 100* 12/13/2015 0403   CO2 29 12/13/2015 0403   GLUCOSE 110* 12/13/2015 0403   BUN 23* 12/13/2015 0403   CREATININE 1.05 12/13/2015 0403   CALCIUM 8.8* 12/13/2015 0403   GFRNONAA >60 12/13/2015 0403   GFRAA >60 12/13/2015 0403    Lipid Panel     Component Value Date/Time   CHOL 181 12/10/2015 0157   TRIG 81 12/10/2015 0157   HDL 32* 12/10/2015 0157   CHOLHDL 5.7 12/10/2015 0157   VLDL 16 12/10/2015 0157   LDLCALC 133* 12/10/2015 0157    CBC    Component Value Date/Time   WBC 9.4 12/10/2015 0157   RBC 3.89* 12/10/2015 0157   HGB 12.6* 12/10/2015 0157   HCT 37.4* 12/10/2015 0157   PLT 141* 12/10/2015 0157   MCV 96.0 12/10/2015 0157   MCH 32.4 12/10/2015 0157   MCHC 33.7 12/10/2015 0157   RDW 14.3 12/10/2015 0157   LYMPHSABS 1.9 12/10/2015 0157   MONOABS 1.0 12/10/2015 0157   EOSABS 0.1 12/10/2015 0157   BASOSABS 0.1 12/10/2015 0157    Hgb A1C Lab Results  Component Value Date   HGBA1C 5.6 12/10/2015         Assessment & Plan:   Hospital follow up for Afib and Acute Congestive Heart Failure:  Hospital notes, labs and imaging reviewed He will continue current medications He will continue to follow with Heart Failure clinic He will continue to follow with cardiology- he is out of work and cannot return until  EF > 40% Return precautions given  RTC in 6 months for annual exam

## 2015-12-20 ENCOUNTER — Telehealth: Payer: Self-pay | Admitting: Cardiovascular Disease

## 2015-12-20 NOTE — Telephone Encounter (Signed)
Pt calling stating he was could not go work for a few months would like a letter so he can give it to his employer.  Please fax to: [phone]  Attn: Stark Bray

## 2015-12-22 NOTE — Telephone Encounter (Signed)
That is fine 

## 2015-12-24 NOTE — Telephone Encounter (Signed)
Faxed letter to [facility], [phone]

## 2015-12-31 ENCOUNTER — Encounter: Payer: Self-pay | Admitting: Physician Assistant

## 2016-01-02 ENCOUNTER — Encounter: Payer: Self-pay | Admitting: Physician Assistant

## 2016-01-02 ENCOUNTER — Ambulatory Visit (INDEPENDENT_AMBULATORY_CARE_PROVIDER_SITE_OTHER): Payer: BLUE CROSS/BLUE SHIELD | Admitting: Physician Assistant

## 2016-01-02 ENCOUNTER — Encounter (INDEPENDENT_AMBULATORY_CARE_PROVIDER_SITE_OTHER): Payer: Self-pay

## 2016-01-02 VITALS — BP 108/62 | HR 74 | Ht 73.0 in | Wt 240.5 lb

## 2016-01-02 DIAGNOSIS — I4891 Unspecified atrial fibrillation: Secondary | ICD-10-CM | POA: Diagnosis not present

## 2016-01-02 DIAGNOSIS — I10 Essential (primary) hypertension: Secondary | ICD-10-CM

## 2016-01-02 DIAGNOSIS — I420 Dilated cardiomyopathy: Secondary | ICD-10-CM | POA: Diagnosis not present

## 2016-01-02 DIAGNOSIS — I5023 Acute on chronic systolic (congestive) heart failure: Secondary | ICD-10-CM

## 2016-01-02 DIAGNOSIS — Z01812 Encounter for preprocedural laboratory examination: Secondary | ICD-10-CM

## 2016-01-02 MED ORDER — POTASSIUM CHLORIDE ER 20 MEQ PO TBCR: 20.0000 meq | EXTENDED_RELEASE_TABLET | Freq: Every day | ORAL | Status: DC

## 2016-01-02 NOTE — Progress Notes (Signed)
Cardiology Office Note Date:  01/02/2016  Patient ID:  Lavel, Rieman Apr 24, 1961, MRN [id number] PCP:  Nicki Reaper, NP  Cardiologist:  Dr. Kirke Corin, MD    Chief Complaint: Hospital follow up  History of Present Illness: Walter Banks is a 55 y.o. male with history of persistent Afib on Eliquis, chronic systolic CHF/NICM, history of VT/NSVT, and moderate mitral regurgitation who presents for hospital follow up of recent admission to [facility] from 3/5 to 3/10 for acute systolic CHF and Afib with RVR.   Patient had recently been treated for PNA and H pylori. He was admitted for 6 week history of weakness, dyspnea, and orthopnea. He was found to be in Afib with RVR with HR in the 140's and acute systolic CHF with a 10 pound weight gain and lower extremity edema. He did not feel his palpitations. He was noted to be tachycardic at prior PCP visits, no ECG for review at those times. CXR showed pulmonary edema. He diuresed 15.9 liters for the admission with a discharge weight of 221 pounds. Echo showed EF 20-25%, diffuse HK, moderate MR, left atrium severely dilated at 51 mm, RV mildly dilated with mildly reduced systolic function, RA severely dilated, PASP 50 mm Hg. Right and left cardiac cath on 3/7 showed moderate proximal LAD disease with 40% stenosis otherwise no significant stenosis. Right heart cath showed severely elevated right heart pressures including wedge pressure. His beta blocker was titrated up. His soft BP precluded ACEi/ARB/ARNI/spiro. He was started on Eliquis and the above BB for his Afib with plans for DCCV s/p 3 weeks of anticoagulation. He did have a 38 beat run of VT with associated flushing. No further episodes with titration of BB.   In follow up with CHF clinic on 12/18/15 his weight was 232 pounds. He was noted to be SOB and fatigued though he felt like these were improving. His soft BP of 97 systolic precluded addition of further HF medications including Entresto. No  changes were made.  Today, he notes continued weight gain and SOB. Current weights at home have gone from 223 on 3/11 to 240 on 3/29. Weight here is 240 pounds. He has been following a HF diet consisting of no salt and fluid restrictions. Currently taking his medications as directed and has not missed any doses, including no missed doses of his Eliquis. He has not noted any tachy-palpitations. No orthopnea, PND, or early satiety. No symptoms similar to what brought him to the hospital the prior time.    Past Medical History  Diagnosis Date  . Pneumonia   . H. pylori infection   . Persistent atrial fibrillation (HCC) 12/2015    a. on Eliquis; CHADS2VASc = 1 (CHF)  . Chronic systolic CHF (congestive heart failure) (HCC) 12/2015    a. 12/08/2015: echo showing EF of 20-25% w/ diffuse hypokinesis, severely dilated LA and RA, moderate MR and TR, PA peak pressure of 50 mmHg  . NICM (nonischemic cardiomyopathy) (HCC)     a. cardiac cath 12/10/15: pLAD 40%, o/w no stenosis, severely elevated right heart pressures   . VT (ventricular tachycardia) (HCC)     a. 38 beat run during admission 12/2015; b. associated with flushing   . Mitral regurgitation     a. echo as above    Past Surgical History  Procedure Laterality Date  . Wrist surgery Left 2013  . Cardiac catheterization N/A 12/10/2015    Procedure: Right and Left Heart Cath;  Surgeon: Antonieta Iba, MD;  Location: [facility] INVASIVE CV LAB;  Service: Cardiovascular;  Laterality: N/A;    Current Outpatient Prescriptions  Medication Sig Dispense Refill  . apixaban (ELIQUIS) 5 MG TABS tablet Take 1 tablet (5 mg total) by mouth 2 (two) times daily. 60 tablet 3  . digoxin (LANOXIN) 0.25 MG tablet Take 1 tablet (0.25 mg total) by mouth daily. 30 tablet 3  . furosemide (LASIX) 40 MG tablet Take 1 tablet (40 mg total) by mouth daily. 30 tablet 3  . metoprolol (LOPRESSOR) 100 MG tablet Take 1 tablet (100 mg total) by mouth 2 (two) times daily. 60 tablet 2  .  multivitamin (ONE-A-DAY MEN'S) TABS tablet Take 1 tablet by mouth daily.     No current facility-administered medications for this visit.    Allergies:   Review of patient's allergies indicates no known allergies.   Social History:  The patient  reports that he has never smoked. He has never used smokeless tobacco. He reports that he drinks about 4.8 oz of alcohol per week. He reports that he does not use illicit drugs.   Family History:  The patient's family history includes Diabetes in his brother, father, and mother; Heart attack in his father; Heart disease in his mother; Heart failure in his sister; Hypertension in his father. There is no history of Cancer or Stroke.  ROS:   Review of Systems  Constitutional: Positive for malaise/fatigue. Negative for fever, chills, weight loss and diaphoresis.  HENT: Negative for congestion.   Eyes: Negative for discharge and redness.  Respiratory: Positive for cough and shortness of breath. Negative for hemoptysis, sputum production and wheezing.   Cardiovascular: Positive for leg swelling. Negative for chest pain, palpitations, orthopnea, claudication and PND.  Gastrointestinal: Negative for nausea, vomiting and abdominal pain.  Musculoskeletal: Negative for falls.  Skin: Negative for rash.  Neurological: Positive for weakness. Negative for sensory change, speech change, focal weakness and loss of consciousness.  Endo/Heme/Allergies: Does not bruise/bleed easily.  Psychiatric/Behavioral: The patient is not nervous/anxious.       PHYSICAL EXAM:  VS:  Ht  (1.854 m)  Wt 240 lb 8 oz (109.09 kg)  BMI 31.74 kg/m2 BMI: Body mass index is 31.74 kg/(m^2). Well nourished, well developed, in no acute distress HEENT: normocephalic, atraumatic Neck: no JVD, carotid bruits or masses Cardiac: irregularly irregular S1, S2; RRR; no murmurs, rubs, or gallops Lungs:  clear to auscultation bilaterally, no wheezing, rhonchi or rales Abd: soft, nontender,  no hepatomegaly, + BS MS: no deformity or atrophy Ext: 1+ bilateral lower extremity edema to the mid shins Skin: warm and dry, no rash Neuro:  moves all extremities spontaneously, no focal abnormalities noted, follows commands Psych: euthymic mood, full affect   EKG:  Was ordered today. Shows Afib, 74 bpm, poor R wave progression, nonspecific inferolateral st/t changes  Recent Labs: 12/08/2015: ALT 43; B Natriuretic Peptide 673.0*; TSH 1.506 12/10/2015: Hemoglobin 12.6*; Platelets 141* 12/12/2015: Magnesium 2.0 12/13/2015: BUN 23*; Creatinine, Ser 1.05; Potassium 4.0; Sodium 138  12/10/2015: Cholesterol 181; HDL 32*; LDL Cholesterol 133*; Total CHOL/HDL Ratio 5.7; Triglycerides 81; VLDL 16   Estimated Creatinine Clearance: 103 mL/min (by C-G formula based on Cr of 1.05).   Wt Readings from Last 3 Encounters:  01/02/16 240 lb 8 oz (109.09 kg)  12/18/15 232 lb (105.235 kg)  12/13/15 221 lb 1.6 oz (100.29 kg)     Other studies reviewed: Additional studies/records reviewed today include: summarized above  ASSESSMENT AND PLAN:  1. Acute on chronic systolic CHF/NICM:  Mildly volume overloaded. Has been drinking large amounts of water 2/2 thirst. Increase Lasix to 40 mg bid through 4/3. Check bmet today and again the first of next week s/p diuresis. Add KCl repletion. Decrease PO fluid consumption. He has been watching his salt intake. BP almost stable enough for Entresto. I will hold on starting this vs ACEi at this time given the need for the above diuresis.   2. Persistent Afib: Currently rate controlled with HR in the 70's. He has not missed any doses of his Eliquis. He would do better in sinus rhythm given his cardiomyopathy. Given that he has now been adequately anticoagulated schedule for DCCV the following week. Given his severely dilated left atrium he may require an antiarrhythmic to hold sinus rhythm. Continue Lopressor and digoxin. Check digoxin level. CHADS2VASc of 0, thus s/p DCCV he  will not need to be on long term, full-dose anticoagulation after he completes 4 weeks if he maintains sinus rhythm.   3. Moderate mitral regurgitation: Monitor with outpatient echo.   4. History of NSVT: No symptoms currently. Continue Lopressor as above.   Disposition: F/u with me or Dr. Kirke Corin in 1 month  Current medicines are reviewed at length with the patient today.  The patient did not have any concerns regarding medicines.  Elinor Dodge PA-C 01/02/2016 1:24 PM     [facility] [place] [address] [place], [place] [address] [phone]

## 2016-01-02 NOTE — Patient Instructions (Addendum)
Medication Instructions:  Your physician has recommended you make the following change in your medication:  INCREASE lasix today through Sunday to  twice daily. Resume normal dosage of  daily on Monday. START taking potassium  daily   Labwork: BMET, CBC, PT/INR, dig level today  Testing/Procedures: Your physician has recommended that you have a Cardioversion (DCCV). Electrical Cardioversion uses a jolt of electricity to your heart either through paddles or wired patches attached to your chest. This is a controlled, usually prescheduled, procedure. Defibrillation is done under light anesthesia in the hospital, and you usually go home the day of the procedure. This is done to get your heart back into a normal rhythm. You are not awake for the procedure. Please see the instruction sheet given to you today.  Your cardioversion has been scheduled for Friday, April 7. Arrival time 6:30am [facility] Entrance Please check in at Registration (1st desk on the right) Nothing to eat or drink after midnight the evening before however, you need to take your morning medications with a sip of water  You need to have an EKG in our office Thursday, April 6 at __________________  Follow-Up: Your physician recommends that you schedule a follow-up appointment with Dr. Kirke Corin after your cardioversion    Any Other Special Instructions Will Be Listed Below (If Applicable).     If you need a refill on your cardiac medications before your next appointment, please call your pharmacy.  Electrical Cardioversion Electrical cardioversion is the delivery of a jolt of electricity to change the rhythm of the heart. Sticky patches or metal paddles are placed on the chest to deliver the electricity from a device. This is done to restore a normal rhythm. A rhythm that is too fast or not regular keeps the heart from pumping well. Electrical cardioversion is done in an  emergency if:   There is low or no blood pressure as a result of the heart rhythm.   Normal rhythm must be restored as fast as possible to protect the brain and heart from further damage.   It may save a life. Cardioversion may be done for heart rhythms that are not immediately life threatening, such as atrial fibrillation or flutter, in which:   The heart is beating too fast or is not regular.   Medicine to change the rhythm has not worked.   It is safe to wait in order to allow time for preparation.  Symptoms of the abnormal rhythm are bothersome.  The risk of stroke and other serious problems can be reduced. LET [facility] CARE PROVIDER KNOW ABOUT:   Any allergies you have.  All medicines you are taking, including vitamins, herbs, eye drops, creams, and over-the-counter medicines.  Previous problems you or members of your family have had with the use of anesthetics.   Any blood disorders you have.   Previous surgeries you have had.   Medical conditions you have. RISKS AND COMPLICATIONS  Generally, this is a safe procedure. However, problems can occur and include:   Breathing problems related to the anesthetic used.  A blood clot that breaks free and travels to other parts of your body. This could cause a stroke or other problems. The risk of this is lowered by use of blood-thinning medicine (anticoagulant) prior to the procedure.  Cardiac arrest (rare). BEFORE THE PROCEDURE   You may have tests to detect blood clots in your heart and to evaluate heart function.  You may start taking anticoagulants  so your blood does not clot as easily.   Medicines may be given to help stabilize your heart rate and rhythm. PROCEDURE  You will be given medicine through an IV tube to reduce discomfort and make you sleepy (sedative).   An electrical shock will be delivered. AFTER THE PROCEDURE Your heart rhythm will be watched to make sure it does not change.    This  information is not intended to replace advice given to you by your health care provider. Make sure you discuss any questions you have with your health care provider.   Document Released: 09/11/2002 Document Revised: 10/12/2014 Document Reviewed: 04/05/2013 Elsevier Interactive Patient Education 2016 [employer]. Electrical Cardioversion, Care After Refer to this sheet in the next few weeks. These instructions provide you with information on caring for yourself after your procedure. Your health care provider may also give you more specific instructions. Your treatment has been planned according to current medical practices, but problems sometimes occur. Call your health care provider if you have any problems or questions after your procedure. WHAT TO EXPECT AFTER THE PROCEDURE After your procedure, it is typical to have the following sensations:  Some redness on the skin where the shocks were delivered. If this is tender, a sunburn lotion or hydrocortisone cream may help.  Possible return of an abnormal heart rhythm within hours or days after the procedure. HOME CARE INSTRUCTIONS  Take medicines only as directed by your health care provider. Be sure you understand how and when to take your medicine.  Learn how to feel your pulse and check it often.  Limit your activity for 48 hours after the procedure or as directed by your health care provider.  Avoid or minimize caffeine and other stimulants as directed by your health care provider. SEEK MEDICAL CARE IF:  You feel like your heart is beating too fast or your pulse is not regular.  You have any questions about your medicines.  You have bleeding that will not stop. SEEK IMMEDIATE MEDICAL CARE IF:  You are dizzy or feel faint.  It is hard to breathe or you feel short of breath.  There is a change in discomfort in your chest.  Your speech is slurred or you have trouble moving an arm or leg on one side of your body.  You get a  serious muscle cramp that does not go away.  Your fingers or toes turn cold or blue.   This information is not intended to replace advice given to you by your health care provider. Make sure you discuss any questions you have with your health care provider.   Document Released: 07/12/2013 Document Revised: 10/12/2014 Document Reviewed: 07/12/2013 Elsevier Interactive Patient Education [employer]

## 2016-01-03 LAB — CBC
HEMATOCRIT: 42.7 % (ref 37.5–51.0)
HEMOGLOBIN: 14.6 g/dL (ref 12.6–17.7)
MCH: 30.9 pg (ref 26.6–33.0)
MCHC: 34.2 g/dL (ref 31.5–35.7)
MCV: 90 fL (ref 79–97)
Platelets: 177 10*3/uL (ref 150–379)
RBC: 4.73 x10E6/uL (ref 4.14–5.80)
RDW: 13.7 % (ref 12.3–15.4)
WBC: 6.9 10*3/uL (ref 3.4–10.8)

## 2016-01-03 LAB — BASIC METABOLIC PANEL
BUN / CREAT RATIO: 20 (ref 9–20)
BUN: 16 mg/dL (ref 6–24)
CALCIUM: 8.9 mg/dL (ref 8.7–10.2)
CHLORIDE: 97 mmol/L (ref 96–106)
CO2: 23 mmol/L (ref 18–29)
CREATININE: 0.79 mg/dL (ref 0.76–1.27)
GFR calc non Af Amer: 101 mL/min/{1.73_m2} (ref >59)
GFR, EST AFRICAN AMERICAN: 117 mL/min/{1.73_m2} (ref >59)
GLUCOSE: 87 mg/dL (ref 65–99)
Potassium: 4.6 mmol/L (ref 3.5–5.2)
Sodium: 140 mmol/L (ref 134–144)

## 2016-01-03 LAB — PROTIME-INR
INR: 1 (ref 0.8–1.2)
PROTHROMBIN TIME: 10.6 s (ref 9.1–12.0)

## 2016-01-03 LAB — DIGOXIN LEVEL: DIGOXIN, SERUM: 0.8 ng/mL (ref 0.5–0.9)

## 2016-01-03 NOTE — Telephone Encounter (Signed)
Patient calling to check status of std papers .  Per Patient forms were sent back to office for signature yesterday.  Requested nicole from [employer] send another copy as i do not see a fax .

## 2016-01-03 NOTE — Telephone Encounter (Signed)
STD form given to Dr. Mariah Milling for review and signature

## 2016-01-03 NOTE — Telephone Encounter (Signed)
Received fax from nicole and placed in nurse box with signature page on top.

## 2016-01-03 NOTE — Telephone Encounter (Signed)
STD form given to Hiraa to fax to [facility]

## 2016-01-03 NOTE — Telephone Encounter (Signed)
Short term disability claim form awaiting MD signature. Placed in [employer] in box on desk.

## 2016-01-06 ENCOUNTER — Other Ambulatory Visit: Payer: Self-pay

## 2016-01-06 MED ORDER — FUROSEMIDE 40 MG PO TABS: 40.0000 mg | ORAL_TABLET | Freq: Every day | ORAL | Status: DC

## 2016-01-06 MED ORDER — DIGOXIN 125 MCG PO TABS: 0.1250 mg | ORAL_TABLET | Freq: Every day | ORAL | Status: DC

## 2016-01-09 ENCOUNTER — Ambulatory Visit (INDEPENDENT_AMBULATORY_CARE_PROVIDER_SITE_OTHER): Payer: BLUE CROSS/BLUE SHIELD

## 2016-01-09 VITALS — BP 120/80 | HR 70 | Ht 73.0 in | Wt 240.0 lb

## 2016-01-09 DIAGNOSIS — I4891 Unspecified atrial fibrillation: Secondary | ICD-10-CM

## 2016-01-09 NOTE — Progress Notes (Signed)
1.) Reason for visit: EKG  2.) Name of MD requesting visit: Eula Listen, PA  3.) H&P: Pt sched for DCCV tomorrow 01/10/16 @ 7:30.  4.) ROS related to problem: Pt has no complaints today, reports that he feels fine. EKG shows afib w/ HR 70.  5.) Assessment and plan per MD: Advised pt to keep appt as scheduled for DCCV tomorrow.  Reprinted and reviewed instructions.  He verbalizes understanding and repeats back to me.

## 2016-01-10 ENCOUNTER — Ambulatory Visit
Admission: RE | Admit: 2016-01-10 | Discharge: 2016-01-10 | Disposition: A | Discharge status: Home / Self Care | Payer: BLUE CROSS/BLUE SHIELD | Source: Ambulatory Visit | Attending: Cardiovascular Disease | Admitting: Cardiovascular Disease

## 2016-01-10 ENCOUNTER — Telehealth: Payer: Self-pay | Admitting: Cardiovascular Disease

## 2016-01-10 ENCOUNTER — Ambulatory Visit: Payer: BLUE CROSS/BLUE SHIELD | Admitting: Anesthesiology

## 2016-01-10 ENCOUNTER — Encounter: Payer: Self-pay | Admitting: *Deleted

## 2016-01-10 ENCOUNTER — Encounter
Admission: RE | Disposition: A | Discharge status: Home / Self Care | Payer: Self-pay | Source: Ambulatory Visit | Attending: Cardiovascular Disease

## 2016-01-10 DIAGNOSIS — I472 Ventricular tachycardia: Secondary | ICD-10-CM | POA: Diagnosis not present

## 2016-01-10 DIAGNOSIS — I340 Nonrheumatic mitral (valve) insufficiency: Secondary | ICD-10-CM | POA: Insufficient documentation

## 2016-01-10 DIAGNOSIS — I481 Persistent atrial fibrillation: Secondary | ICD-10-CM | POA: Insufficient documentation

## 2016-01-10 DIAGNOSIS — I429 Cardiomyopathy, unspecified: Secondary | ICD-10-CM | POA: Diagnosis not present

## 2016-01-10 DIAGNOSIS — E8770 Fluid overload, unspecified: Secondary | ICD-10-CM | POA: Diagnosis not present

## 2016-01-10 DIAGNOSIS — I5023 Acute on chronic systolic (congestive) heart failure: Secondary | ICD-10-CM | POA: Diagnosis not present

## 2016-01-10 DIAGNOSIS — Z7901 Long term (current) use of anticoagulants: Secondary | ICD-10-CM | POA: Insufficient documentation

## 2016-01-10 DIAGNOSIS — I480 Paroxysmal atrial fibrillation: Secondary | ICD-10-CM | POA: Insufficient documentation

## 2016-01-10 DIAGNOSIS — I4819 Other persistent atrial fibrillation: Secondary | ICD-10-CM

## 2016-01-10 DIAGNOSIS — I5022 Chronic systolic (congestive) heart failure: Secondary | ICD-10-CM | POA: Diagnosis not present

## 2016-01-10 HISTORY — PX: ELECTROPHYSIOLOGIC STUDY: SHX172A

## 2016-01-10 SURGERY — CARDIOVERSION (CATH LAB)
Anesthesia: General

## 2016-01-10 MED ORDER — SODIUM CHLORIDE 0.9 % IV SOLN
INTRAVENOUS | Status: DC
  Administered 2016-01-10: 07:00:00 via INTRAVENOUS

## 2016-01-10 MED ORDER — PROPOFOL 10 MG/ML IV BOLUS
INTRAVENOUS | Status: DC | PRN
  Administered 2016-01-10: 100 mg via INTRAVENOUS

## 2016-01-10 MED ORDER — METOPROLOL TARTRATE 100 MG PO TABS: 50.0000 mg | ORAL_TABLET | Freq: Two times a day (BID) | ORAL | Status: DC

## 2016-01-10 NOTE — Anesthesia Preprocedure Evaluation (Signed)
Anesthesia Evaluation  Patient identified by MRN, date of birth, ID band Patient awake    Reviewed: Allergy & Precautions, NPO status , Patient's Chart, lab work & pertinent test results, reviewed documented beta blocker date and time   Airway Mallampati: III  TM Distance: >3 FB     Dental  (+) Chipped   Pulmonary pneumonia, resolved,           Cardiovascular hypertension, Pt. on medications and Pt. on home beta blockers +CHF  + dysrhythmias Atrial Fibrillation      Neuro/Psych    GI/Hepatic   Endo/Other    Renal/GU      Musculoskeletal   Abdominal   Peds  Hematology   Anesthesia Other Findings Obese. EF 20. Cardiomyopathy.  Reproductive/Obstetrics                             Anesthesia Physical Anesthesia Plan  ASA: III  Anesthesia Plan: General   Post-op Pain Management:    Induction: Intravenous  Airway Management Planned: Nasal Cannula  Additional Equipment:   Intra-op Plan:   Post-operative Plan:   Informed Consent: I have reviewed the patients History and Physical, chart, labs and discussed the procedure including the risks, benefits and alternatives for the proposed anesthesia with the patient or authorized representative who has indicated his/her understanding and acceptance.     Plan Discussed with: CRNA  Anesthesia Plan Comments:         Anesthesia Quick Evaluation

## 2016-01-10 NOTE — Discharge Instructions (Signed)
Electrical Cardioversion, Care After °Refer to this sheet in the next few weeks. These instructions provide you with information on caring for yourself after your procedure. Your health care provider may also give you more specific instructions. Your treatment has been planned according to current medical practices, but problems sometimes occur. Call your health care provider if you have any problems or questions after your procedure. °WHAT TO EXPECT AFTER THE PROCEDURE °After your procedure, it is typical to have the following sensations: °· Some redness on the skin where the shocks were delivered. If this is tender, a sunburn lotion or hydrocortisone cream may help. °· Possible return of an abnormal heart rhythm within hours or days after the procedure. °HOME CARE INSTRUCTIONS °· Take medicines only as directed by your health care provider. Be sure you understand how and when to take your medicine. °· Learn how to feel your pulse and check it often. °· Limit your activity for 48 hours after the procedure or as directed by your health care provider. °· Avoid or minimize caffeine and other stimulants as directed by your health care provider. °SEEK MEDICAL CARE IF: °· You feel like your heart is beating too fast or your pulse is not regular. °· You have any questions about your medicines. °· You have bleeding that will not stop. °SEEK IMMEDIATE MEDICAL CARE IF: °· You are dizzy or feel faint. °· It is hard to breathe or you feel short of breath. °· There is a change in discomfort in your chest. °· Your speech is slurred or you have trouble moving an arm or leg on one side of your body. °· You get a serious muscle cramp that does not go away. °· Your fingers or toes turn cold or blue. °  °This information is not intended to replace advice given to you by your health care provider. Make sure you discuss any questions you have with your health care provider. °  °Document Released: 07/12/2013 Document Revised: 10/12/2014  Document Reviewed: 07/12/2013 °Elsevier Interactive Patient Education ©2016 Elsevier Inc. ° °

## 2016-01-10 NOTE — Transfer of Care (Signed)
Immediate Anesthesia Transfer of Care Note  Patient: Walter Banks  Procedure(s) Performed: Procedure(s): CARDIOVERSION (N/A)  Patient Location: PACU and Cath Lab  Anesthesia Type:General  Level of Consciousness: awake, alert  and oriented  Airway & Oxygen Therapy: Patient Spontanous Breathing and Patient connected to nasal cannula oxygen  Post-op Assessment: Report given to RN and Post -op Vital signs reviewed and stable  Post vital signs: Reviewed and stable  Last Vitals:  Filed Vitals:   01/10/16 0746 01/10/16 0747  BP: 99/71 99/71  Pulse: 48 49  Temp:    Resp: 22 17    Complications: No apparent anesthesia complications

## 2016-01-10 NOTE — H&P (View-Only) (Signed)
Cardiology Office Note Date:  01/02/2016  Patient ID:  Lavel, Rieman Apr 24, 1961, MRN [id number] PCP:  Nicki Reaper, NP  Cardiologist:  Dr. Kirke Corin, MD    Chief Complaint: Hospital follow up  History of Present Illness: Walter Banks is a 55 y.o. male with history of persistent Afib on Eliquis, chronic systolic CHF/NICM, history of VT/NSVT, and moderate mitral regurgitation who presents for hospital follow up of recent admission to [facility] from 3/5 to 3/10 for acute systolic CHF and Afib with RVR.   Patient had recently been treated for PNA and H pylori. He was admitted for 6 week history of weakness, dyspnea, and orthopnea. He was found to be in Afib with RVR with HR in the 140's and acute systolic CHF with a 10 pound weight gain and lower extremity edema. He did not feel his palpitations. He was noted to be tachycardic at prior PCP visits, no ECG for review at those times. CXR showed pulmonary edema. He diuresed 15.9 liters for the admission with a discharge weight of 221 pounds. Echo showed EF 20-25%, diffuse HK, moderate MR, left atrium severely dilated at 51 mm, RV mildly dilated with mildly reduced systolic function, RA severely dilated, PASP 50 mm Hg. Right and left cardiac cath on 3/7 showed moderate proximal LAD disease with 40% stenosis otherwise no significant stenosis. Right heart cath showed severely elevated right heart pressures including wedge pressure. His beta blocker was titrated up. His soft BP precluded ACEi/ARB/ARNI/spiro. He was started on Eliquis and the above BB for his Afib with plans for DCCV s/p 3 weeks of anticoagulation. He did have a 38 beat run of VT with associated flushing. No further episodes with titration of BB.   In follow up with CHF clinic on 12/18/15 his weight was 232 pounds. He was noted to be SOB and fatigued though he felt like these were improving. His soft BP of 97 systolic precluded addition of further HF medications including Entresto. No  changes were made.  Today, he notes continued weight gain and SOB. Current weights at home have gone from 223 on 3/11 to 240 on 3/29. Weight here is 240 pounds. He has been following a HF diet consisting of no salt and fluid restrictions. Currently taking his medications as directed and has not missed any doses, including no missed doses of his Eliquis. He has not noted any tachy-palpitations. No orthopnea, PND, or early satiety. No symptoms similar to what brought him to the hospital the prior time.    Past Medical History  Diagnosis Date  . Pneumonia   . H. pylori infection   . Persistent atrial fibrillation (HCC) 12/2015    a. on Eliquis; CHADS2VASc = 1 (CHF)  . Chronic systolic CHF (congestive heart failure) (HCC) 12/2015    a. 12/08/2015: echo showing EF of 20-25% w/ diffuse hypokinesis, severely dilated LA and RA, moderate MR and TR, PA peak pressure of 50 mmHg  . NICM (nonischemic cardiomyopathy) (HCC)     a. cardiac cath 12/10/15: pLAD 40%, o/w no stenosis, severely elevated right heart pressures   . VT (ventricular tachycardia) (HCC)     a. 38 beat run during admission 12/2015; b. associated with flushing   . Mitral regurgitation     a. echo as above    Past Surgical History  Procedure Laterality Date  . Wrist surgery Left 2013  . Cardiac catheterization N/A 12/10/2015    Procedure: Right and Left Heart Cath;  Surgeon: Antonieta Iba, MD;  Location: [facility] INVASIVE CV LAB;  Service: Cardiovascular;  Laterality: N/A;    Current Outpatient Prescriptions  Medication Sig Dispense Refill  . apixaban (ELIQUIS) 5 MG TABS tablet Take 1 tablet (5 mg total) by mouth 2 (two) times daily. 60 tablet 3  . digoxin (LANOXIN) 0.25 MG tablet Take 1 tablet (0.25 mg total) by mouth daily. 30 tablet 3  . furosemide (LASIX) 40 MG tablet Take 1 tablet (40 mg total) by mouth daily. 30 tablet 3  . metoprolol (LOPRESSOR) 100 MG tablet Take 1 tablet (100 mg total) by mouth 2 (two) times daily. 60 tablet 2  .  multivitamin (ONE-A-DAY MEN'S) TABS tablet Take 1 tablet by mouth daily.     No current facility-administered medications for this visit.    Allergies:   Review of patient's allergies indicates no known allergies.   Social History:  The patient  reports that he has never smoked. He has never used smokeless tobacco. He reports that he drinks about 4.8 oz of alcohol per week. He reports that he does not use illicit drugs.   Family History:  The patient's family history includes Diabetes in his brother, father, and mother; Heart attack in his father; Heart disease in his mother; Heart failure in his sister; Hypertension in his father. There is no history of Cancer or Stroke.  ROS:   Review of Systems  Constitutional: Positive for malaise/fatigue. Negative for fever, chills, weight loss and diaphoresis.  HENT: Negative for congestion.   Eyes: Negative for discharge and redness.  Respiratory: Positive for cough and shortness of breath. Negative for hemoptysis, sputum production and wheezing.   Cardiovascular: Positive for leg swelling. Negative for chest pain, palpitations, orthopnea, claudication and PND.  Gastrointestinal: Negative for nausea, vomiting and abdominal pain.  Musculoskeletal: Negative for falls.  Skin: Negative for rash.  Neurological: Positive for weakness. Negative for sensory change, speech change, focal weakness and loss of consciousness.  Endo/Heme/Allergies: Does not bruise/bleed easily.  Psychiatric/Behavioral: The patient is not nervous/anxious.       PHYSICAL EXAM:  VS:  Ht  (1.854 m)  Wt 240 lb 8 oz (109.09 kg)  BMI 31.74 kg/m2 BMI: Body mass index is 31.74 kg/(m^2). Well nourished, well developed, in no acute distress HEENT: normocephalic, atraumatic Neck: no JVD, carotid bruits or masses Cardiac: irregularly irregular S1, S2; RRR; no murmurs, rubs, or gallops Lungs:  clear to auscultation bilaterally, no wheezing, rhonchi or rales Abd: soft, nontender,  no hepatomegaly, + BS MS: no deformity or atrophy Ext: 1+ bilateral lower extremity edema to the mid shins Skin: warm and dry, no rash Neuro:  moves all extremities spontaneously, no focal abnormalities noted, follows commands Psych: euthymic mood, full affect   EKG:  Was ordered today. Shows Afib, 74 bpm, poor R wave progression, nonspecific inferolateral st/t changes  Recent Labs: 12/08/2015: ALT 43; B Natriuretic Peptide 673.0*; TSH 1.506 12/10/2015: Hemoglobin 12.6*; Platelets 141* 12/12/2015: Magnesium 2.0 12/13/2015: BUN 23*; Creatinine, Ser 1.05; Potassium 4.0; Sodium 138  12/10/2015: Cholesterol 181; HDL 32*; LDL Cholesterol 133*; Total CHOL/HDL Ratio 5.7; Triglycerides 81; VLDL 16   Estimated Creatinine Clearance: 103 mL/min (by C-G formula based on Cr of 1.05).   Wt Readings from Last 3 Encounters:  01/02/16 240 lb 8 oz (109.09 kg)  12/18/15 232 lb (105.235 kg)  12/13/15 221 lb 1.6 oz (100.29 kg)     Other studies reviewed: Additional studies/records reviewed today include: summarized above  ASSESSMENT AND PLAN:  1. Acute on chronic systolic CHF/NICM:  Mildly volume overloaded. Has been drinking large amounts of water 2/2 thirst. Increase Lasix to 40 mg bid through 4/3. Check bmet today and again the first of next week s/p diuresis. Add KCl repletion. Decrease PO fluid consumption. He has been watching his salt intake. BP almost stable enough for Entresto. I will hold on starting this vs ACEi at this time given the need for the above diuresis.   2. Persistent Afib: Currently rate controlled with HR in the 70's. He has not missed any doses of his Eliquis. He would do better in sinus rhythm given his cardiomyopathy. Given that he has now been adequately anticoagulated schedule for DCCV the following week. Given his severely dilated left atrium he may require an antiarrhythmic to hold sinus rhythm. Continue Lopressor and digoxin. Check digoxin level. CHADS2VASc of 0, thus s/p DCCV he  will not need to be on long term, full-dose anticoagulation after he completes 4 weeks if he maintains sinus rhythm.   3. Moderate mitral regurgitation: Monitor with outpatient echo.   4. History of NSVT: No symptoms currently. Continue Lopressor as above.   Disposition: F/u with me or Dr. Kirke Corin in 1 month  Current medicines are reviewed at length with the patient today.  The patient did not have any concerns regarding medicines.  Elinor Dodge PA-C 01/02/2016 1:24 PM     [facility] [place] [address] [place], [place] [address] [phone]

## 2016-01-10 NOTE — Telephone Encounter (Signed)
Received records request [employer], forwarded to [facility] for processing.

## 2016-01-10 NOTE — CV Procedure (Signed)
Cardioversion note: A standard informed consent was obtained. Timeout was performed. The pads were placed in the anterior posterior fashion. The patient was given propofol by the anesthesia team.  Successful cardioversion was performed with a 200 J. The patient converted to sinus rhythm. Pre-and post EKGs were reviewed. The patient tolerated the procedure with no immediate complications.  Recommendations: Continue same medications and follow-up in 2-3 weeks.  

## 2016-01-10 NOTE — Anesthesia Postprocedure Evaluation (Signed)
Anesthesia Post Note  Patient: Walter Banks  Procedure(s) Performed: Procedure(s) (LRB): CARDIOVERSION (N/A)  Patient location during evaluation: Cath Lab Anesthesia Type: General Level of consciousness: awake and alert Pain management: pain level controlled Vital Signs Assessment: post-procedure vital signs reviewed and stable Respiratory status: spontaneous breathing, nonlabored ventilation, respiratory function stable and patient connected to nasal cannula oxygen Cardiovascular status: blood pressure returned to baseline and stable Postop Assessment: no signs of nausea or vomiting Anesthetic complications: no    Last Vitals:  Filed Vitals:   01/10/16 0815 01/10/16 0830  BP: 103/78 110/76  Pulse: 46 60  Temp:    Resp: 18 18    Last Pain: There were no vitals filed for this visit.               Rodolfo Notaro S

## 2016-01-10 NOTE — Interval H&P Note (Signed)
History and Physical Interval Note:  01/10/2016 7:36 AM  Walter Banks  has presented today for surgery, with the diagnosis of Afib  The various methods of treatment have been discussed with the patient and family. After consideration of risks, benefits and other options for treatment, the patient has consented to  Procedure(s): CARDIOVERSION (N/A) as a surgical intervention .  The patient's history has been reviewed, patient examined, no change in status, stable for surgery.  I have reviewed the patient's chart and labs.  Questions were answered to the patient's satisfaction.     Lorine Bears

## 2016-01-16 ENCOUNTER — Telehealth: Payer: Self-pay | Admitting: Cardiovascular Disease

## 2016-01-16 ENCOUNTER — Encounter: Payer: Self-pay | Admitting: Cardiovascular Disease

## 2016-01-16 ENCOUNTER — Ambulatory Visit (INDEPENDENT_AMBULATORY_CARE_PROVIDER_SITE_OTHER): Payer: BLUE CROSS/BLUE SHIELD | Admitting: Cardiovascular Disease

## 2016-01-16 VITALS — BP 120/74 | HR 50 | Ht 73.0 in | Wt 246.8 lb

## 2016-01-16 DIAGNOSIS — I5022 Chronic systolic (congestive) heart failure: Secondary | ICD-10-CM | POA: Diagnosis not present

## 2016-01-16 MED ORDER — SACUBITRIL-VALSARTAN 24-26 MG PO TABS: 1.0000 | ORAL_TABLET | Freq: Two times a day (BID) | ORAL | Status: DC

## 2016-01-16 NOTE — Telephone Encounter (Signed)
Alveta Heimlich prior authorization to [place], [phone]

## 2016-01-16 NOTE — Telephone Encounter (Signed)
Informed pt Entresto samples are at the front desk. Awaiting BCBC approval then pt understands to call [employer] for copay assistance. Pt will pick up samples today.

## 2016-01-16 NOTE — Telephone Encounter (Signed)
Patient disability benefits forms sent to nicole at [facility] with ROI and $25 check

## 2016-01-16 NOTE — Patient Instructions (Addendum)
Medication Instructions:  Your physician has recommended you make the following change in your medication: START taking entresto 24/26mg  twice daily   Labwork: BMET in one week  Testing/Procedures: none  Follow-Up: Your physician recommends that you schedule a follow-up appointment in: one month with Dr. Kirke Corin.    Any Other Special Instructions Will Be Listed Below (If Applicable). Per Dr. Kirke Corin, you do not need to go to your appt at the Heart Failure clinic next week.      If you need a refill on your cardiac medications before your next appointment, please call your pharmacy.

## 2016-01-16 NOTE — Progress Notes (Signed)
Cardiology Office Note   Date:  01/16/2016   ID:  RAHEEN CAPILI, DOB January 09, 1961, MRN [id number]  PCP:  Nicki Reaper, NP  Cardiologist:   Lorine Bears, MD   Chief Complaint  Patient presents with  . chronic systolic heart failure    SOB  & some dizziness      History of Present Illness: Walter Banks is a 55 y.o. male who presents for a follow-up visit regarding chronic systolic heart failure due to nonischemic cardiomyopathy, persistent atrial fibrillation status post recent cardioversion and moderate nonobstructive one-vessel coronary artery disease.  The patient was hospitalized at [facility] in March with congestive heart failure and atrial fibrillation.  Echo showed EF 20-25%, diffuse HK, moderate MR, left atrium severely dilated at 51 mm, RV mildly dilated with mildly reduced systolic function, RA severely dilated, PASP 50 mm Hg. Right and left cardiac cath on 3/7 showed moderate proximal LAD disease with 40% stenosis otherwise no significant stenosis. Right heart cath showed severely elevated right heart pressures including wedge pressure.   He underwent successful cardioversion to sinus rhythm recently. He is gradually feeling better. No chest pain. No palpitations. He continues to have significant exertional dyspnea with normal activities. No orthopnea or PND. No leg edema although he is still gaining weight. He feels that most of the fluid is in his abdomen. The patient has been off work. He is a [employer].    Past Medical History  Diagnosis Date  . Pneumonia   . H. pylori infection   . Persistent atrial fibrillation (HCC) 12/2015    a. on Eliquis; CHADS2VASc = 1 (CHF)  . Chronic systolic CHF (congestive heart failure) (HCC) 12/2015    a. 12/08/2015: echo showing EF of 20-25% w/ diffuse hypokinesis, severely dilated LA and RA, moderate MR and TR, PA peak pressure of 50 mmHg  . NICM (nonischemic cardiomyopathy) (HCC)     a. cardiac cath 12/10/15: pLAD 40%, o/w no  stenosis, severely elevated right heart pressures   . VT (ventricular tachycardia) (HCC)     a. 38 beat run during admission 12/2015; b. associated with flushing   . Mitral regurgitation     a. echo as above    Past Surgical History  Procedure Laterality Date  . Wrist surgery Left 2013  . Cardiac catheterization N/A 12/10/2015    Procedure: Right and Left Heart Cath;  Surgeon: Antonieta Iba, MD;  Location: [facility] INVASIVE CV LAB;  Service: Cardiovascular;  Laterality: N/A;  . Electrophysiologic study N/A 01/10/2016    Procedure: CARDIOVERSION;  Surgeon: Iran Ouch, MD;  Location: [facility] ORS;  Service: Cardiovascular;  Laterality: N/A;     Current Outpatient Prescriptions  Medication Sig Dispense Refill  . apixaban (ELIQUIS) 5 MG TABS tablet Take 1 tablet (5 mg total) by mouth 2 (two) times daily. 60 tablet 3  . digoxin (LANOXIN) 0.125 MG tablet Take 1 tablet (0.125 mg total) by mouth daily. 30 tablet 5  . furosemide (LASIX) 40 MG tablet Take 1 tablet (40 mg total) by mouth daily. 30 tablet 3  . metoprolol (LOPRESSOR) 100 MG tablet Take 0.5 tablets (50 mg total) by mouth 2 (two) times daily. 60 tablet 2  . multivitamin (ONE-A-DAY MEN'S) TABS tablet Take 1 tablet by mouth daily.    . potassium chloride 20 MEQ TBCR Take 20 mEq by mouth daily. 30 tablet 5  . sacubitril-valsartan (ENTRESTO) 24-26 MG Take 1 tablet by mouth 2 (two) times daily. 60 tablet 6  No current facility-administered medications for this visit.    Allergies:   Review of patient's allergies indicates no known allergies.    Social History:  The patient  reports that he has never smoked. He has never used smokeless tobacco. He reports that he drinks about 4.8 oz of alcohol per week. He reports that he does not use illicit drugs.   Family History:  The patient's family history includes Diabetes in his brother, father, and mother; Heart attack in his father; Heart disease in his mother; Heart failure in his sister;  Hypertension in his father. There is no history of Cancer or Stroke.    ROS:  Please see the history of present illness.   Otherwise, review of systems are positive for none.   All other systems are reviewed and negative.    PHYSICAL EXAM: VS:  BP 120/74 mmHg  Pulse 50  Ht 6\' 1"  (1.854 m)  Wt 246 lb 12.8 oz (111.948 kg)  BMI 32.57 kg/m2  SpO2 95% , BMI Body mass index is 32.57 kg/(m^2). GEN: Well nourished, well developed, in no acute distress HEENT: normal Neck: no JVD, carotid bruits, or masses Cardiac: RRR; no murmurs, rubs, or gallops,no edema  Respiratory:  clear to auscultation bilaterally, normal work of breathing GI: soft, nontender, nondistended, + BS MS: no deformity or atrophy Skin: warm and dry, no rash Neuro:  Strength and sensation are intact Psych: euthymic mood, full affect   EKG:  EKG is ordered today. The ekg ordered today demonstrates sinus bradycardia with ST and T wave changes suggestive of inferior ischemia   Recent Labs: 12/08/2015: ALT 43; B Natriuretic Peptide 673.0*; TSH 1.506 12/10/2015: Hemoglobin 12.6* 12/12/2015: Magnesium 2.0 01/02/2016: BUN 16; Creatinine, Ser 0.79; Platelets 177; Potassium 4.6; Sodium 140    Lipid Panel    Component Value Date/Time   CHOL 181 12/10/2015 0157   TRIG 81 12/10/2015 0157   HDL 32* 12/10/2015 0157   CHOLHDL 5.7 12/10/2015 0157   VLDL 16 12/10/2015 0157   LDLCALC 133* 12/10/2015 0157      Wt Readings from Last 3 Encounters:  01/16/16 246 lb 12.8 oz (111.948 kg)  01/10/16 240 lb (108.863 kg)  01/09/16 240 lb (108.863 kg)       ASSESSMENT AND PLAN:  1.  Chronic systolic heart failure: Severely reduced LV systolic function with an ejection fraction of 20-25% due to nonischemic cardiomyopathy. He continues to be in [place] Heart Association class III even after cardioversion. Continue treatment with beta blocker and small dose digoxin. I elected to add small dose Entresto today. He does appear to be mildly  volume overloaded but I elected not to increase the dose of furosemide given the initiation of Entresto. Check basic metabolic profile in one week. The plan is to repeat his echocardiogram in June to reevaluate his ejection fraction. His ability to go back to work will be determined based on that. He can go back to work as ejection fraction is about 40%. 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  2. Persistent atrial fibrillation: Status post recent successful cardioversion to normal sinus rhythm. Given his low EF, I recommend continuing anticoagulation. If his ejection fraction normalizes in the future, anticoagulation can be discontinued.   3. Moderate mitral regurgitation: Likely due to dilated annulus.     Disposition:   FU with me in 1 month  Signed,  Lorine Bears, MD  01/16/2016 3:32 PM    [facility]

## 2016-01-16 NOTE — Telephone Encounter (Signed)
S/w Clifton Custard at [facility] who states pt has insurance through [employer] w/$0 copay. Pt does not have BCBS pharmacy benefits. S/w pt to make aware.

## 2016-01-20 ENCOUNTER — Ambulatory Visit: Payer: BLUE CROSS/BLUE SHIELD | Admitting: Family

## 2016-01-23 ENCOUNTER — Other Ambulatory Visit (INDEPENDENT_AMBULATORY_CARE_PROVIDER_SITE_OTHER): Payer: BLUE CROSS/BLUE SHIELD

## 2016-01-23 DIAGNOSIS — I5022 Chronic systolic (congestive) heart failure: Secondary | ICD-10-CM | POA: Diagnosis not present

## 2016-01-24 LAB — BASIC METABOLIC PANEL
BUN / CREAT RATIO: 18 (ref 9–20)
BUN: 15 mg/dL (ref 6–24)
CO2: 21 mmol/L (ref 18–29)
Calcium: 8.9 mg/dL (ref 8.7–10.2)
Chloride: 101 mmol/L (ref 96–106)
Creatinine, Ser: 0.82 mg/dL (ref 0.76–1.27)
GFR calc Af Amer: 115 mL/min/{1.73_m2} (ref >59)
GFR calc non Af Amer: 100 mL/min/{1.73_m2} (ref >59)
GLUCOSE: 102 mg/dL — AB (ref 65–99)
POTASSIUM: 4.6 mmol/L (ref 3.5–5.2)
SODIUM: 139 mmol/L (ref 134–144)

## 2016-02-04 ENCOUNTER — Telehealth: Payer: Self-pay | Admitting: Cardiovascular Disease

## 2016-02-04 NOTE — Telephone Encounter (Signed)
Received records request [employer], forwarded to [facility] for processing.

## 2016-02-21 ENCOUNTER — Telehealth: Payer: Self-pay | Admitting: Cardiovascular Disease

## 2016-02-21 NOTE — Telephone Encounter (Signed)
Received records request [employer], forwarded to [facility] for processing.

## 2016-02-27 ENCOUNTER — Ambulatory Visit (INDEPENDENT_AMBULATORY_CARE_PROVIDER_SITE_OTHER): Payer: BLUE CROSS/BLUE SHIELD | Admitting: Cardiovascular Disease

## 2016-02-27 ENCOUNTER — Telehealth: Payer: Self-pay | Admitting: Cardiovascular Disease

## 2016-02-27 ENCOUNTER — Encounter: Payer: Self-pay | Admitting: Cardiovascular Disease

## 2016-02-27 VITALS — BP 110/70 | HR 44 | Ht 73.0 in | Wt 244.0 lb

## 2016-02-27 DIAGNOSIS — I5022 Chronic systolic (congestive) heart failure: Secondary | ICD-10-CM | POA: Diagnosis not present

## 2016-02-27 DIAGNOSIS — I10 Essential (primary) hypertension: Secondary | ICD-10-CM | POA: Diagnosis not present

## 2016-02-27 DIAGNOSIS — I481 Persistent atrial fibrillation: Secondary | ICD-10-CM | POA: Diagnosis not present

## 2016-02-27 DIAGNOSIS — I4819 Other persistent atrial fibrillation: Secondary | ICD-10-CM

## 2016-02-27 MED ORDER — METOPROLOL SUCCINATE ER 50 MG PO TB24: 50.0000 mg | ORAL_TABLET | Freq: Every day | ORAL | Status: DC

## 2016-02-27 MED ORDER — SPIRONOLACTONE 25 MG PO TABS: 25.0000 mg | ORAL_TABLET | Freq: Every day | ORAL | Status: DC

## 2016-02-27 MED ORDER — ATORVASTATIN CALCIUM 20 MG PO TABS: 20.0000 mg | ORAL_TABLET | Freq: Every day | ORAL | Status: DC

## 2016-02-27 NOTE — Progress Notes (Signed)
Cardiology Office Note   Date:  02/27/2016   ID:  Walter Banks, DOB 09/26/1961, MRN [id number]  PCP:  Nicki Reaper, NP  Cardiologist:   Lorine Bears, MD   Chief Complaint  Patient presents with  . other    1 month F/U. Pt C/O of dizziness and fatigue. Medications verbally reviewed with pt.       History of Present Illness: Walter Banks is a 55 y.o. male who presents for a follow-up visit regarding chronic systolic heart failure due to nonischemic cardiomyopathy, persistent atrial fibrillation status post recent cardioversion and moderate nonobstructive one-vessel coronary artery disease.  The patient was hospitalized at [facility] in March with congestive heart failure and atrial fibrillation.  Echo showed EF 20-25%, diffuse HK, moderate MR, left atrium severely dilated at 51 mm, RV mildly dilated with mildly reduced systolic function, RA severely dilated, PASP 50 mm Hg. Right and left cardiac cath on 3/7 showed moderate proximal LAD disease with 40% stenosis otherwise no significant stenosis. Right heart cath showed severely elevated right heart pressures including wedge pressure.   He underwent successful cardioversion to sinus rhythm in April. He is gradually feeling better with no chest pain or significant dyspnea. His biggest problem seems to be extreme fatigue and dizziness especially when he stands up quickly.  The patient has been off work. He is a [employer].    Past Medical History  Diagnosis Date  . Pneumonia   . H. pylori infection   . Persistent atrial fibrillation (HCC) 12/2015    a. on Eliquis; CHADS2VASc = 1 (CHF)  . Chronic systolic CHF (congestive heart failure) (HCC) 12/2015    a. 12/08/2015: echo showing EF of 20-25% w/ diffuse hypokinesis, severely dilated LA and RA, moderate MR and TR, PA peak pressure of 50 mmHg  . NICM (nonischemic cardiomyopathy) (HCC)     a. cardiac cath 12/10/15: pLAD 40%, o/w no stenosis, severely elevated right heart pressures     . VT (ventricular tachycardia) (HCC)     a. 38 beat run during admission 12/2015; b. associated with flushing   . Mitral regurgitation     a. echo as above    Past Surgical History  Procedure Laterality Date  . Wrist surgery Left 2013  . Cardiac catheterization N/A 12/10/2015    Procedure: Right and Left Heart Cath;  Surgeon: Antonieta Iba, MD;  Location: [facility] INVASIVE CV LAB;  Service: Cardiovascular;  Laterality: N/A;  . Electrophysiologic study N/A 01/10/2016    Procedure: CARDIOVERSION;  Surgeon: Iran Ouch, MD;  Location: [facility] ORS;  Service: Cardiovascular;  Laterality: N/A;     Current Outpatient Prescriptions  Medication Sig Dispense Refill  . apixaban (ELIQUIS) 5 MG TABS tablet Take 1 tablet (5 mg total) by mouth 2 (two) times daily. 60 tablet 3  . digoxin (LANOXIN) 0.125 MG tablet Take 1 tablet (0.125 mg total) by mouth daily. 30 tablet 5  . furosemide (LASIX) 40 MG tablet Take 1 tablet (40 mg total) by mouth daily. 30 tablet 3  . metoprolol (LOPRESSOR) 100 MG tablet Take 0.5 tablets (50 mg total) by mouth 2 (two) times daily. 60 tablet 2  . multivitamin (ONE-A-DAY MEN'S) TABS tablet Take 1 tablet by mouth daily.    . potassium chloride 20 MEQ TBCR Take 20 mEq by mouth daily. 30 tablet 5  . sacubitril-valsartan (ENTRESTO) 24-26 MG Take 1 tablet by mouth 2 (two) times daily. 60 tablet 6   No current facility-administered medications for  this visit.    Allergies:   Review of patient's allergies indicates no known allergies.    Social History:  The patient  reports that he has never smoked. He has never used smokeless tobacco. He reports that he drinks about 4.8 oz of alcohol per week. He reports that he does not use illicit drugs.   Family History:  The patient's family history includes Diabetes in his brother, father, and mother; Heart attack in his father; Heart disease in his mother; Heart failure in his sister; Hypertension in his father. There is no history of  Cancer or Stroke.    ROS:  Please see the history of present illness.   Otherwise, review of systems are positive for none.   All other systems are reviewed and negative.    PHYSICAL EXAM: VS:  BP 110/70 mmHg  Pulse 44  Ht  (1.854 m)  Wt 244 lb (110.678 kg)  BMI 32.20 kg/m2 , BMI Body mass index is 32.2 kg/(m^2). GEN: Well nourished, well developed, in no acute distress HEENT: normal Neck: no JVD, carotid bruits, or masses Cardiac: RRR; no murmurs, rubs, or gallops,no edema  Respiratory:  clear to auscultation bilaterally, normal work of breathing GI: soft, nontender, nondistended, + BS MS: no deformity or atrophy Skin: warm and dry, no rash Neuro:  Strength and sensation are intact Psych: euthymic mood, full affect   EKG:  EKG is ordered today. The ekg ordered today demonstrates marked sinus bradycardia with a heart rate of 44 bpm and nonspecific T wave changes.   Recent Labs: 12/08/2015: ALT 43; B Natriuretic Peptide 673.0*; TSH 1.506 12/10/2015: Hemoglobin 12.6* 12/12/2015: Magnesium 2.0 01/02/2016: Platelets 177 01/23/2016: BUN 15; Creatinine, Ser 0.82; Potassium 4.6; Sodium 139    Lipid Panel    Component Value Date/Time   CHOL 181 12/10/2015 0157   TRIG 81 12/10/2015 0157   HDL 32* 12/10/2015 0157   CHOLHDL 5.7 12/10/2015 0157   VLDL 16 12/10/2015 0157   LDLCALC 133* 12/10/2015 0157      Wt Readings from Last 3 Encounters:  02/27/16 244 lb (110.678 kg)  01/16/16 246 lb 12.8 oz (111.948 kg)  01/10/16 240 lb (108.863 kg)       ASSESSMENT AND PLAN:  1.  Chronic systolic heart failure: Severely reduced LV systolic function with an ejection fraction of 20-25% due to nonischemic cardiomyopathy.  New York Heart Association improved to class II. His fatigue is likely due to extreme sinus bradycardia on metoprolol. I elected to switch metoprolol tartrate to metoprolol succinate 50 mg once daily with plan to transition him to carvedilol in the near  future. Continue small dose Entresto . I also added spironolactone 25 mg once daily and discontinued potassium supplement. Check basic metabolic profile in one week  The plan is to repeat his echocardiogram in late June to reevaluate his ejection fraction. His ability to go back to work will be determined based on that. 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 If ejection fraction is less than that, he might need to look for a different kind of job.  2. Persistent atrial fibrillation: He continues to be in sinus rhythm. I decreased metoprolol as outlined above. Continue anticoagulation.   3. Moderate mitral regurgitation: Likely due to dilated annulus.  4. Moderate one vessel coronary artery disease: No anginal symptoms. Continue medical therapy.  5. Hyperlipidemia: Most recent LDL was 133. Given his coronary artery disease, I added atorvastatin.  Disposition:   FU with me in 2 weeks.   Signed,  Lorine Bears, MD  02/27/2016 11:09 AM    [facility]

## 2016-02-27 NOTE — Telephone Encounter (Signed)
Please advise. See note below. 

## 2016-02-27 NOTE — Telephone Encounter (Signed)
[facility] calling letting us know about a drug interaction we sent in a refill for Spironolactone  Spironolactone is interacting with Patient's potassium that another provider prescribed.  Please call back.  Pharmacy is not sure if they need to fill this or not.

## 2016-02-27 NOTE — Telephone Encounter (Signed)
[facility] who states pt has potassium prescription from Eula Listen and new prescription for spironolactone received. Informed Kayla that potassium was d/c'd at today's office visit. Pt is aware. She verbalized understanding with no further questions.

## 2016-02-27 NOTE — Patient Instructions (Signed)
Medication Instructions:  Your physician has recommended you make the following change in your medication:  STOP taking potassium STOP taking metoprolol tartrate START taking spironolactone 25mg  once daily START taking atorvastatin 20mg  once daily START taking toprolol XL 50mg  once daily    Labwork: BMET in one week  Testing/Procedures: none  Follow-Up: Your physician recommends that you schedule a follow-up appointment in: two weeks with Dr. Kirke Corin.    Any Other Special Instructions Will Be Listed Below (If Applicable).     If you need a refill on your cardiac medications before your next appointment, please call your pharmacy.

## 2016-03-05 ENCOUNTER — Other Ambulatory Visit (INDEPENDENT_AMBULATORY_CARE_PROVIDER_SITE_OTHER): Payer: BLUE CROSS/BLUE SHIELD

## 2016-03-05 DIAGNOSIS — I10 Essential (primary) hypertension: Secondary | ICD-10-CM

## 2016-03-05 DIAGNOSIS — I4819 Other persistent atrial fibrillation: Secondary | ICD-10-CM

## 2016-03-05 DIAGNOSIS — I5022 Chronic systolic (congestive) heart failure: Secondary | ICD-10-CM

## 2016-03-06 LAB — BASIC METABOLIC PANEL
BUN/Creatinine Ratio: 19 (ref 9–20)
BUN: 20 mg/dL (ref 6–24)
CALCIUM: 9.3 mg/dL (ref 8.7–10.2)
CO2: 23 mmol/L (ref 18–29)
CREATININE: 1.05 mg/dL (ref 0.76–1.27)
Chloride: 98 mmol/L (ref 96–106)
GFR, EST AFRICAN AMERICAN: 92 mL/min/{1.73_m2} (ref >59)
GFR, EST NON AFRICAN AMERICAN: 80 mL/min/{1.73_m2} (ref >59)
Glucose: 109 mg/dL — ABNORMAL HIGH (ref 65–99)
Potassium: 4.3 mmol/L (ref 3.5–5.2)
Sodium: 138 mmol/L (ref 134–144)

## 2016-03-09 ENCOUNTER — Telehealth: Payer: Self-pay | Admitting: Cardiovascular Disease

## 2016-03-09 NOTE — Telephone Encounter (Signed)
-   Informed patient of release of information packet being mailed.  - Mailed release packet to patient. Also scanned paperwork from Walter Banks into chart

## 2016-03-09 NOTE — Telephone Encounter (Signed)
Received records request [facility] , forwarded to [facility] for processing.(Faxed and inner office)

## 2016-03-12 ENCOUNTER — Ambulatory Visit (INDEPENDENT_AMBULATORY_CARE_PROVIDER_SITE_OTHER): Payer: BLUE CROSS/BLUE SHIELD | Admitting: Cardiovascular Disease

## 2016-03-12 ENCOUNTER — Encounter: Payer: Self-pay | Admitting: Cardiovascular Disease

## 2016-03-12 VITALS — BP 118/68 | HR 47 | Ht 73.0 in | Wt 244.1 lb

## 2016-03-12 DIAGNOSIS — I5022 Chronic systolic (congestive) heart failure: Secondary | ICD-10-CM | POA: Diagnosis not present

## 2016-03-12 NOTE — Patient Instructions (Addendum)
Medication Instructions:  Your physician has recommended you make the following change in your medication:  STOP taking digoxin Only take metoprolol XL 50mg  once daily. Do not take metoprolol twice daily.   Labwork: none  Testing/Procedures: Your physician has requested that you have an echocardiogram. Echocardiography is a painless test that uses sound waves to create images of your heart. It provides your doctor with information about the size and shape of your heart and how well your heart's chambers and valves are working. This procedure takes approximately one hour. There are no restrictions for this procedure.    Follow-Up: Your physician recommends that you schedule a follow-up appointment in: one month with Dr. Kirke Corin.    Any Other Special Instructions Will Be Listed Below (If Applicable).     If you need a refill on your cardiac medications before your next appointment, please call your pharmacy.  Echocardiogram An echocardiogram, or echocardiography, uses sound waves (ultrasound) to produce an image of your heart. The echocardiogram is simple, painless, obtained within a short period of time, and offers valuable information to your health care provider. The images from an echocardiogram can provide information such as:  Evidence of coronary artery disease (CAD).  Heart size.  Heart muscle function.  Heart valve function.  Aneurysm detection.  Evidence of a past heart attack.  Fluid buildup around the heart.  Heart muscle thickening.  Assess heart valve function. LET [facility] CARE PROVIDER KNOW ABOUT:  Any allergies you have.  All medicines you are taking, including vitamins, herbs, eye drops, creams, and over-the-counter medicines.  Previous problems you or members of your family have had with the use of anesthetics.  Any blood disorders you have.  Previous surgeries you have had.  Medical conditions you have.  Possibility of pregnancy, if this  applies. BEFORE THE PROCEDURE  No special preparation is needed. Eat and drink normally.  PROCEDURE   In order to produce an image of your heart, gel will be applied to your chest and a wand-like tool (transducer) will be moved over your chest. The gel will help transmit the sound waves from the transducer. The sound waves will harmlessly bounce off your heart to allow the heart images to be captured in real-time motion. These images will then be recorded.  You may need an IV to receive a medicine that improves the quality of the pictures. AFTER THE PROCEDURE You may return to your normal schedule including diet, activities, and medicines, unless your health care provider tells you otherwise.   This information is not intended to replace advice given to you by your health care provider. Make sure you discuss any questions you have with your health care provider.   Document Released: 09/18/2000 Document Revised: 10/12/2014 Document Reviewed: 05/29/2013 Elsevier Interactive Patient Education [employer]

## 2016-03-12 NOTE — Progress Notes (Signed)
Cardiology Office Note   Date:  03/12/2016   ID:  CEASAR CLINKENBEARD, DOB 01-29-1961, MRN [id number]  PCP:  Nicki Reaper, NP  Cardiologist:   Lorine Bears, MD   Chief Complaint  Patient presents with  . other    2 week F/U. medications reviewed verbally with patient.       History of Present Illness: Walter Banks is a 55 y.o. male who presents for a follow-up visit regarding chronic systolic heart failure due to nonischemic cardiomyopathy, persistent atrial fibrillation status post recent cardioversion and moderate nonobstructive one-vessel coronary artery disease.  The patient was hospitalized at [facility] in March with congestive heart failure and atrial fibrillation.  Echo showed EF 20-25%, diffuse HK, moderate MR, left atrium severely dilated at 51 mm, RV mildly dilated with mildly reduced systolic function, RA severely dilated, PASP 50 mm Hg. Right and left cardiac cath on 3/7 showed moderate proximal LAD disease with 40% stenosis otherwise no significant stenosis. Right heart cath showed severely elevated right heart pressures including wedge pressure.   He underwent successful cardioversion to sinus rhythm in April. During last visit, he was noted to be bradycardic with significant orthostatic dizziness. Thus, I decreased the dose of metoprolol to 50 mg once daily. It appears that he continue to take both metoprolol tartrate to metoprolol succinate by mistake. He reports no chest pain and almost complete resolution of dyspnea.  The patient has been off work. He is a Naval architect.    Past Medical History  Diagnosis Date  . Pneumonia   . H. pylori infection   . Persistent atrial fibrillation (HCC) 12/2015    a. on Eliquis; CHADS2VASc = 1 (CHF)  . Chronic systolic CHF (congestive heart failure) (HCC) 12/2015    a. 12/08/2015: echo showing EF of 20-25% w/ diffuse hypokinesis, severely dilated LA and RA, moderate MR and TR, PA peak pressure of 50 mmHg  . NICM (nonischemic  cardiomyopathy) (HCC)     a. cardiac cath 12/10/15: pLAD 40%, o/w no stenosis, severely elevated right heart pressures   . VT (ventricular tachycardia) (HCC)     a. 38 beat run during admission 12/2015; b. associated with flushing   . Mitral regurgitation     a. echo as above    Past Surgical History  Procedure Laterality Date  . Wrist surgery Left 2013  . Cardiac catheterization N/A 12/10/2015    Procedure: Right and Left Heart Cath;  Surgeon: Antonieta Iba, MD;  Location: [facility] INVASIVE CV LAB;  Service: Cardiovascular;  Laterality: N/A;  . Electrophysiologic study N/A 01/10/2016    Procedure: CARDIOVERSION;  Surgeon: Iran Ouch, MD;  Location: [facility] ORS;  Service: Cardiovascular;  Laterality: N/A;     Current Outpatient Prescriptions  Medication Sig Dispense Refill  . apixaban (ELIQUIS) 5 MG TABS tablet Take 1 tablet (5 mg total) by mouth 2 (two) times daily. 60 tablet 3  . atorvastatin (LIPITOR) 20 MG tablet Take 1 tablet (20 mg total) by mouth daily. 30 tablet 3  . digoxin (LANOXIN) 0.125 MG tablet Take 1 tablet (0.125 mg total) by mouth daily. 30 tablet 5  . furosemide (LASIX) 40 MG tablet Take 1 tablet (40 mg total) by mouth daily. 30 tablet 3  . metoprolol succinate (TOPROL XL) 50 MG 24 hr tablet Take 1 tablet (50 mg total) by mouth daily. Take with or immediately following a meal. 30 tablet 3  . multivitamin (ONE-A-DAY MEN'S) TABS tablet Take 1 tablet by mouth daily.    Marland Kitchen  sacubitril-valsartan (ENTRESTO) 24-26 MG Take 1 tablet by mouth 2 (two) times daily. 60 tablet 6  . spironolactone (ALDACTONE) 25 MG tablet Take 1 tablet (25 mg total) by mouth daily. 30 tablet 3   No current facility-administered medications for this visit.    Allergies:   Review of patient's allergies indicates no known allergies.    Social History:  The patient  reports that he has never smoked. He has never used smokeless tobacco. He reports that he drinks about 4.8 oz of alcohol per week. He reports  that he does not use illicit drugs.   Family History:  The patient's family history includes Diabetes in his brother, father, and mother; Heart attack in his father; Heart disease in his mother; Heart failure in his sister; Hypertension in his father. There is no history of Cancer or Stroke.    ROS:  Please see the history of present illness.   Otherwise, review of systems are positive for none.   All other systems are reviewed and negative.    PHYSICAL EXAM: VS:  BP 118/68 mmHg  Pulse 47  Ht 6\' 1"  (1.854 m)  Wt 244 lb 1.9 oz (110.732 kg)  BMI 32.21 kg/m2 , BMI Body mass index is 32.21 kg/(m^2). GEN: Well nourished, well developed, in no acute distress HEENT: normal Neck: no JVD, carotid bruits, or masses Cardiac: RRR; no murmurs, rubs, or gallops,no edema  Respiratory:  clear to auscultation bilaterally, normal work of breathing GI: soft, nontender, nondistended, + BS MS: no deformity or atrophy Skin: warm and dry, no rash Neuro:  Strength and sensation are intact Psych: euthymic mood, full affect   EKG:  EKG is ordered today. The ekg ordered today demonstrates marked sinus bradycardia with a heart rate of 47 bpm and nonspecific T wave changes.   Recent Labs: 12/08/2015: ALT 43; B Natriuretic Peptide 673.0*; TSH 1.506 12/10/2015: Hemoglobin 12.6* 12/12/2015: Magnesium 2.0 01/02/2016: Platelets 177 03/05/2016: BUN 20; Creatinine, Ser 1.05; Potassium 4.3; Sodium 138    Lipid Panel    Component Value Date/Time   CHOL 181 12/10/2015 0157   TRIG 81 12/10/2015 0157   HDL 32* 12/10/2015 0157   CHOLHDL 5.7 12/10/2015 0157   VLDL 16 12/10/2015 0157   LDLCALC 133* 12/10/2015 0157      Wt Readings from Last 3 Encounters:  03/12/16 244 lb 1.9 oz (110.732 kg)  02/27/16 244 lb (110.678 kg)  01/16/16 246 lb 12.8 oz (111.948 kg)       ASSESSMENT AND PLAN:  1.  Chronic systolic heart failure: Severely reduced LV systolic function with an ejection fraction of 20-25% due to  nonischemic cardiomyopathy.  New York Heart Association improved to class II. I clarified his medication and asked him to discontinue metoprolol tartrate and continue with metoprolol succinate. Hopefully his bradycardia will improve. Continue small dose Entresto and spironolactone 25 mg once . I discontinued digoxin today. I requested a repeat echocardiogram to reevaluate his ejection fraction. His ability to go back to work will be determined based on that. 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 If ejection fraction is less than that, he might need to look for a different kind of job.  2. Persistent atrial fibrillation: He continues to be in sinus rhythm. Continue anticoagulation.   3. Moderate mitral regurgitation: Likely due to dilated annulus.  4. Moderate one vessel coronary artery disease: No anginal symptoms. Continue medical therapy.  5. Hyperlipidemia: Most recent LDL was  133. He was sent started on atorvastatin and will require repeat labs in one month.   Disposition:   FU with me in one month after echo  Signed,  Lorine Bears, MD  03/12/2016 1:44 PM    [facility]

## 2016-03-13 ENCOUNTER — Telehealth: Payer: Self-pay | Admitting: Cardiovascular Disease

## 2016-03-13 MED ORDER — FUROSEMIDE 40 MG PO TABS: 40.0000 mg | ORAL_TABLET | Freq: Every day | ORAL | Status: DC

## 2016-03-13 NOTE — Telephone Encounter (Signed)
Pt calling stating he needs [place] to send in a 90 day refill on Lasik for it to be covered  [facility] [address]

## 2016-03-13 NOTE — Telephone Encounter (Signed)
Refill sent for 90 day supply for Lasix to [facility] [address].

## 2016-03-16 ENCOUNTER — Telehealth: Payer: Self-pay | Admitting: Cardiovascular Disease

## 2016-03-16 ENCOUNTER — Other Ambulatory Visit: Payer: Self-pay | Admitting: *Deleted

## 2016-03-16 MED ORDER — FUROSEMIDE 40 MG PO TABS: 40.0000 mg | ORAL_TABLET | Freq: Every day | ORAL | Status: DC

## 2016-03-16 NOTE — Telephone Encounter (Signed)
Pt calling stating his [facility]ik [facility] [address] [facility]'t be refilled it unless it is 90 day.  Please call them in 90 day.

## 2016-03-16 NOTE — Telephone Encounter (Signed)
Requested Prescriptions   Signed Prescriptions Disp Refills  . furosemide (LASIX) 40 MG tablet 90 tablet 3    Sig: Take 1 tablet (40 mg total) by mouth daily.    Authorizing Provider: Lorine Bears A    Ordering User: Kendrick Fries

## 2016-03-16 NOTE — Telephone Encounter (Signed)
Requested Prescriptions   Signed Prescriptions Disp Refills  . furosemide (LASIX) 40 MG tablet 90 tablet 3    Sig: Take 1 tablet (40 mg total) by mouth daily.    Authorizing Provider: ARIDA, MUHAMMAD A    Ordering User: LOPEZ, MARINA C    

## 2016-03-26 NOTE — Telephone Encounter (Signed)
[employer] sent via interoffice mail portion of form to be completed and signed by Dr. Kirke Corin.  This form was sent 03/17/16 and arrived in [place] on 03/26/16 .  Form to be completed involves limits and restrictions including md signature.   Placed in nurse box.

## 2016-03-27 ENCOUNTER — Telehealth: Payer: Self-pay | Admitting: Cardiovascular Disease

## 2016-03-27 NOTE — Telephone Encounter (Signed)
[employer] paperwork placed on Walter Banks's desk

## 2016-03-31 ENCOUNTER — Other Ambulatory Visit: Payer: Self-pay

## 2016-04-03 ENCOUNTER — Ambulatory Visit (INDEPENDENT_AMBULATORY_CARE_PROVIDER_SITE_OTHER): Payer: BLUE CROSS/BLUE SHIELD

## 2016-04-03 ENCOUNTER — Other Ambulatory Visit: Payer: Self-pay

## 2016-04-03 DIAGNOSIS — I5022 Chronic systolic (congestive) heart failure: Secondary | ICD-10-CM

## 2016-04-04 LAB — ECHOCARDIOGRAM COMPLETE
AOPV: 0.62 m/s
AV Area mean vel: 2.26 cm2
AV VEL mean LVOT/AV: 0.59
AV area mean vel ind: 0.93 cm2/m2
AV peak Index: 0.98
AVA: 2.27 cm2
AVAREAVTI: 2.36 cm2
AVAREAVTIIND: 0.94 cm2/m2
AVG: 6 mmHg
AVPG: 11 mmHg
AVPKVEL: 166 cm/s
Ao-asc: 35 cm
CHL CUP AV VEL: 2.27
CHL CUP MV DEC (S): 232
DOP CAL AO MEAN VELOCITY: 111 cm/s
E/e' ratio: 5.57
EWDT: 232 ms
FS: 29 % (ref 28–44)
IVS/LV PW RATIO, ED: 0.92
LA ID, A-P, ES: 39 mm
LA diam end sys: 39 mm
LA diam index: 1.61 cm/m2
LA vol: 94 mL
LAVOLA4C: 102 mL
LAVOLIN: 38.9 mL/m2
LV PW d: 11.5 mm — AB (ref 0.6–1.1)
LV TDI E'LATERAL: 13.5
LV dias vol: 125 mL (ref 62–150)
LV sys vol: 67 mL — AB (ref 21–61)
LVDIAVOLIN: 52 mL/m2
LVEEAVG: 5.57
LVEEMED: 5.57
LVELAT: 13.5 cm/s
LVOT VTI: 22.7 cm
LVOT area: 3.8 cm2
LVOT peak VTI: 0.6 cm
LVOT peak vel: 103 cm/s
LVOTD: 22 mm
LVOTSV: 86 mL
LVSYSVOLIN: 28 mL/m2
MV pk A vel: 86.3 m/s
MV pk E vel: 75.2 m/s
MVPG: 2 mmHg
RV TAPSE: 21.9 mm
Simpson's disk: 46
Stroke v: 58 ml
TDI e' medial: 10
VTI: 38 cm
Valve area index: 0.94

## 2016-04-13 ENCOUNTER — Encounter: Payer: Self-pay | Admitting: Cardiovascular Disease

## 2016-04-13 ENCOUNTER — Ambulatory Visit (INDEPENDENT_AMBULATORY_CARE_PROVIDER_SITE_OTHER): Payer: Self-pay | Admitting: Cardiovascular Disease

## 2016-04-13 VITALS — BP 130/82 | HR 62 | Ht 73.0 in | Wt 248.2 lb

## 2016-04-13 DIAGNOSIS — E785 Hyperlipidemia, unspecified: Secondary | ICD-10-CM

## 2016-04-13 NOTE — Patient Instructions (Signed)
Medication Instructions:  Your physician has recommended you make the following change in your medication:  STOP taking lasix   Labwork: Liver and lipid profile today  Testing/Procedures: none  Follow-Up: Your physician wants you to follow-up in: 3 months with Dr. Kirke Corin.  You will receive a reminder letter in the mail two months in advance. If you don't receive a letter, please call our office to schedule the follow-up appointment.   Any Other Special Instructions Will Be Listed Below (If Applicable).     If you need a refill on your cardiac medications before your next appointment, please call your pharmacy.

## 2016-04-13 NOTE — Progress Notes (Signed)
Cardiology Office Note   Date:  04/13/2016   ID:  Walter Banks, DOB August 09, 1961, MRN [id number]  PCP:  Nicki Reaper, NP  Cardiologist:   Lorine Bears, MD   Chief Complaint  Patient presents with  . other    Follow up from Echo. Meds reviewed by the patient verbally. Pt. c/o chest pain with over exertion and some dizziness.  Pt. 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        History of Present Illness: Walter Banks is a 55 y.o. male who presents for a follow-up visit regarding chronic systolic heart failure due to nonischemic cardiomyopathy, persistent atrial fibrillation status post cardioversion and moderate nonobstructive one-vessel coronary artery disease.  The patient was hospitalized at [facility] in March with congestive heart failure and atrial fibrillation.  Echo showed EF 20-25%, diffuse HK, moderate MR, left atrium severely dilated at 51 mm, RV mildly dilated with mildly reduced systolic function, RA severely dilated, PASP 50 mm Hg. Right and left cardiac cath on 3/7 showed moderate proximal LAD disease with 40% stenosis otherwise no significant stenosis. Right heart cath showed severely elevated right heart pressures including wedge pressure.  He underwent successful cardioversion to sinus rhythm in April. He underwent repeat echocardiogram recently which showed improvement in LV systolic function with an ejection fraction of 50-55% with no evidence of pulmonary hypertension. He has been doing extremely well with significant improvement in dyspnea and no palpitations. He had one episode of chest pain with over exertion but it was very mild and resolved without intervention.    Past Medical History  Diagnosis Date  . Pneumonia   . H. pylori infection   . Persistent atrial fibrillation (HCC) 12/2015    a. on Eliquis; CHADS2VASc = 1 (CHF)  . Chronic systolic CHF (congestive heart failure) (HCC)  12/2015    a. 12/08/2015: echo showing EF of 20-25% w/ diffuse hypokinesis, severely dilated LA and RA, moderate MR and TR, PA peak pressure of 50 mmHg  . NICM (nonischemic cardiomyopathy) (HCC)     a. cardiac cath 12/10/15: pLAD 40%, o/w no stenosis, severely elevated right heart pressures   . VT (ventricular tachycardia) (HCC)     a. 38 beat run during admission 12/2015; b. associated with flushing   . Mitral regurgitation     a. echo as above    Past Surgical History  Procedure Laterality Date  . Wrist surgery Left 2013  . Cardiac catheterization N/A 12/10/2015    Procedure: Right and Left Heart Cath;  Surgeon: Antonieta Iba, MD;  Location: [facility] INVASIVE CV LAB;  Service: Cardiovascular;  Laterality: N/A;  . Electrophysiologic study N/A 01/10/2016    Procedure: CARDIOVERSION;  Surgeon: Iran Ouch, MD;  Location: [facility] ORS;  Service: Cardiovascular;  Laterality: N/A;     Current Outpatient Prescriptions  Medication Sig Dispense Refill  . apixaban (ELIQUIS) 5 MG TABS tablet Take 1 tablet (5 mg total) by mouth 2 (two) times daily. 60 tablet 3  . atorvastatin (LIPITOR) 20 MG tablet Take 1 tablet (20 mg total) by mouth daily. 30 tablet 3  . furosemide (LASIX) 40 MG tablet Take 1 tablet (40 mg total) by mouth daily. 90 tablet 3  . metoprolol succinate (TOPROL XL) 50 MG 24 hr tablet Take 1 tablet (50 mg total) by mouth daily. Take with or immediately following a meal. 30 tablet 3  . multivitamin (ONE-A-DAY  MEN'S) TABS tablet Take 1 tablet by mouth daily.    . sacubitril-valsartan (ENTRESTO) 24-26 MG Take 1 tablet by mouth 2 (two) times daily. 60 tablet 6  . spironolactone (ALDACTONE) 25 MG tablet Take 1 tablet (25 mg total) by mouth daily. 30 tablet 3   No current facility-administered medications for this visit.    Allergies:   Review of patient's allergies indicates no known allergies.    Social History:  The patient  reports that he has never smoked. He has never used smokeless  tobacco. He reports that he drinks about 4.8 oz of alcohol per week. He reports that he does not use illicit drugs.   Family History:  The patient's family history includes Diabetes in his brother, father, and mother; Heart attack in his father; Heart disease in his mother; Heart failure in his sister; Hypertension in his father. There is no history of Cancer or Stroke.    ROS:  Please see the history of present illness.   Otherwise, review of systems are positive for none.   All other systems are reviewed and negative.    PHYSICAL EXAM: VS:  BP 130/82 mmHg  Pulse 62  Ht 6\' 1"  (1.854 m)  Wt 248 lb 4 oz (112.605 kg)  BMI 32.76 kg/m2 , BMI Body mass index is 32.76 kg/(m^2). GEN: Well nourished, well developed, in no acute distress HEENT: normal Neck: no JVD, carotid bruits, or masses Cardiac: RRR; no murmurs, rubs, or gallops,no edema  Respiratory:  clear to auscultation bilaterally, normal work of breathing GI: soft, nontender, nondistended, + BS MS: no deformity or atrophy Skin: warm and dry, no rash Neuro:  Strength and sensation are intact Psych: euthymic mood, full affect   EKG:  EKG is ordered today. The ekg ordered today demonstrates sinus bradycardia with no significant ST or T wave changes.  Recent Labs: 12/08/2015: ALT 43; B Natriuretic Peptide 673.0*; TSH 1.506 12/10/2015: Hemoglobin 12.6* 12/12/2015: Magnesium 2.0 01/02/2016: Platelets 177 03/05/2016: BUN 20; Creatinine, Ser 1.05; Potassium 4.3; Sodium 138    Lipid Panel    Component Value Date/Time   CHOL 181 12/10/2015 0157   TRIG 81 12/10/2015 0157   HDL 32* 12/10/2015 0157   CHOLHDL 5.7 12/10/2015 0157   VLDL 16 12/10/2015 0157   LDLCALC 133* 12/10/2015 0157      Wt Readings from Last 3 Encounters:  04/13/16 248 lb 4 oz (112.605 kg)  03/12/16 244 lb 1.9 oz (110.732 kg)  02/27/16 244 lb (110.678 kg)       ASSESSMENT AND PLAN:  1.  Chronic systolic heart failure: Due to nonischemic cardiomyopathy most  likely tachycardia-induced. His ejection fraction improved to the low normal range after maintaining sinus rhythm and with medical therapy. He is currently New [place] Heart Association class II. There is no evidence of volume overload and I elected to discontinue furosemide. He is otherwise on optimal medical therapy. Given the improvement in his symptoms and ejection fraction to almost normal range, the patient can resume work without limitations from a cardiac standpoint. He works as a Naval architect.   2. Persistent atrial fibrillation: He continues to be in sinus rhythm. Continue anticoagulation.   3. Moderate mitral regurgitation: This was due to dilated annulus and improved to normal on subsequent echo  4. Moderate one vessel coronary artery disease: No anginal symptoms. Continue medical therapy.  5. Hyperlipidemia: Continue treatment with atorvastatin. I requested a follow-up lipid and liver profile.   Disposition:   FU with me in  3 months.   Signed,  Lorine Bears, MD  04/13/2016 2:04 PM    [facility]

## 2016-04-14 LAB — HEPATIC FUNCTION PANEL
ALK PHOS: 74 IU/L (ref 39–117)
ALT: 32 IU/L (ref 0–44)
AST: 29 IU/L (ref 0–40)
Albumin: 4.9 g/dL (ref 3.5–5.5)
Bilirubin Total: 0.4 mg/dL (ref 0.0–1.2)
Bilirubin, Direct: 0.1 mg/dL (ref 0.00–0.40)
TOTAL PROTEIN: 7.6 g/dL (ref 6.0–8.5)

## 2016-04-14 LAB — LIPID PANEL
CHOLESTEROL TOTAL: 223 mg/dL — AB (ref 100–199)
Chol/HDL Ratio: 5.4 ratio units — ABNORMAL HIGH (ref 0.0–5.0)
HDL: 41 mg/dL (ref >39)
LDL Calculated: 126 mg/dL — ABNORMAL HIGH (ref 0–99)
TRIGLYCERIDES: 282 mg/dL — AB (ref 0–149)
VLDL Cholesterol Cal: 56 mg/dL — ABNORMAL HIGH (ref 5–40)

## 2016-04-16 ENCOUNTER — Telehealth: Payer: Self-pay | Admitting: Cardiovascular Disease

## 2016-04-16 NOTE — Telephone Encounter (Signed)
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 Faxed echo, ekg, labs, and OV notes to [phone]. Per Eula Listen, PA-C, pt does not need stress test. Notified pt who verbalized understanding and is appreciative of the call S/w Bjorn Loser at [place], [phone] who confirmed receipt of requested info.

## 2016-04-17 ENCOUNTER — Encounter: Payer: Self-pay | Admitting: Cardiovascular Disease

## 2016-04-17 NOTE — Patient Instructions (Signed)
Heart-Healthy Eating Plan °Heart-healthy meal planning includes: °· Limiting unhealthy fats. °· Increasing healthy fats. °· Making other small dietary changes. °You may need to talk with your doctor or a diet specialist (dietitian) to create an eating plan that is right for you. °WHAT TYPES OF FAT SHOULD I CHOOSE? °· Choose healthy fats. These include olive oil and canola oil, flaxseeds, walnuts, almonds, and seeds. °· Eat more omega-3 fats. These include salmon, mackerel, sardines, tuna, flaxseed oil, and ground flaxseeds. Try to eat fish at least twice each week. °· Limit saturated fats. °¨ Saturated fats are often found in animal products, such as meats, butter, and cream. °¨ Plant sources of saturated fats include palm oil, palm kernel oil, and coconut oil. °· Avoid foods with partially hydrogenated oils in them. These include stick margarine, some tub margarines, cookies, crackers, and other baked goods. These contain trans fats. °WHAT GENERAL GUIDELINES DO I NEED TO FOLLOW? °· Check food labels carefully. Identify foods with trans fats or high amounts of saturated fat. °· Fill one half of your plate with vegetables and green salads. Eat 4-5 servings of vegetables per day. A serving of vegetables is: °¨ 1 cup of raw leafy vegetables. °¨ ½ cup of raw or cooked cut-up vegetables. °¨ ½ cup of vegetable juice. °· Fill one fourth of your plate with whole grains. Look for the word "whole" as the first word in the ingredient list. °· Fill one fourth of your plate with lean protein foods. °· Eat 4-5 servings of fruit per day. A serving of fruit is: °¨ One medium whole fruit. °¨ ¼ cup of dried fruit. °¨ ½ cup of fresh, frozen, or canned fruit. °¨ ½ cup of 100% fruit juice. °· Eat more foods that contain soluble fiber. These include apples, broccoli, carrots, beans, peas, and barley. Try to get 20-30 g of fiber per day. °· Eat more home-cooked food. Eat less restaurant, buffet, and fast food. °· Limit or avoid  alcohol. °· Limit foods high in starch and sugar. °· Avoid fried foods. °· Avoid frying your food. Try baking, boiling, grilling, or broiling it instead. You can also reduce fat by: °¨ Removing the skin from poultry. °¨ Removing all visible fats from meats. °¨ Skimming the fat off of stews, soups, and gravies before serving them. °¨ Steaming vegetables in water or broth. °· Lose weight if you are overweight. °· Eat 4-5 servings of nuts, legumes, and seeds per week: °¨ One serving of dried beans or legumes equals ½ cup after being cooked. °¨ One serving of nuts equals 1½ ounces. °¨ One serving of seeds equals ½ ounce or one tablespoon. °· You may need to keep track of how much salt or sodium you eat. This is especially true if you have high blood pressure. Talk with your doctor or dietitian to get more information. °WHAT FOODS CAN I EAT? °Grains °Breads, including French, white, pita, wheat, raisin, rye, oatmeal, and Italian. Tortillas that are neither fried nor made with lard or trans fat. Low-fat rolls, including hotdog and hamburger buns and English muffins. Biscuits. Muffins. Waffles. Pancakes. Light popcorn. Whole-grain cereals. Flatbread. Melba toast. Pretzels. Breadsticks. Rusks. Low-fat snacks. Low-fat crackers, including oyster, saltine, matzo, graham, animal, and rye. Rice and pasta, including Radel rice and pastas that are made with whole wheat.  °Vegetables °All vegetables.  °Fruits °All fruits, but limit coconut. °Meats and Other Protein Sources °Lean, well-trimmed beef, veal, pork, and lamb. Chicken and turkey without skin. All fish and   shellfish. Wild duck, rabbit, pheasant, and venison. Egg whites or low-cholesterol egg substitutes. Dried beans, peas, lentils, and tofu. Seeds and most nuts. Dairy Low-fat or nonfat cheeses, including ricotta, string, and mozzarella. Skim or 1% milk that is liquid, powdered, or evaporated. Buttermilk that is made with low-fat milk. Nonfat or low-fat  yogurt. Beverages Mineral water. Diet carbonated beverages. Sweets and Desserts Sherbets and fruit ices. Honey, jam, marmalade, jelly, and syrups. Meringues and gelatins. Pure sugar candy, such as hard candy, jelly beans, gumdrops, mints, marshmallows, and small amounts of dark chocolate. [employer]. Eat all sweets and desserts in moderation. Fats and Oils Nonhydrogenated (trans-free) margarines. Vegetable oils, including soybean, sesame, sunflower, olive, peanut, safflower, corn, canola, and cottonseed. Salad dressings or mayonnaise made with a vegetable oil. Limit added fats and oils that you use for cooking, baking, salads, and as spreads. Other Cocoa powder. Coffee and tea. All seasonings and condiments. The items listed above may not be a complete list of recommended foods or beverages. Contact your dietitian for more options. WHAT FOODS ARE NOT RECOMMENDED? Grains Breads that are made with saturated or trans fats, oils, or whole milk. Croissants. Butter rolls. Cheese breads. Sweet rolls. Donuts. Buttered popcorn. Chow mein noodles. High-fat crackers, such as cheese or butter crackers. Meats and Other Protein Sources Fatty meats, such as hotdogs, short ribs, sausage, spareribs, bacon, rib eye roast or steak, and mutton. High-fat deli meats, such as salami and bologna. Caviar. Domestic duck and goose. Organ meats, such as kidney, liver, sweetbreads, and heart. Dairy Cream, sour cream, cream cheese, and creamed cottage cheese. Whole-milk cheeses, including blue (bleu), [address], [place], [place], [address], [place], [address], cheddar, [place], and [place]. Whole or 2% milk that is liquid, evaporated, or condensed. Whole buttermilk. Cream sauce or high-fat cheese sauce. Yogurt that is made from whole milk. Beverages Regular sodas and juice drinks with added sugar. Sweets and Desserts Frosting. Pudding. Cookies. Cakes other than angel food cake. Candy that has milk chocolate or white  chocolate, hydrogenated fat, butter, coconut, or unknown ingredients. Buttered syrups. Full-fat ice cream or ice cream drinks. Fats and Oils Gravy that has suet, meat fat, or shortening. Cocoa butter, hydrogenated oils, palm oil, coconut oil, palm kernel oil. These can often be found in baked products, candy, fried foods, nondairy creamers, and whipped toppings. Solid fats and shortenings, including bacon fat, salt pork, lard, and butter. Nondairy cream substitutes, such as coffee creamers and sour cream substitutes. Salad dressings that are made of unknown oils, cheese, or sour cream. The items listed above may not be a complete list of foods and beverages to avoid. Contact your dietitian for more information.   This information is not intended to replace advice given to you by your health care provider. Make sure you discuss any questions you have with your health care provider.   Document Released: 03/22/2012 Document Revised: 10/12/2014 Document Reviewed: 03/15/2014 Elsevier Interactive Patient Education 2016 Elsevier Inc. Fat and Cholesterol Restricted Diet High levels of fat and cholesterol in your blood may lead to various health problems, such as diseases of the heart, blood vessels, gallbladder, liver, and pancreas. Fats are concentrated sources of energy that come in various forms. Certain types of fat, including saturated fat, may be harmful in excess. Cholesterol is a substance needed by your body in small amounts. Your body makes all the cholesterol it needs. Excess cholesterol comes from the food you eat. When you have high levels of cholesterol and saturated fat in your blood, health  problems can develop because the excess fat and cholesterol will gather along the walls of your blood vessels, causing them to narrow. Choosing the right foods will help you control your intake of fat and cholesterol. This will help keep the levels of these substances in your blood within normal limits and  reduce your risk of disease. WHAT IS MY PLAN? Your health care provider recommends that you:  Limit your intake of saturated fat Limit the amount of cholesterol in your diet  WHAT TYPES OF FAT SHOULD I CHOOSE?  Choose healthy fats more often. Choose monounsaturated and polyunsaturated fats, such as olive and canola oil, flaxseeds, walnuts, almonds, and seeds.  Eat more omega-3 fats. Good choices include salmon, mackerel, sardines, tuna, flaxseed oil, and ground flaxseeds. Aim to eat fish at least two times a week.  Limit saturated fats. Saturated fats are primarily found in animal products, such as meats, butter, and cream. Plant sources of saturated fats include palm oil, palm kernel oil, and coconut oil.  Avoid foods with partially hydrogenated oils in them. These contain trans fats. Examples of foods that contain trans fats are stick margarine, some tub margarines, cookies, crackers, and other baked goods. WHAT GENERAL GUIDELINES DO I NEED TO FOLLOW? These guidelines for healthy eating will help you control your intake of fat and cholesterol:  Check food labels carefully to identify foods with trans fats or high amounts of saturated fat.  Fill one half of your plate with vegetables and green salads.  Fill one fourth of your plate with whole grains. Look for the word "whole" as the first word in the ingredient list.  Fill one fourth of your plate with lean protein foods.  Limit fruit to two servings a day. Choose fruit instead of juice.  Eat more foods that contain soluble fiber. Examples of foods that contain this type of fiber are apples, broccoli, carrots, beans, peas, and barley. Aim to get 20-30 g of fiber per day.  Eat more home-cooked food and less restaurant, buffet, and fast food.  Limit or avoid alcohol.  Limit foods high in starch and sugar.  Limit fried foods.  Cook foods using methods other than frying. Baking, boiling, grilling, and broiling are all great  options.  Lose weight if you are overweight. Losing just 5-10% of your initial body weight can help your overall health and prevent diseases such as diabetes and heart disease. WHAT FOODS CAN I EAT? Grains Whole grains, such as whole wheat or whole grain breads, crackers, cereals, and pasta. Unsweetened oatmeal, bulgur, barley, quinoa, or brown rice. Corn or whole wheat flour tortillas. Vegetables Fresh or frozen vegetables (raw, steamed, roasted, or grilled). Green salads. Fruits All fresh, canned (in natural juice), or frozen fruits. Meat and Other Protein Products Ground beef (85% or leaner), grass-fed beef, or beef trimmed of fat. Skinless chicken or [place]. Ground chicken or [place]. Pork trimmed of fat. All fish and seafood. Eggs. Dried beans, peas, or lentils. Unsalted nuts or seeds. Unsalted canned or dry beans. Dairy Low-fat dairy products, such as skim or 1% milk, 2% or reduced-fat cheeses, low-fat ricotta or cottage cheese, or plain low-fat yogurt. Fats and Oils Tub margarines without trans fats. Light or reduced-fat mayonnaise and salad dressings. Avocado. Olive, canola, sesame, or safflower oils. Natural peanut or almond butter (choose ones without added sugar and oil). The items listed above may not be a complete list of recommended foods or beverages. Contact your dietitian for more options. WHAT FOODS ARE NOT  RECOMMENDED? Grains White bread. White pasta. White rice. Cornbread. Bagels, pastries, and croissants. Crackers that contain trans fat. Vegetables White potatoes. Corn. Creamed or fried vegetables. Vegetables in a cheese sauce. Fruits Dried fruits. Canned fruit in light or heavy syrup. Fruit juice. Meat and Other Protein Products Fatty cuts of meat. Ribs, chicken wings, bacon, sausage, bologna, salami, chitterlings, fatback, hot dogs, bratwurst, and packaged luncheon meats. Liver and organ meats. Dairy Whole or 2% milk, cream, half-and-half, and cream cheese. Whole milk  cheeses. Whole-fat or sweetened yogurt. Full-fat cheeses. Nondairy creamers and whipped toppings. Processed cheese, cheese spreads, or cheese curds. Sweets and Desserts Corn syrup, sugars, honey, and molasses. Candy. Jam and jelly. Syrup. Sweetened cereals. Cookies, pies, cakes, donuts, muffins, and ice cream. Fats and Oils Butter, stick margarine, lard, shortening, ghee, or bacon fat. Coconut, palm kernel, or palm oils. Beverages Alcohol. Sweetened drinks (such as sodas, lemonade, and fruit drinks or punches). The items listed above may not be a complete list of foods and beverages to avoid. Contact your dietitian for more information.   This information is not intended to replace advice given to you by your health care provider. Make sure you discuss any questions you have with your health care provider.   Document Released: 09/21/2005 Document Revised: 10/12/2014 Document Reviewed: 12/20/2013 Elsevier Interactive Patient Education [employer]

## 2016-04-21 ENCOUNTER — Telehealth: Payer: Self-pay | Admitting: Cardiovascular Disease

## 2016-04-21 NOTE — Telephone Encounter (Signed)
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 Please call.

## 2016-04-22 NOTE — Telephone Encounter (Signed)
Per Eula Listen, PA-C, pt does not need stress test for DOT clearance. Letter written w/supporting documentation and faxed to Karie Schwalbe, PA-C, [facility]. Fax [phone], phone [phone] Notified pt who verbalized understanding and is appreciative of the call.   S/w Beth at [facility] who confirmed receipt of fax but states Karie Schwalbe will not be back in the office until Saturday. No one can sign off on DOT clearance except her.  Pt is to call Urgent Care today to determine next step.

## 2016-04-27 ENCOUNTER — Telehealth: Payer: Self-pay | Admitting: Cardiovascular Disease

## 2016-04-27 DIAGNOSIS — Z0289 Encounter for other administrative examinations: Secondary | ICD-10-CM

## 2016-04-27 NOTE — Telephone Encounter (Signed)
PA With [employer] calling states that she cannot sign off on pt DOT form until pat has a stress test. Please call.

## 2016-04-27 NOTE — Telephone Encounter (Signed)
Can you see if he wants to come in for a regular treadmill?  This Thursday is wide open.

## 2016-04-27 NOTE — Telephone Encounter (Signed)
He had a cardiac cath in March which showed no obstructive CAD that's why a stress test was not ordered. But if they want a stress test to know his functional capacity, I am ok with him having GXT.

## 2016-04-30 ENCOUNTER — Telehealth: Payer: Self-pay | Admitting: Cardiovascular Disease

## 2016-04-30 ENCOUNTER — Ambulatory Visit (INDEPENDENT_AMBULATORY_CARE_PROVIDER_SITE_OTHER): Payer: Self-pay

## 2016-04-30 DIAGNOSIS — Z0289 Encounter for other administrative examinations: Secondary | ICD-10-CM

## 2016-04-30 LAB — EXERCISE TOLERANCE TEST
CSEPEW: 7 METS
CSEPPHR: 144 {beats}/min
Exercise duration (min): 6 min
Exercise duration (sec): 0 s
MPHR: 165 {beats}/min
Percent HR: 87 %
Rest HR: 65 {beats}/min

## 2016-04-30 NOTE — Telephone Encounter (Signed)
Patient in office today for DOT clearance treadmill test.  Checking status of fmla short term forms [employer] faxed to office last week.   I don not see a note and lmov on vm with Annette at [employer] to get details on this fax.  Waiting on return call to discuss.     Drinda Butts with [employer] [phone]  ext [phone]    Fax [phone].

## 2016-04-30 NOTE — Telephone Encounter (Signed)
FMLA forms received from [employer] via fax.   Patient signed new roi form   Forms, consent , and money order $25 fee sent via fax and office mail to [place] at [employer].

## 2016-05-04 ENCOUNTER — Telehealth: Payer: Self-pay | Admitting: Cardiovascular Disease

## 2016-05-04 NOTE — Telephone Encounter (Signed)
S/w Karie Schwalbe, PA-C at [facility], [facility], who states they did not receive MD attestation that pt is cleared to return to work. Refaxed to [phone]

## 2016-05-20 ENCOUNTER — Telehealth: Payer: Self-pay | Admitting: Cardiovascular Disease

## 2016-05-20 NOTE — Telephone Encounter (Signed)
Patient FMLA/Disability Form from [employer] placed in MD basket.

## 2016-05-21 ENCOUNTER — Telehealth: Payer: Self-pay | Admitting: Cardiovascular Disease

## 2016-05-21 NOTE — Telephone Encounter (Signed)
Entresto 24-26 mg  Eliquis 5 mg  Samples placed at front desk for pick up.

## 2016-05-21 NOTE — Telephone Encounter (Signed)
Pt would like Entresto and Eliquis samples. States his insurance doesn't start until 9/1.

## 2016-05-26 NOTE — Telephone Encounter (Signed)
FMLA/Disability form from [employer] given to [place]

## 2016-06-09 ENCOUNTER — Other Ambulatory Visit: Payer: Self-pay

## 2016-06-09 MED ORDER — APIXABAN 5 MG PO TABS: 5.0000 mg | ORAL_TABLET | Freq: Two times a day (BID) | ORAL | 3 refills | Status: DC

## 2016-07-10 ENCOUNTER — Other Ambulatory Visit: Payer: Self-pay | Admitting: *Deleted

## 2016-07-10 MED ORDER — METOPROLOL SUCCINATE ER 50 MG PO TB24: 50.0000 mg | ORAL_TABLET | Freq: Every day | ORAL | 3 refills | Status: DC

## 2016-07-10 MED ORDER — SPIRONOLACTONE 25 MG PO TABS: 25.0000 mg | ORAL_TABLET | Freq: Every day | ORAL | 3 refills | Status: DC

## 2016-07-13 ENCOUNTER — Other Ambulatory Visit: Payer: Self-pay | Admitting: Cardiovascular Disease

## 2016-07-31 ENCOUNTER — Telehealth: Payer: Self-pay | Admitting: Cardiovascular Disease

## 2016-07-31 NOTE — Telephone Encounter (Signed)
Patient has been having some numbness "falling asleep feeling" in his Left leg (thigh area).  He has had a couple of episodes and wants to know if this is something to be concerned about.  Patient scheduled for fu in November with [facility].

## 2016-08-03 NOTE — Telephone Encounter (Signed)
Left message on machine for patient to contact the office.   

## 2016-08-18 ENCOUNTER — Ambulatory Visit (INDEPENDENT_AMBULATORY_CARE_PROVIDER_SITE_OTHER): Payer: BLUE CROSS/BLUE SHIELD | Admitting: Cardiovascular Disease

## 2016-08-18 ENCOUNTER — Encounter: Payer: Self-pay | Admitting: Cardiovascular Disease

## 2016-08-18 VITALS — BP 110/82 | HR 59 | Ht 73.0 in | Wt 257.8 lb

## 2016-08-18 DIAGNOSIS — I481 Persistent atrial fibrillation: Secondary | ICD-10-CM

## 2016-08-18 DIAGNOSIS — I4819 Other persistent atrial fibrillation: Secondary | ICD-10-CM

## 2016-08-18 DIAGNOSIS — I2510 Atherosclerotic heart disease of native coronary artery without angina pectoris: Secondary | ICD-10-CM

## 2016-08-18 DIAGNOSIS — I5022 Chronic systolic (congestive) heart failure: Secondary | ICD-10-CM

## 2016-08-18 NOTE — Progress Notes (Signed)
Cardiology Office Note   Date:  08/18/2016   ID:  Walter Banks, DOB May 20, 1961, MRN [id number]  PCP:  Nicki ReaperBAITY, REGINA, NP  Cardiologist:   Lorine BearsMuhammad Lana Flaim, MD   Chief Complaint  Patient presents with  . other    3 month f/u c/o left thigh numbness. Meds reviewed verbally with pt.      History of Present Illness: Walter Banks is a 55 y.o. male who presents for a follow-up visit regarding chronic systolic heart failure due to nonischemic cardiomyopathy, persistent atrial fibrillation status post cardioversion and moderate nonobstructive one-vessel coronary artery disease.  The patient was hospitalized at [facility] in March with congestive heart failure and atrial fibrillation.  Echo showed EF 20-25%, diffuse HK, moderate MR, left atrium severely dilated at 51 mm, RV mildly dilated with mildly reduced systolic function, RA severely dilated, PASP 50 mm Hg. Right and left cardiac cath on 3/7 showed moderate proximal LAD disease with 40% stenosis otherwise no significant stenosis. Right heart cath showed severely elevated right heart pressures including wedge pressure.  He underwent successful cardioversion to sinus rhythm in April. Repeat echocardiogram showed improvement in LV systolic function with an ejection fraction of 50-55% with no evidence of pulmonary hypertension. He has been doing extremely well and denies any chest pain, shortness of breath or palpitations.    Past Medical History:  Diagnosis Date  . Chronic systolic CHF (congestive heart failure) (HCC) 12/2015   a. 12/08/2015: echo showing EF of 20-25% w/ diffuse hypokinesis, severely dilated LA and RA, moderate MR and TR, PA peak pressure of 50 mmHg  . H. pylori infection   . Mitral regurgitation    a. echo as above  . NICM (nonischemic cardiomyopathy) (HCC)    a. cardiac cath 12/10/15: pLAD 40%, o/w no stenosis, severely elevated right heart pressures   . Persistent atrial fibrillation (HCC) 12/2015   a. on Eliquis;  CHADS2VASc = 1 (CHF)  . Pneumonia   . VT (ventricular tachycardia) (HCC)    a. 38 beat run during admission 12/2015; b. associated with flushing     Past Surgical History:  Procedure Laterality Date  . CARDIAC CATHETERIZATION N/A 12/10/2015   Procedure: Right and Left Heart Cath;  Surgeon: Antonieta Ibaimothy J Gollan, MD;  Location: [facility] INVASIVE CV LAB;  Service: Cardiovascular;  Laterality: N/A;  . ELECTROPHYSIOLOGIC STUDY N/A 01/10/2016   Procedure: CARDIOVERSION;  Surgeon: Iran OuchMuhammad A Devery Murgia, MD;  Location: [facility] ORS;  Service: Cardiovascular;  Laterality: N/A;  . WRIST SURGERY Left 2013     Current Outpatient Prescriptions  Medication Sig Dispense Refill  . apixaban (ELIQUIS) 5 MG TABS tablet Take 1 tablet (5 mg total) by mouth 2 (two) times daily. 60 tablet 3  . atorvastatin (LIPITOR) 20 MG tablet TAKE 1 TABLET (20 MG TOTAL) BY MOUTH DAILY. 30 tablet 3  . metoprolol succinate (TOPROL XL) 50 MG 24 hr tablet Take 1 tablet (50 mg total) by mouth daily. Take with or immediately following a meal. 90 tablet 3  . multivitamin (ONE-A-DAY MEN'S) TABS tablet Take 1 tablet by mouth daily.    . sacubitril-valsartan (ENTRESTO) 24-26 MG Take 1 tablet by mouth 2 (two) times daily. 60 tablet 6  . spironolactone (ALDACTONE) 25 MG tablet Take 1 tablet (25 mg total) by mouth daily. 90 tablet 3   No current facility-administered medications for this visit.     Allergies:   Patient has no known allergies.    Social History:  The patient  reports that  he has never smoked. He has never used smokeless tobacco. He reports that he drinks about 4.8 oz of alcohol per week . He reports that he does not use drugs.   Family History:  The patient's family history includes Diabetes in his brother, father, and mother; Heart attack in his father; Heart disease in his mother; Heart failure in his sister; Hypertension in his father.    ROS:  Please see the history of present illness.   Otherwise, review of systems are positive  for none.   All other systems are reviewed and negative.    PHYSICAL EXAM: VS:  BP 110/82 (BP Location: Left Arm, Patient Position: Sitting, Cuff Size: Normal)   Pulse (!) 59   Ht 6\' 1"  (1.854 m)   Wt 257 lb 12 oz (116.9 kg)   BMI 34.01 kg/m  , BMI Body mass index is 34.01 kg/m. GEN: Well nourished, well developed, in no acute distress HEENT: normal Neck: no JVD, carotid bruits, or masses Cardiac: RRR; no murmurs, rubs, or gallops,no edema  Respiratory:  clear to auscultation bilaterally, normal work of breathing GI: soft, nontender, nondistended, + BS MS: no deformity or atrophy Skin: warm and dry, no rash Neuro:  Strength and sensation are intact Psych: euthymic mood, full affect   EKG:  EKG is ordered today. The ekg ordered today demonstrates sinus bradycardia with no significant ST or T wave changes.  Recent Labs: 12/08/2015: B Natriuretic Peptide 673.0; TSH 1.506 12/10/2015: Hemoglobin 12.6 12/12/2015: Magnesium 2.0 01/02/2016: Platelets 177 03/05/2016: BUN 20; Creatinine, Ser 1.05; Potassium 4.3; Sodium 138 04/13/2016: ALT 32    Lipid Panel    Component Value Date/Time   CHOL 223 (H) 04/13/2016 1420   TRIG 282 (H) 04/13/2016 1420   HDL 41 04/13/2016 1420   CHOLHDL 5.4 (H) 04/13/2016 1420   CHOLHDL 5.7 12/10/2015 0157   VLDL 16 12/10/2015 0157   LDLCALC 126 (H) 04/13/2016 1420      Wt Readings from Last 3 Encounters:  08/18/16 257 lb 12 oz (116.9 kg)  04/13/16 248 lb 4 oz (112.6 kg)  03/12/16 244 lb 1.9 oz (110.7 kg)       ASSESSMENT AND PLAN:  1.  Chronic systolic heart failure: Due to nonischemic cardiomyopathy most likely tachycardia-induced. His ejection fraction improved to the low normal range after maintaining sinus rhythm and with medical therapy. He is currently New York Heart Association class I. He is on optimal medical therapy.  2. Persistent atrial fibrillation: He continues to be in sinus rhythm. Continue anticoagulation.  3. Moderate one vessel  coronary artery disease: No anginal symptoms. Continue medical therapy.  4. Hyperlipidemia: Continue treatment with atorvastatin. Most recent lipid profile was still not optimal. I discussed with him the importance of healthy diet.  5. Obesity: His weight continues to go up. He consumes large amount of bread. I asked him to decrease her carbohydrate intake.   Disposition:   FU with me in 6 months.   Signed,  Lorine Bears, MD  08/18/2016 3:46 PM    [facility]

## 2016-08-18 NOTE — Patient Instructions (Signed)
Medication Instructions: Continue same medications.   Labwork: None.   Procedures/Testing: None.   Follow-Up: 6 months with Dr. Ignacio Lowder.   Any Additional Special Instructions Will Be Listed Below (If Applicable).     If you need a refill on your cardiac medications before your next appointment, please call your pharmacy.   

## 2016-09-02 ENCOUNTER — Other Ambulatory Visit: Payer: Self-pay

## 2016-09-02 MED ORDER — SACUBITRIL-VALSARTAN 24-26 MG PO TABS: 1.0000 | ORAL_TABLET | Freq: Two times a day (BID) | ORAL | 3 refills | Status: DC

## 2016-09-03 ENCOUNTER — Telehealth: Payer: Self-pay | Admitting: Cardiovascular Disease

## 2016-09-03 ENCOUNTER — Other Ambulatory Visit: Payer: Self-pay | Admitting: *Deleted

## 2016-09-03 MED ORDER — SACUBITRIL-VALSARTAN 24-26 MG PO TABS: 1.0000 | ORAL_TABLET | Freq: Two times a day (BID) | ORAL | 3 refills | Status: DC

## 2016-09-03 NOTE — Telephone Encounter (Signed)
°*  STAT* If patient is at the pharmacy, call can be transferred to refill team.   1. Which medications need to be refilled? (please list name of each medication and dose if known)  entresto  2. Which pharmacy/location (including street and city if local pharmacy) is medication to be sent to?  [facility] on [address]  3. Do they need a 30 day or 90 day supply? Insurance company won't pay unless it a 90 day

## 2016-09-03 NOTE — Telephone Encounter (Signed)
Requested Prescriptions   Signed Prescriptions Disp Refills  . sacubitril-valsartan (ENTRESTO) 24-26 MG 180 tablet 3    Sig: Take 1 tablet by mouth 2 (two) times daily.    Authorizing Provider: Lorine Bears A    Ordering User: Kendrick Fries

## 2016-09-03 NOTE — Telephone Encounter (Signed)
Requested Prescriptions   Signed Prescriptions Disp Refills  . sacubitril-valsartan (ENTRESTO) 24-26 MG 180 tablet 3    Sig: Take 1 tablet by mouth 2 (two) times daily.    Authorizing Provider: ARIDA, MUHAMMAD A    Ordering User: LOPEZ, MARINA C    

## 2016-09-26 ENCOUNTER — Other Ambulatory Visit: Payer: Self-pay | Admitting: Cardiovascular Disease

## 2016-10-31 ENCOUNTER — Other Ambulatory Visit: Payer: Self-pay | Admitting: Cardiovascular Disease

## 2016-12-14 ENCOUNTER — Telehealth: Payer: Self-pay | Admitting: Cardiovascular Disease

## 2016-12-14 NOTE — Telephone Encounter (Signed)
Patient had a wellness check at work and all numbers looked good except triglycerides was 316 .  Please call to discuss.

## 2016-12-14 NOTE — Telephone Encounter (Signed)
Pt called back and reported triglycerides 316, LDL 32 at recent work wellness check.  He takes atorvastatin 20mg  and now has someone cooking healthy meals for him. He has not had pizza or a burger in 2 months; reports 25 lb weight loss and is feeling better. He has also been trying to exercise. Educated pt regarding foods to avoid/ways to decrease triglycerides.  Pt verbalized understanding and will fax lab results to [place].

## 2016-12-14 NOTE — Telephone Encounter (Signed)
Left message on machine for patient to contact the office.   

## 2016-12-22 ENCOUNTER — Other Ambulatory Visit: Payer: Self-pay

## 2016-12-22 MED ORDER — SACUBITRIL-VALSARTAN 24-26 MG PO TABS: 1.0000 | ORAL_TABLET | Freq: Two times a day (BID) | ORAL | 3 refills | Status: DC

## 2016-12-22 NOTE — Telephone Encounter (Signed)
90 day supply

## 2016-12-29 ENCOUNTER — Other Ambulatory Visit: Payer: Self-pay | Admitting: Cardiovascular Disease

## 2016-12-29 MED ORDER — ATORVASTATIN CALCIUM 20 MG PO TABS: 20.0000 mg | ORAL_TABLET | Freq: Every day | ORAL | 0 refills | Status: DC

## 2017-01-17 IMAGING — CR DG CHEST 2V
1 series · 2 of 2 positions shown · non-contrast
Comparison: None.

CLINICAL DATA: Recent diagnosis of pneumonia with improved symptoms

EXAM:
CHEST  2 VIEW

[Series 1: w chest pa · 0.14mm/px · 2 of 2 slices shown]
[im 1/2]
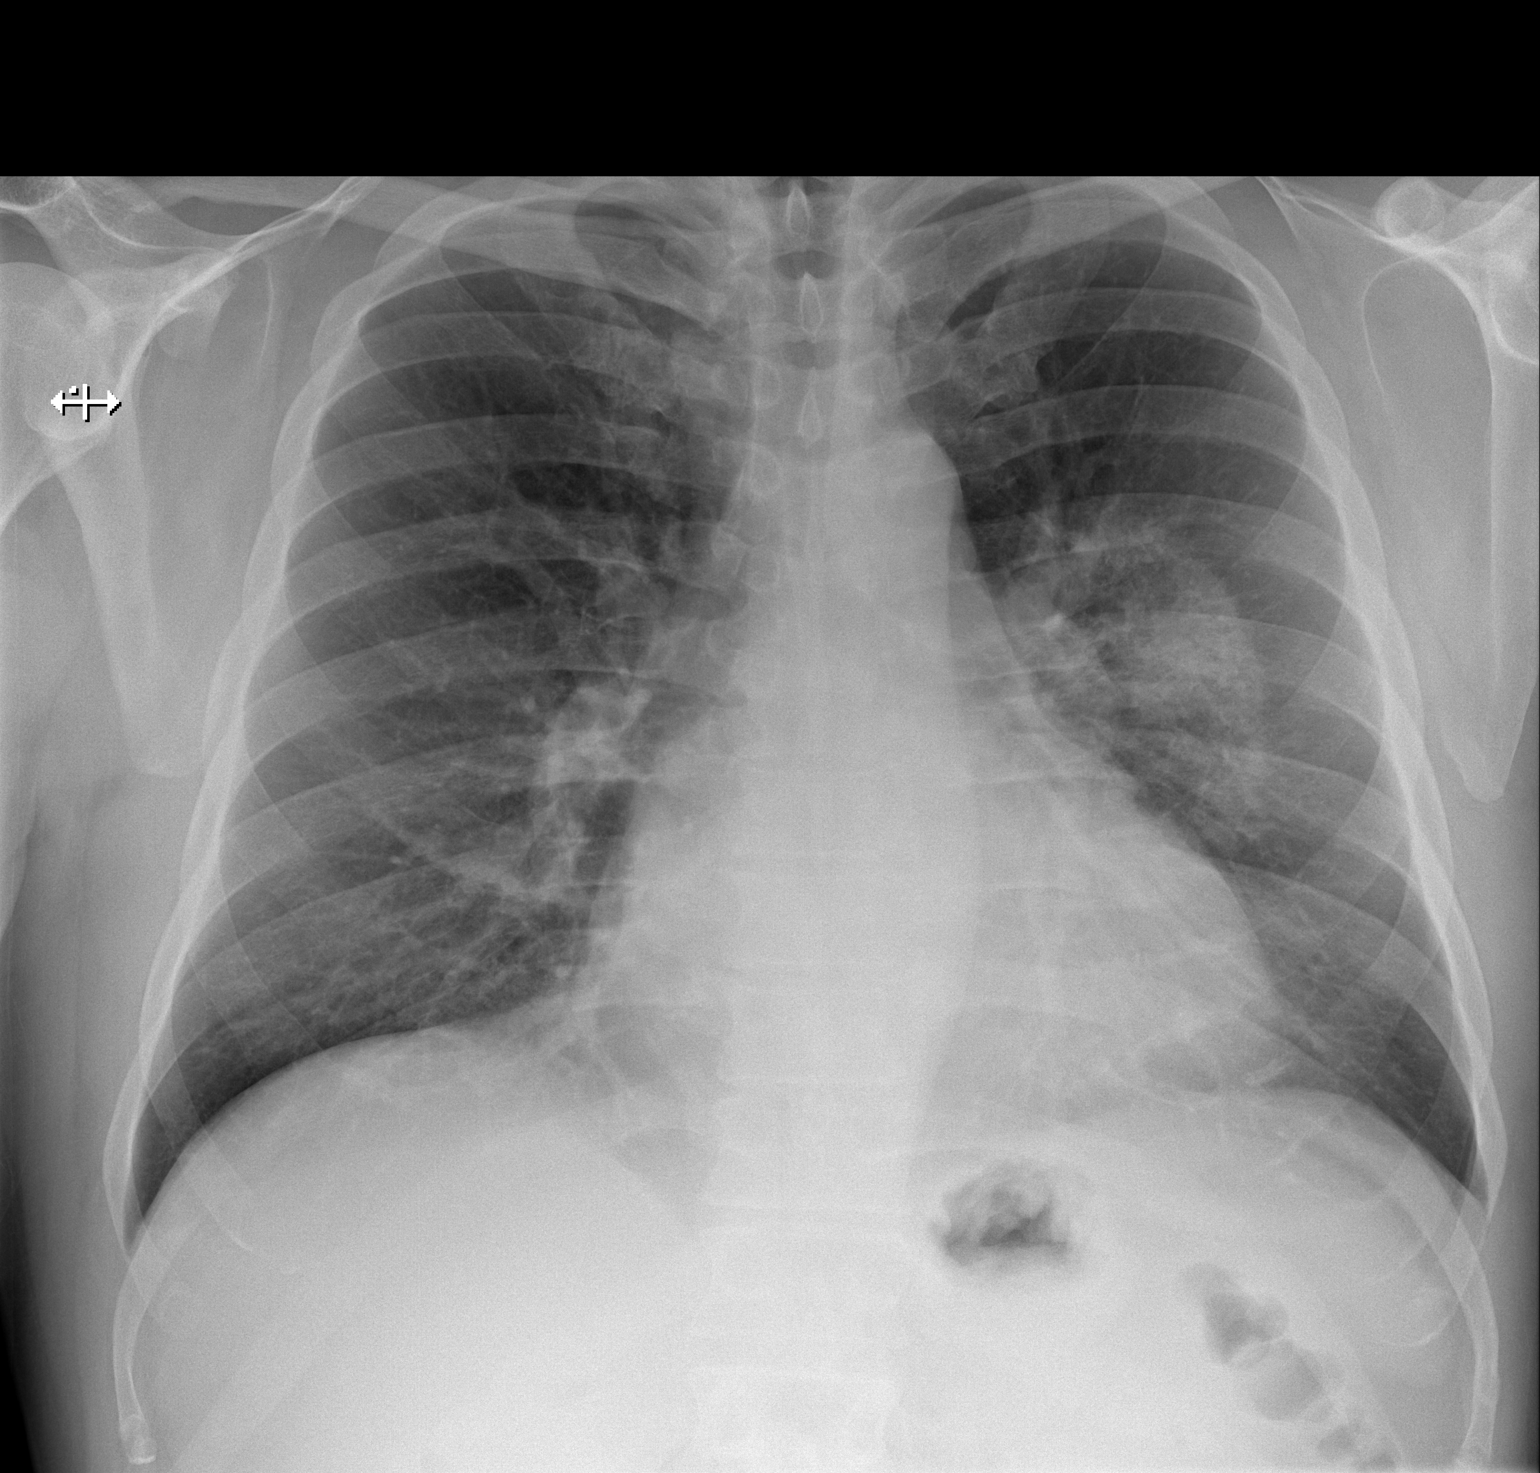
[im 2/2]
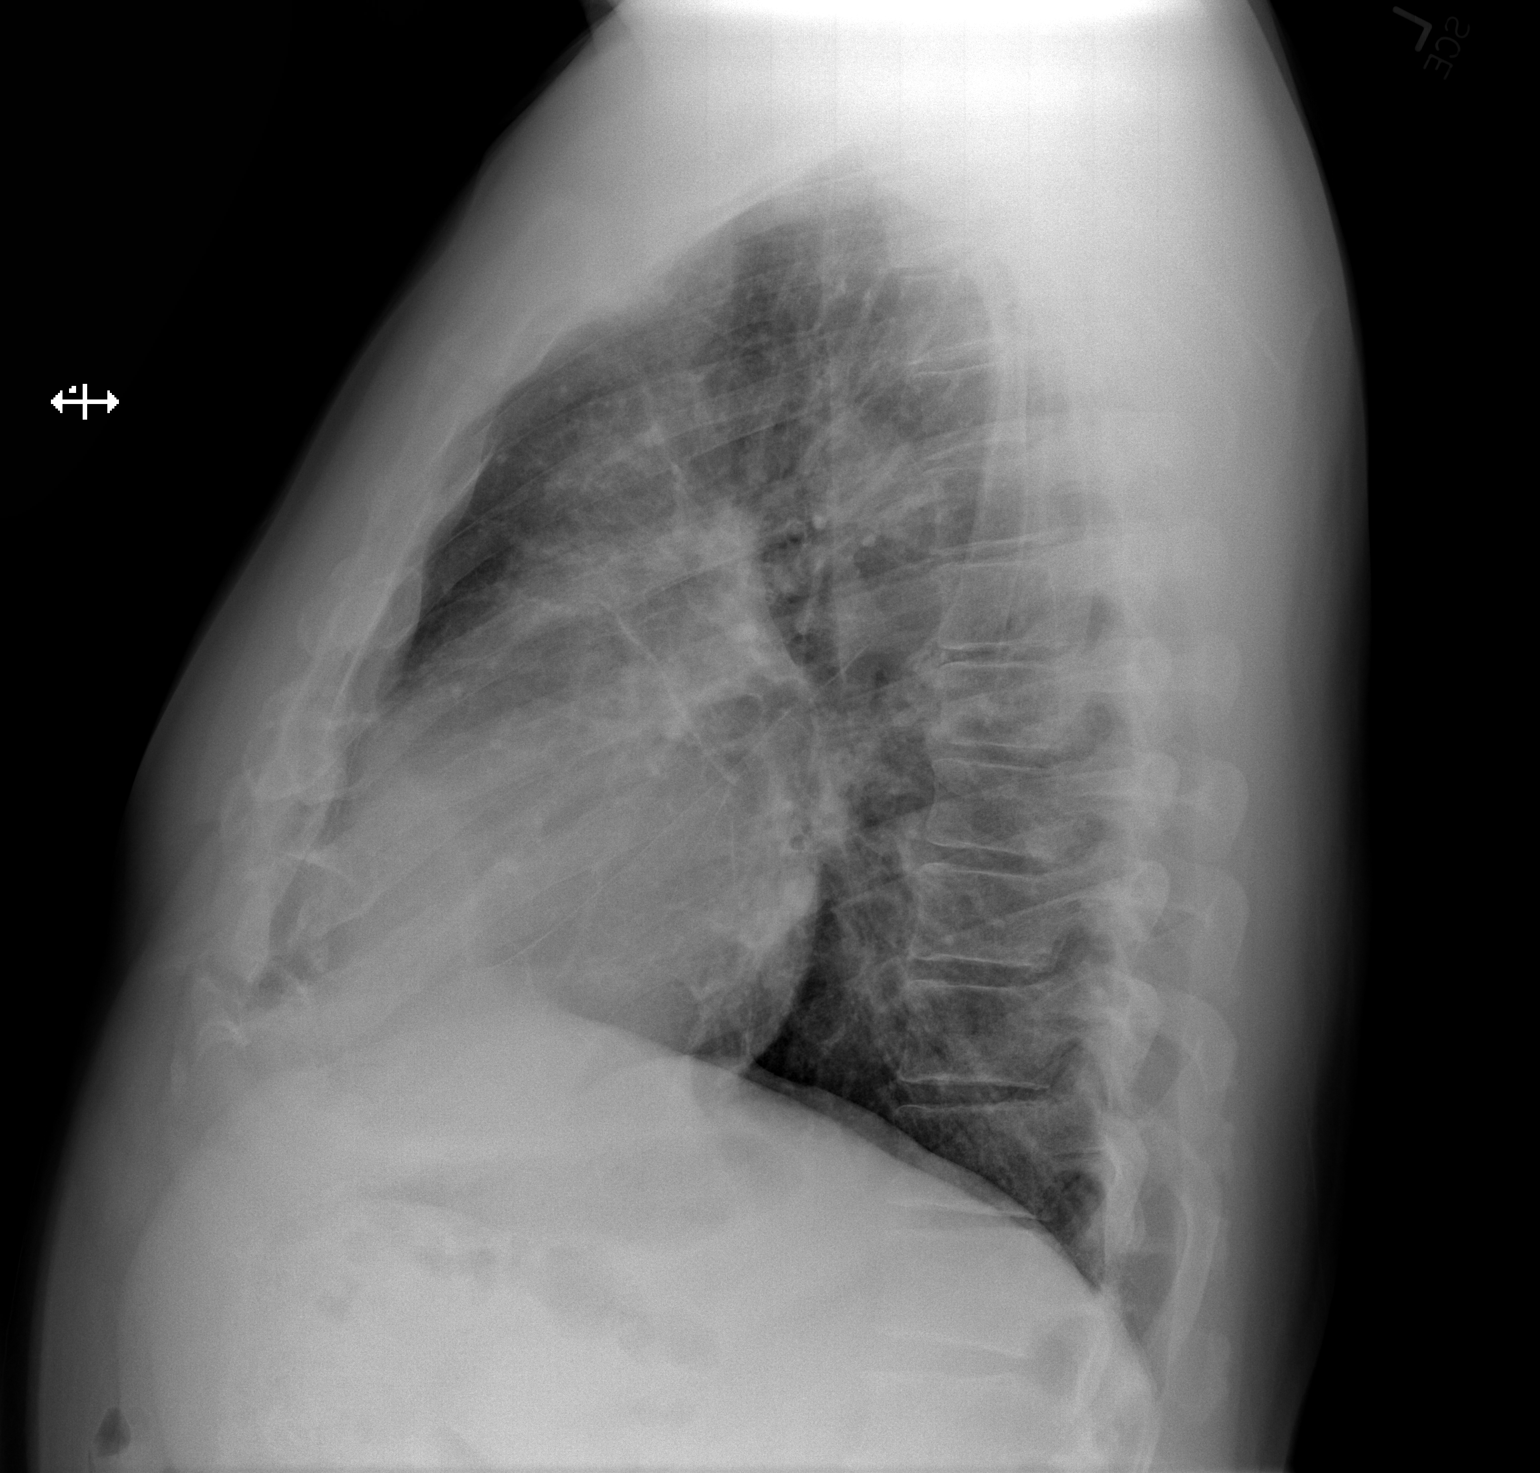

[2 of 2 positions shown; findings below may reference images not displayed]

FINDINGS: Cardiac shadow is mildly enlarged. The lungs are well aerated
bilaterally. No sizable effusion is noted. Curvilinear increased
density is noted in the left mid lung and perihilar region
projecting in the left upper lobe on the lateral projection
IMPRESSION: Persistent infiltrative density in the left upper lobe. Continued
follow-up is recommended.

## 2017-01-19 ENCOUNTER — Telehealth: Payer: Self-pay | Admitting: Cardiovascular Disease

## 2017-01-19 ENCOUNTER — Other Ambulatory Visit: Payer: Self-pay | Admitting: *Deleted

## 2017-01-19 MED ORDER — METOPROLOL SUCCINATE ER 50 MG PO TB24: 50.0000 mg | ORAL_TABLET | Freq: Every day | ORAL | 0 refills | Status: DC

## 2017-01-19 MED ORDER — SPIRONOLACTONE 25 MG PO TABS: 25.0000 mg | ORAL_TABLET | Freq: Every day | ORAL | 0 refills | Status: DC

## 2017-01-19 MED ORDER — ATORVASTATIN CALCIUM 20 MG PO TABS: 20.0000 mg | ORAL_TABLET | Freq: Every day | ORAL | 0 refills | Status: DC

## 2017-01-19 NOTE — Telephone Encounter (Signed)
Requested Prescriptions   Signed Prescriptions Disp Refills  . atorvastatin (LIPITOR) 20 MG tablet 90 tablet 0    Sig: Take 1 tablet (20 mg total) by mouth daily.    Authorizing Provider: ARIDA, MUHAMMAD A    Ordering User: Ariahna Smiddy C  . spironolactone (ALDACTONE) 25 MG tablet 90 tablet 0    Sig: Take 1 tablet (25 mg total) by mouth daily.    Authorizing Provider: ARIDA, MUHAMMAD A    Ordering User: Tamario Heal C  . metoprolol succinate (TOPROL XL) 50 MG 24 hr tablet 90 tablet 0    Sig: Take 1 tablet (50 mg total) by mouth daily. Take with or immediately following a meal.    Authorizing Provider: ARIDA, MUHAMMAD A    Ordering User: Lamisha Roussell C    

## 2017-01-19 NOTE — Telephone Encounter (Signed)
°*  STAT* If patient is at the pharmacy, call can be transferred to refill team.   1. Which medications need to be refilled? (please list name of each medication and dose if known)  Lipitor  Spironolactone  Metoprolol   2. Which pharmacy/location (including street and city if local pharmacy) is medication to be sent to? [facility] [address]   3. Do they need a 30 day or 90 day supply? 90 day

## 2017-01-19 NOTE — Telephone Encounter (Signed)
Requested Prescriptions   Signed Prescriptions Disp Refills  . atorvastatin (LIPITOR) 20 MG tablet 90 tablet 0    Sig: Take 1 tablet (20 mg total) by mouth daily.    Authorizing Provider: Lorine Bears A    Ordering User: Kendrick Fries spironolactone (ALDACTONE) 25 MG tablet 90 tablet 0    Sig: Take 1 tablet (25 mg total) by mouth daily.    Authorizing Provider: Lorine Bears A    Ordering User: Shawnie Dapper, MARINA C  . metoprolol succinate (TOPROL XL) 50 MG 24 hr tablet 90 tablet 0    Sig: Take 1 tablet (50 mg total) by mouth daily. Take with or immediately following a meal.    Authorizing Provider: Lorine Bears A    Ordering User: Kendrick Fries

## 2017-01-29 ENCOUNTER — Other Ambulatory Visit: Payer: Self-pay | Admitting: Cardiovascular Disease

## 2017-01-29 NOTE — Telephone Encounter (Signed)
Pt needs f/u appt with Arida.

## 2017-02-04 IMAGING — DX DG ABDOMEN 1V
1 series · 1 of 1 positions shown · non-contrast
Comparison: None.

CLINICAL DATA: Abdominal distention and bloating for 1 week.

EXAM:
ABDOMEN - 1 VIEW

[abdomen kub]
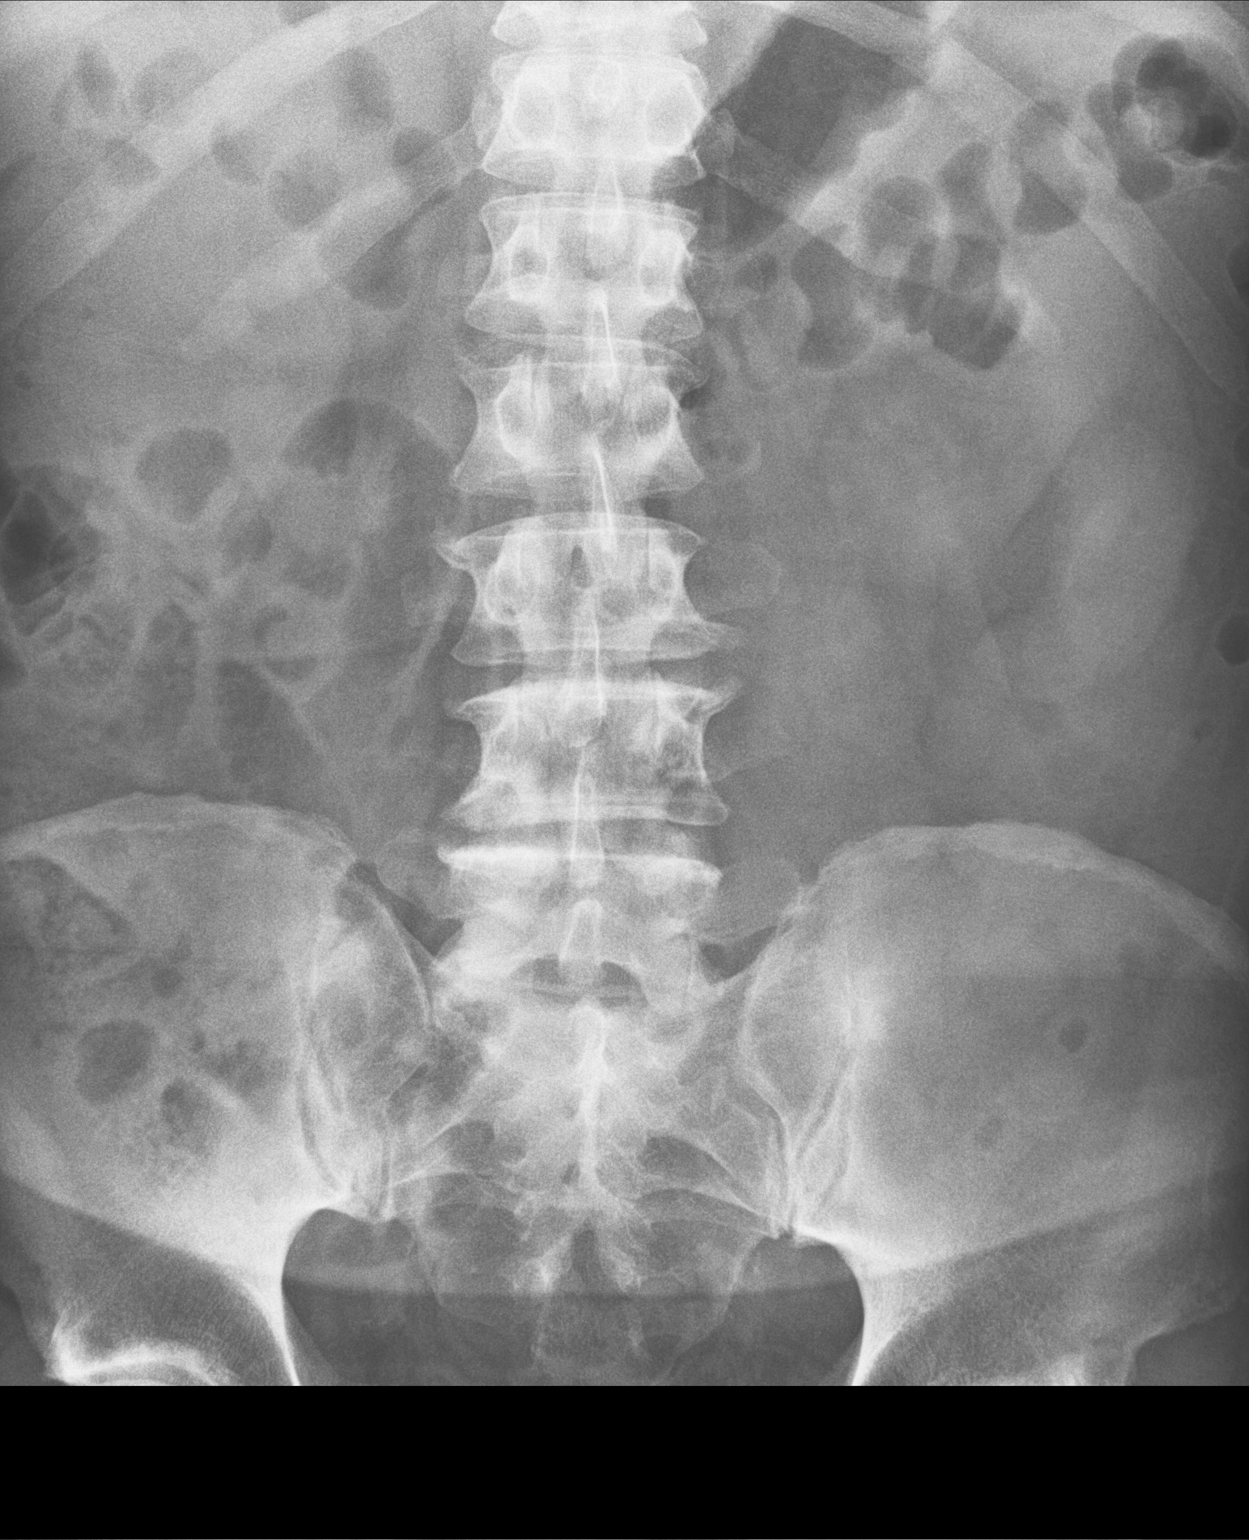

[1 of 1 positions shown; findings below may reference images not displayed]

FINDINGS: Bowel gas pattern is normal. No significant osseous abnormality.
Phleboliths in the right side of the pelvis.
IMPRESSION: Benign appearing abdomen. No distended bowel. No excessive air in
the bowel.

## 2017-02-08 IMAGING — CR DG CHEST 1V PORT
1 series · 1 of 1 positions shown · non-contrast
Comparison: Chest radiograph performed 11/16/2015

CLINICAL DATA: Acute onset of generalized weakness, shortness of
breath and hot flashes. Insomnia. Initial encounter.

EXAM:
PORTABLE CHEST 1 VIEW

[portable]
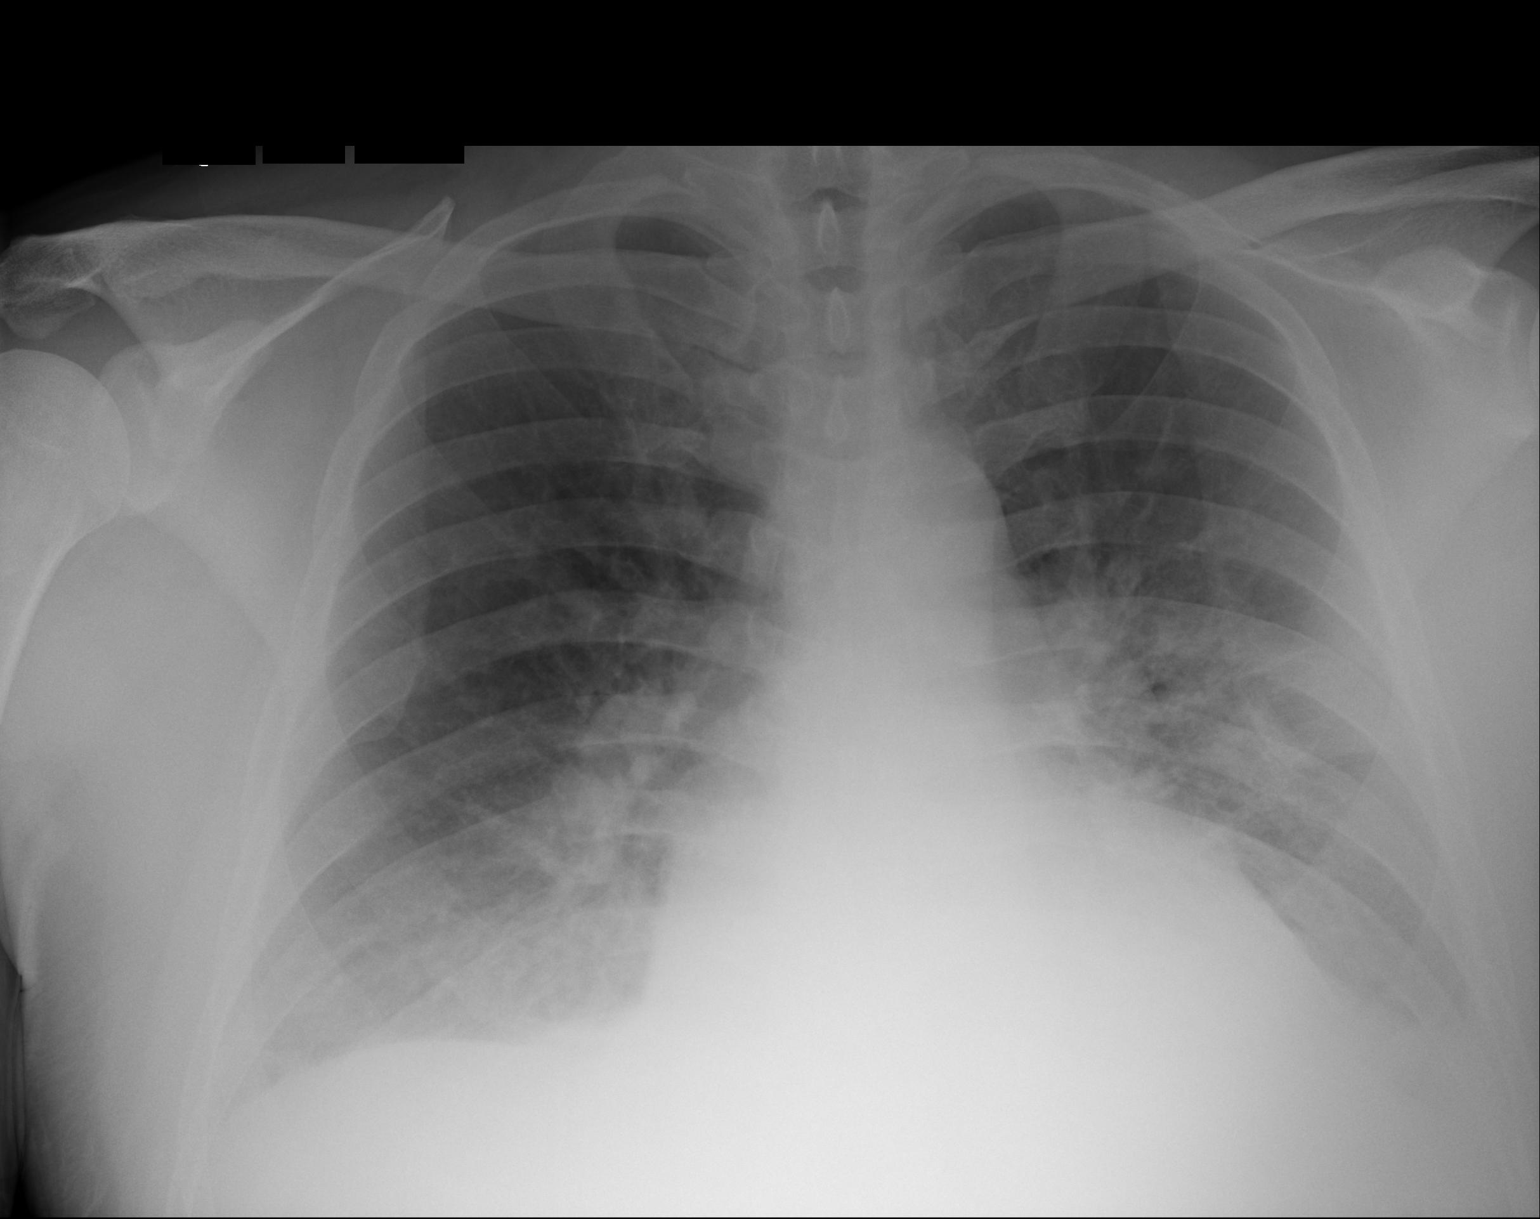

[1 of 1 positions shown; findings below may reference images not displayed]

FINDINGS: The lungs are well-aerated. Vascular congestion is noted. Bibasilar
airspace opacities raise concern for pulmonary edema. Small
bilateral pleural effusions are seen. There is no evidence of
pneumothorax.

The cardiomediastinal silhouette is mildly enlarged. No acute
osseous abnormalities are seen.
IMPRESSION: Vascular congestion and mild cardiomegaly. Bibasilar airspace
opacities raise concern for pulmonary edema. Small bilateral pleural
effusions seen.

## 2017-03-16 ENCOUNTER — Encounter: Payer: Self-pay | Admitting: Nurse Practitioner

## 2017-03-16 ENCOUNTER — Ambulatory Visit (INDEPENDENT_AMBULATORY_CARE_PROVIDER_SITE_OTHER): Payer: BLUE CROSS/BLUE SHIELD | Admitting: Nurse Practitioner

## 2017-03-16 VITALS — BP 106/80 | HR 56 | Ht 73.0 in | Wt 206.5 lb

## 2017-03-16 DIAGNOSIS — I2510 Atherosclerotic heart disease of native coronary artery without angina pectoris: Secondary | ICD-10-CM | POA: Diagnosis not present

## 2017-03-16 DIAGNOSIS — I480 Paroxysmal atrial fibrillation: Secondary | ICD-10-CM

## 2017-03-16 DIAGNOSIS — Z024 Encounter for examination for driving license: Secondary | ICD-10-CM | POA: Diagnosis not present

## 2017-03-16 DIAGNOSIS — I428 Other cardiomyopathies: Secondary | ICD-10-CM

## 2017-03-16 DIAGNOSIS — I5022 Chronic systolic (congestive) heart failure: Secondary | ICD-10-CM | POA: Diagnosis not present

## 2017-03-16 MED ORDER — APIXABAN 5 MG PO TABS: 5.0000 mg | ORAL_TABLET | Freq: Two times a day (BID) | ORAL | 2 refills | Status: DC

## 2017-03-16 MED ORDER — ATORVASTATIN CALCIUM 20 MG PO TABS: 20.0000 mg | ORAL_TABLET | Freq: Every day | ORAL | 2 refills | Status: DC

## 2017-03-16 MED ORDER — SPIRONOLACTONE 25 MG PO TABS: 25.0000 mg | ORAL_TABLET | Freq: Every day | ORAL | 2 refills | Status: DC

## 2017-03-16 MED ORDER — METOPROLOL SUCCINATE ER 50 MG PO TB24: 50.0000 mg | ORAL_TABLET | Freq: Every day | ORAL | 2 refills | Status: DC

## 2017-03-16 NOTE — Progress Notes (Signed)
Office Visit    Patient Name: Walter Banks Date of Encounter: 03/16/2017  Primary Care Provider:  Lorre Munroe, NP Primary Cardiologist:  Judie Petit. Kirke Corin, MD   Chief Complaint    56 y/o ? with a h/o PAF s/p DCCV (01/2016) and NICM w/ recovery of LV fxn, who presents for routine f/u.  Past Medical History    Past Medical History:  Diagnosis Date  . Chronic systolic CHF (congestive heart failure) (HCC)    a. 12/08/2015: echo showing EF of 20-25% w/ diffuse hypokinesis, severely dilated LA and RA, moderate MR and TR, PA peak pressure of 50 mmHg; b. 03/2016 Echo: EF 50-55%, no rwma, mildly dil LA, nl RV fxn.  . H. pylori infection   . History of pneumonia   . Mitral regurgitation    a. 12/2015 Echo: Mod MR;  b. 03/2016 Echo: No MR (EF normalized).  Marland Kitchen NICM (nonischemic cardiomyopathy) (HCC)    a. cardiac cath 12/10/15: pLAD 40%, o/w no stenosis, severely elevated right heart pressures;  b. 03/2016 Echo: EF 50-55%.  Marland Kitchen PAF (paroxysmal atrial fibrillation) (HCC)    a. Dx 12/2015;  b. s/p DCCV 01/2016;  c. on Eliquis [CHADS2VASc = 1 (CHF)]  . VT (ventricular tachycardia) (HCC)    a. 38 beat run during admission 12/2015-->associated with flushing.   Past Surgical History:  Procedure Laterality Date  . CARDIAC CATHETERIZATION N/A 12/10/2015   Procedure: Right and Left Heart Cath;  Surgeon: Antonieta Iba, MD;  Location: [facility] INVASIVE CV LAB;  Service: Cardiovascular;  Laterality: N/A;  . ELECTROPHYSIOLOGIC STUDY N/A 01/10/2016   Procedure: CARDIOVERSION;  Surgeon: Iran Ouch, MD;  Location: [facility] ORS;  Service: Cardiovascular;  Laterality: N/A;  . WRIST SURGERY Left 2013    Allergies  No Known Allergies  History of Present Illness    56 y/o ? with the above complex PMH including NICM in the setting of persistent Afib in the Spring of 2017.  EF was noted to be 20-25% @ that time.  Cath revealed moderate, nonobstructive LAD dzs.  He was medically managed and placed on eliquis with  subsequent DCCV in 01/2016.  F/u echo in 03/2016 showed improved EF of 50-55%.  He was last seen in clinic in 08/2016.  He has done remarkably well since that time.  He has been dieting under the guidance of a friend and has lost 50 lbs.  With that, he notes significantly improved energy and exercise tolerance.  He is active w/o chest pain, dyspnea, palpitations, pnd, orthopnea, n, v, dizziness, syncope, edema or early satiety.  He says that he had his lipids checked @ work earlier this year and his LDL was down to 32 (was 126 in 04/2016).  He continues to tolerate his medications well.  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  Home Medications    Prior to Admission medications   Medication Sig Start Date End Date Taking? Authorizing Provider  apixaban (ELIQUIS) 5 MG TABS tablet Take 1 tablet (5 mg total) by mouth 2 (two) times daily. 03/16/17  Yes Ok Anis, NP  atorvastatin (LIPITOR) 20 MG tablet Take 1 tablet (20 mg total) by mouth daily. 03/16/17  Yes Ok Anis, NP  metoprolol succinate (TOPROL XL) 50 MG 24 hr tablet Take 1 tablet (50 mg total) by mouth daily. Take with or immediately following a meal. 03/16/17  Yes Ok Anis, NP  multivitamin (ONE-A-DAY  MEN'S) TABS tablet Take 1 tablet by mouth daily.   Yes [provider]  sacubitril-valsartan (ENTRESTO) 24-26 MG Take 1 tablet by mouth 2 (two) times daily. 12/22/16  Yes Iran Ouch, MD  spironolactone (ALDACTONE) 25 MG tablet Take 1 tablet (25 mg total) by mouth daily. 03/16/17  Yes Ok Anis, NP    Review of Systems    As above, doing remarkably well.  He denies chest pain, palpitations, dyspnea, pnd, orthopnea, n, v, dizziness, syncope, edema, weight gain, or early satiety.  All other systems reviewed and are otherwise negative except as noted above.  Physical Exam    VS:  BP 106/80 (BP Location: Left Arm, Patient Position: Sitting, Cuff Size:  Normal)   Pulse (!) 56   Ht 6\' 1"  (1.854 m)   Wt 206 lb 8 oz (93.7 kg)   BMI 27.24 kg/m  , BMI Body mass index is 27.24 kg/m. GEN: Well nourished, well developed, in no acute distress.  HEENT: normal.  Neck: Supple, no JVD, carotid bruits, or masses. Cardiac: RRR, no murmurs, rubs, or gallops. No clubbing, cyanosis, edema.  Radials/DP/PT 2+ and equal bilaterally.  Respiratory:  Respirations regular and unlabored, clear to auscultation bilaterally. GI: Soft, nontender, nondistended, BS + x 4. MS: no deformity or atrophy. Skin: warm and dry, no rash. Neuro:  Strength and sensation are intact. Psych: Normal affect.  Accessory Clinical Findings    ECG - sinus brady 56, no acute st/t changes  Assessment & Plan    1.  NICM/Chronic systolic CHF:  H/o NICM in setting of Afib with subsequent recovery of LV fxn by echo in 03/2016.  He has been doing exceptionally well since his last visit in the Fall.  He has lost ~ 50 lbs by changing his diet.  He has been active w/o limitations.  He is quite pleased with his progress.  He remains on  blocker, entresto, and spironolactone therapy. I will f/u a BMET today.  2.  PAF:  Maintaining sinus on  blocker therapy.  Anticoagulated with eliquis.  CBC today.  3.  Nonobstructive CAD:  40% LAD stenosis on cath in 12/2015.  No chest pain. Cont  blocker and statin. No asa 2/2 eliquis therapy.  4.  Dispo:  F/u cbc and bmet today.  F/u with M. Kirke Corin, MD in 6 mos or sooner if necessary.   Nicolasa Ducking, NP 03/16/2017, 2:30 PM

## 2017-03-16 NOTE — Patient Instructions (Addendum)
Medication Instructions:  Your physician recommends that you continue on your current medications as directed. Please refer to the Current Medication list given to you today.   Labwork: BMET and CBC today  Testing/Procedures: Your physician has requested that you have an exercise tolerance test. For further information please visit [email or web address] Please also follow instruction sheet, as given.  Do not take metoprolol the morning of your treadmill test.   Follow-Up: Your physician wants you to follow-up in: six months with Dr. Kirke Corin.  You will receive a reminder letter in the mail two months in advance. If you don't receive a letter, please call our office to schedule the follow-up appointment.   Any Other Special Instructions Will Be Listed Below (If Applicable).     If you need a refill on your cardiac medications before your next appointment, please call your pharmacy.   Exercise Stress Electrocardiogram An exercise stress electrocardiogram is a test to check how blood flows to your heart. It is done to find areas of poor blood flow. You will need to walk on a treadmill for this test. The electrocardiogram will record your heartbeat when you are at rest and when you are exercising. What happens before the procedure?  Do not have drinks with caffeine or foods with caffeine for 24 hours before the test, or as told by your doctor. This includes coffee, tea (even decaf tea), sodas, chocolate, and cocoa.  Follow your doctor's instructions about eating and drinking before the test.  Ask your doctor what medicines you should or should not take before the test. Take your medicines with water unless told by your doctor not to.  If you use an inhaler, bring it with you to the test.  Bring a snack to eat after the test.  Do not  smoke for 4 hours before the test.  Do not put lotions, powders, creams, or oils on your chest before the test.  Wear comfortable shoes and  clothing. What happens during the procedure?  You will have patches put on your chest. Small areas of your chest may need to be shaved. Wires will be connected to the patches.  Your heart rate will be watched while you are resting and while you are exercising.  You will walk on the treadmill. The treadmill will slowly get faster to raise your heart rate.  The test will take about 1-2 hours. What happens after the procedure?  Your heart rate and blood pressure will be watched after the test.  You may return to your normal diet, activities, and medicines or as told by your doctor. This information is not intended to replace advice given to you by your health care provider. Make sure you discuss any questions you have with your health care provider. Document Released: 03/09/2008 Document Revised: 05/20/2016 Document Reviewed: 05/29/2013 Elsevier Interactive Patient Education  [employer].

## 2017-03-17 LAB — BASIC METABOLIC PANEL
BUN/Creatinine Ratio: 23 — ABNORMAL HIGH (ref 9–20)
BUN: 23 mg/dL (ref 6–24)
CALCIUM: 9.1 mg/dL (ref 8.7–10.2)
CO2: 20 mmol/L (ref 20–29)
Chloride: 103 mmol/L (ref 96–106)
Creatinine, Ser: 1.02 mg/dL (ref 0.76–1.27)
GFR, EST AFRICAN AMERICAN: 95 mL/min/{1.73_m2} (ref >59)
GFR, EST NON AFRICAN AMERICAN: 82 mL/min/{1.73_m2} (ref >59)
Glucose: 81 mg/dL (ref 65–99)
Potassium: 4.1 mmol/L (ref 3.5–5.2)
Sodium: 142 mmol/L (ref 134–144)

## 2017-03-17 LAB — CBC
HEMATOCRIT: 42.6 % (ref 37.5–51.0)
HEMOGLOBIN: 14.5 g/dL (ref 13.0–17.7)
MCH: 31.9 pg (ref 26.6–33.0)
MCHC: 34 g/dL (ref 31.5–35.7)
MCV: 94 fL (ref 79–97)
Platelets: 203 10*3/uL (ref 150–379)
RBC: 4.54 x10E6/uL (ref 4.14–5.80)
RDW: 13.9 % (ref 12.3–15.4)
WBC: 5.9 10*3/uL (ref 3.4–10.8)

## 2017-04-05 ENCOUNTER — Telehealth: Payer: Self-pay | Admitting: Cardiovascular Disease

## 2017-04-05 NOTE — Telephone Encounter (Signed)
Left detailed voicemail message to hold metoprolol prior to stress test tomorrow and to wear tennis shoes or footwear appropriate for treadmill.

## 2017-04-05 NOTE — Telephone Encounter (Signed)
Left detailed voicemail message with stress test time and instructions to hold metoprolol in the morning and to call back if any further questions.

## 2017-04-06 ENCOUNTER — Ambulatory Visit (INDEPENDENT_AMBULATORY_CARE_PROVIDER_SITE_OTHER): Payer: BLUE CROSS/BLUE SHIELD

## 2017-04-06 DIAGNOSIS — I5022 Chronic systolic (congestive) heart failure: Secondary | ICD-10-CM

## 2017-04-06 DIAGNOSIS — Z024 Encounter for examination for driving license: Secondary | ICD-10-CM | POA: Diagnosis not present

## 2017-04-08 ENCOUNTER — Telehealth: Payer: Self-pay | Admitting: Cardiovascular Disease

## 2017-04-08 LAB — EXERCISE TOLERANCE TEST
CHL CUP MPHR: 164 {beats}/min
CHL CUP RESTING HR STRESS: 60 {beats}/min
Estimated workload: 12.8 METS
Exercise duration (min): 10 min
Exercise duration (sec): 41 s
Peak HR: 173 {beats}/min
Percent HR: 105 %

## 2017-04-08 NOTE — Telephone Encounter (Signed)
Pt calling back to let us know where to send his recent stress test  He does need [place] to send it to [employer]  Please fax to [phone]   Attn: Jae Dire  Phone number to her is [phone]

## 2017-04-08 NOTE — Telephone Encounter (Signed)
Left detailed voicemail message that results were sent to number provided and to call back if I can assist with anything else.

## 2017-04-22 ENCOUNTER — Telehealth: Payer: Self-pay | Admitting: Cardiovascular Disease

## 2017-04-22 MED ORDER — SPIRONOLACTONE 25 MG PO TABS: 25.0000 mg | ORAL_TABLET | Freq: Every day | ORAL | 3 refills | Status: DC

## 2017-04-22 MED ORDER — APIXABAN 5 MG PO TABS: 5.0000 mg | ORAL_TABLET | Freq: Two times a day (BID) | ORAL | 6 refills | Status: DC

## 2017-04-22 MED ORDER — ATORVASTATIN CALCIUM 20 MG PO TABS: 20.0000 mg | ORAL_TABLET | Freq: Every day | ORAL | 3 refills | Status: DC

## 2017-04-22 MED ORDER — METOPROLOL SUCCINATE ER 50 MG PO TB24: 50.0000 mg | ORAL_TABLET | Freq: Every day | ORAL | 2 refills | Status: DC

## 2017-04-22 MED ORDER — METOPROLOL SUCCINATE ER 50 MG PO TB24: 50.0000 mg | ORAL_TABLET | Freq: Every day | ORAL | 3 refills | Status: DC

## 2017-04-22 MED ORDER — APIXABAN 5 MG PO TABS: 5.0000 mg | ORAL_TABLET | Freq: Two times a day (BID) | ORAL | 3 refills | Status: DC

## 2017-04-22 NOTE — Telephone Encounter (Signed)
Spoke with patient and let him know to check with his PCP regarding his other request. Refills sent into [facility]. He was appreciative for the call and had no further questions at this time.

## 2017-04-22 NOTE — Telephone Encounter (Signed)
Spoke with patient and he needs refills but also wanted to check on medication to help with his sex drive. Let him know that in some cases it is not indicated and it may be necessary for him to check with his PCP but that I would check with Dr. Kirke Corin. He was appreciative for the call and sent in refills of his medications.

## 2017-04-22 NOTE — Telephone Encounter (Signed)
Pt is wanted to know if we have any samples for his sex drive. He is not sure but feels like it is lower than normal. He states he has not tried anything before. He is just having issues.  Would like to know if we have any suggestions or samples  Please advise.     *STAT* If patient is at the pharmacy, call can be transferred to refill team.   1. Which medications need to be refilled? (please list name of each medication and dose if known)  Spitronolactone  Metoprolol Atorvastatin  Eliquis   2. Which pharmacy/location (including street and city if local pharmacy) is medication to be sent to? [facility] [address]   3. Do they need a 30 day or 90 day supply? 90 day

## 2017-04-22 NOTE — Telephone Encounter (Signed)
That has to be handled by primary care physician.

## 2017-12-17 ENCOUNTER — Telehealth: Payer: Self-pay | Admitting: Cardiovascular Disease

## 2017-12-17 MED ORDER — SACUBITRIL-VALSARTAN 24-26 MG PO TABS: 1.0000 | ORAL_TABLET | Freq: Two times a day (BID) | ORAL | 3 refills | Status: DC

## 2017-12-17 NOTE — Telephone Encounter (Signed)
°*  STAT* If patient is at the pharmacy, call can be transferred to refill team. ° ° °1. Which medications need to be refilled? (please list name of each medication and dose if known) entresto  ° °2. Which pharmacy/location (including street and city if local pharmacy) is medication to be sent to? [facility] [address] ° °3. Do they need a 30 day or 90 day supply? 90 day  ° ° °

## 2018-03-28 ENCOUNTER — Telehealth: Payer: Self-pay | Admitting: Cardiovascular Disease

## 2018-03-28 DIAGNOSIS — I5022 Chronic systolic (congestive) heart failure: Secondary | ICD-10-CM

## 2018-03-28 NOTE — Telephone Encounter (Signed)
I left a message for the patient to call. 

## 2018-03-28 NOTE — Telephone Encounter (Signed)
Patient calling  Is needing a [employer] clearance and has a few questions regarding last results Please call to discuss

## 2018-04-01 NOTE — Progress Notes (Deleted)
Cardiology Office Note Date:  04/01/2018  Patient ID:  Walter Banks, Walter Banks [birth year], MRN [id number] PCP:  Lorre Munroe, NP  Cardiologist:  Dr. Kirke Corin, MD  ***refresh   Chief Complaint: Follow up  History of Present Illness: Walter Banks is a 57 y.o. male with history of chronic systolic CHF due to NICM with subsequent recovery, PAF s/p DCCV on Eliquis, and VT who presents for follow up of his NICM and Afib.   Patient was admitted to the hospital in 2017 with persistent Afib. EF noted to be be 20-25% at that time. LHC showed moderate, nonobstructive LAD disease. He was medically managed and placed on Eliquis. He underwent successful DCCV in 01/2016. Follow up TTE in 03/2016 showed an improved LVSF with an EF of 50-55%. He was most recently seen in 03/2017 for follow up and was doing quite well. His weight was noted to be down ~ 50 pounds, the patient had been dieting with the advice of a friend. He reported an improved LDL, when checked at work, to be 32 (prior 126 in 04/2016). 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   ***   Past Medical History:  Diagnosis Date  . Chronic systolic CHF (congestive heart failure) (HCC)    a. 12/08/2015: echo showing EF of 20-25% w/ diffuse hypokinesis, severely dilated LA and RA, moderate MR and TR, PA peak pressure of 50 mmHg; b. 03/2016 Echo: EF 50-55%, no rwma, mildly dil LA, nl RV fxn.  . H. pylori infection   . History of pneumonia   . Mitral regurgitation    a. 12/2015 Echo: Mod MR;  b. 03/2016 Echo: No MR (EF normalized).  Marland Kitchen NICM (nonischemic cardiomyopathy) (HCC)    a. cardiac cath 12/10/15: pLAD 40%, o/w no stenosis, severely elevated right heart pressures;  b. 03/2016 Echo: EF 50-55%.  Marland Kitchen PAF (paroxysmal atrial fibrillation) (HCC)    a. Dx 12/2015;  b. s/p DCCV 01/2016;  c. on Eliquis [CHADS2VASc = 1 (CHF)]  . VT (ventricular tachycardia) (HCC)    a. 38 beat run during admission 12/2015-->associated with  flushing.    Past Surgical History:  Procedure Laterality Date  . CARDIAC CATHETERIZATION N/A 12/10/2015   Procedure: Right and Left Heart Cath;  Surgeon: Antonieta Iba, MD;  Location: [facility] INVASIVE CV LAB;  Service: Cardiovascular;  Laterality: N/A;  . ELECTROPHYSIOLOGIC STUDY N/A 01/10/2016   Procedure: CARDIOVERSION;  Surgeon: Iran Ouch, MD;  Location: [facility] ORS;  Service: Cardiovascular;  Laterality: N/A;  . WRIST SURGERY Left 2013    No outpatient medications have been marked as taking for the 04/04/18 encounter (Appointment) with Sondra Barges, PA-C.    Allergies:   Patient has no known allergies.   Social History:  The patient  reports that he has never smoked. He has never used smokeless tobacco. He reports that he drinks about 4.8 oz of alcohol per week. He reports that he does not use drugs.   Family History:  The patient's family history includes Diabetes in his brother, father, and mother; Heart attack in his father; Heart disease in his mother; Heart failure in his sister; Hypertension in his father.  ROS:   ROS   PHYSICAL EXAM: *** VS:  There were no vitals taken for this visit. BMI: There is no height or weight on file to calculate BMI.  Physical Exam   EKG:  Was ordered and interpreted by me today. Shows ***  Recent Labs: No results found for requested labs within last 8760 hours.  No results found for requested labs within last 8760 hours.   CrCl cannot be calculated (Patient's most recent lab result is older than the maximum 21 days allowed.).   Wt Readings from Last 3 Encounters:  03/16/17 206 lb 8 oz (93.7 kg)  08/18/16 257 lb 12 oz (116.9 kg)  04/13/16 248 lb 4 oz (112.6 kg)     Other studies reviewed: Additional studies/records reviewed today include: summarized above  ASSESSMENT AND PLAN:  1. ***  Disposition: F/u with *** in   Current medicines are reviewed at length with the patient today.  The patient did not have any concerns regarding  medicines.  Signed, Eula Listen, PA-C 04/01/2018 10:59 AM     [facility] [place] [address] [place], [place] [address] [phone]

## 2018-04-01 NOTE — Telephone Encounter (Signed)
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 Current certification expires on 04/10/18.  The patient has an appt on Monday 04/04/18 with Eula Listen, PA and is requesting that an echo be done that day/ prior to 04/10/18.   Patient contact #- [phone]) [phone] Fast Med #- [phone]  To Dr. Kirke Corin to give an ok to order his echo. Paperwork given to [facility] to hold for his appt on Monday 04/04/18.

## 2018-04-01 NOTE — Telephone Encounter (Signed)
Echo is fine

## 2018-04-04 ENCOUNTER — Telehealth: Payer: Self-pay | Admitting: Cardiovascular Disease

## 2018-04-04 ENCOUNTER — Ambulatory Visit: Payer: BLUE CROSS/BLUE SHIELD | Admitting: Physician Assistant

## 2018-04-04 ENCOUNTER — Encounter

## 2018-04-04 NOTE — Telephone Encounter (Signed)
L MOM to call and schedule echocardiogram before 04/10/2018

## 2018-04-04 NOTE — Telephone Encounter (Signed)
The patient was scheduled to be seen by Alycia Rossetti, PA today.  His office schedule was cancelled due to the hospital schedule I have placed an order for the patient to have an echo done and sent a message to scheduling to please call him to arrange prior to 04/10/18.

## 2018-04-05 ENCOUNTER — Other Ambulatory Visit: Payer: Self-pay

## 2018-04-05 ENCOUNTER — Ambulatory Visit (INDEPENDENT_AMBULATORY_CARE_PROVIDER_SITE_OTHER): Payer: BLUE CROSS/BLUE SHIELD

## 2018-04-05 DIAGNOSIS — I5022 Chronic systolic (congestive) heart failure: Secondary | ICD-10-CM | POA: Diagnosis not present

## 2018-04-11 ENCOUNTER — Ambulatory Visit: Payer: BLUE CROSS/BLUE SHIELD | Admitting: Nurse Practitioner

## 2018-04-11 ENCOUNTER — Encounter: Payer: Self-pay | Admitting: Nurse Practitioner

## 2018-04-11 VITALS — BP 118/78 | HR 55 | Ht 73.0 in | Wt 230.5 lb

## 2018-04-11 DIAGNOSIS — I5022 Chronic systolic (congestive) heart failure: Secondary | ICD-10-CM | POA: Diagnosis not present

## 2018-04-11 DIAGNOSIS — I480 Paroxysmal atrial fibrillation: Secondary | ICD-10-CM | POA: Diagnosis not present

## 2018-04-11 DIAGNOSIS — E782 Mixed hyperlipidemia: Secondary | ICD-10-CM | POA: Diagnosis not present

## 2018-04-11 NOTE — Progress Notes (Signed)
Office Visit    Patient Name: Walter Banks Date of Encounter: 04/11/2018  Primary Care Provider:  Lorre Munroe, NP Primary Cardiologist:  Lorine Bears, MD  Chief Complaint    57 year old male with a history of paroxysmal atrial fibrillation status post cardioversion in April 2017 and nonischemic cardiomyopathy with recovery of LV function, who presents for follow-up.  Past Medical History    Past Medical History:  Diagnosis Date  . Chronic systolic CHF (congestive heart failure) (HCC)    a. 12/08/2015 Echo: EF of 20-25% w/ diffuse hypokinesis, severely dilated LA and RA, moderate MR and TR, PA peak pressure of 50 mmHg; b. 03/2016 Echo: EF 50-55%, no rwma, mildly dil LA, nl RV fxn; c. 04/2018 Echo: EF 55-60%, mild LVH. Triv AI. Mildly dil Ao arch. Mildly dil LA/RV/RA. PASP .  Marland Kitchen H. pylori infection   . History of pneumonia   . Mitral regurgitation    a. 12/2015 Echo: Mod MR;  b. 03/2016 Echo: No MR (EF normalized).  Marland Kitchen NICM (nonischemic cardiomyopathy) (HCC)    a. cardiac cath 12/10/15: pLAD 40%, o/w no stenosis, severely elevated right heart pressures;  b. 03/2016 Echo: EF 50-55%; c. 04/2018 Echo: EF 55-60%.  Marland Kitchen PAF (paroxysmal atrial fibrillation) (HCC)    a. Dx 12/2015;  b. s/p DCCV 01/2016;  c. on Eliquis [CHADS2VASc = 1 (CHF)]  . VT (ventricular tachycardia) (HCC)    a. 38 beat run during admission 12/2015-->associated with flushing.   Past Surgical History:  Procedure Laterality Date  . CARDIAC CATHETERIZATION N/A 12/10/2015   Procedure: Right and Left Heart Cath;  Surgeon: Antonieta Iba, MD;  Location: [facility] INVASIVE CV LAB;  Service: Cardiovascular;  Laterality: N/A;  . ELECTROPHYSIOLOGIC STUDY N/A 01/10/2016   Procedure: CARDIOVERSION;  Surgeon: Iran Ouch, MD;  Location: [facility] ORS;  Service: Cardiovascular;  Laterality: N/A;  . WRIST SURGERY Left 2013    Allergies  No Known Allergies  History of Present Illness    57 year old male with the above complex  past medical history including nonischemic cardiomyopathy in the setting of persistent atrial fibrillation in the spring 2017.  EF was noted to be 2025% at that time.  Catheterization revealed moderate, nonobstructive LAD disease.  He was medically managed and placed on Eliquis with subsequent cardioversion in April 2017.  Follow-up echo in June 2017 showed improved EF of 50 to 55%.    I last saw him in clinic in June 2018, at which time he was doing remarkably well.  His weight was down from 260 pounds to 206 pounds.  He was very active without symptoms or limitations.  Stress testing in 2018 was normal.  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  Over the past year he is continued to do remarkably well.  He is very active at work but does not routinely exercise.  His weight is up 24 pounds from a year ago and he thinks this may be related to drinking a fair amount of craft beers (higher ABV) twice a week.  He says he thinks he might have as many as 5 or 6 pints of beer each time he goes to a local bar.  He has otherwise try to adhere to a healthy diet with plenty of fruits and vegetables.  Other than weight gain, he is not done well over the past year.  He denies chest pain, dyspnea, palpitations, PND, orthopnea, dizziness, syncope, edema, or early satiety.  Home Medications  Prior to Admission medications   Medication Sig Start Date End Date Taking? Authorizing Provider  apixaban (ELIQUIS) 5 MG TABS tablet Take 1 tablet (5 mg total) by mouth 2 (two) times daily. 04/22/17  Yes Iran Ouch, MD  atorvastatin (LIPITOR) 20 MG tablet Take 1 tablet (20 mg total) by mouth daily. 04/22/17  Yes Iran Ouch, MD  metoprolol succinate (TOPROL XL) 50 MG 24 hr tablet Take 1 tablet (50 mg total) by mouth daily. Take with or immediately following a meal. 04/22/17  Yes Iran Ouch, MD  multivitamin (ONE-A-DAY MEN'S) TABS tablet Take 1 tablet by mouth daily.   Yes [provider]    sacubitril-valsartan (ENTRESTO) 24-26 MG Take 1 tablet by mouth 2 (two) times daily. 12/17/17  Yes Iran Ouch, MD  spironolactone (ALDACTONE) 25 MG tablet Take 1 tablet (25 mg total) by mouth daily. 04/22/17  Yes Iran Ouch, MD    Review of Systems    He denies chest pain, palpitations, dyspnea, pnd, orthopnea, n, v, dizziness, syncope, edema, weight gain, or early satiety.  All other systems reviewed and are otherwise negative except as noted above.  Physical Exam    VS:  BP 118/78 (BP Location: Left Arm, Patient Position: Sitting, Cuff Size: Large)   Pulse (!) 55   Ht 6\' 1"  (1.854 m)   Wt 230 lb 8 oz (104.6 kg)   BMI 30.41 kg/m  , BMI Body mass index is 30.41 kg/m. GEN: Well nourished, well developed, in no acute distress.  HEENT: normal.  Neck: Supple, no JVD, carotid bruits, or masses. Cardiac: RRR, no murmurs, rubs, or gallops. No clubbing, cyanosis, edema.  Radials/DP/PT 2+ and equal bilaterally.  Respiratory:  Respirations regular and unlabored, clear to auscultation bilaterally. GI: Soft, nontender, nondistended, BS + x 4. MS: no deformity or atrophy. Skin: warm and dry, no rash. Neuro:  Strength and sensation are intact. Psych: Normal affect.  Accessory Clinical Findings    ECG -sinus bradycardia, 55, left axis deviation, left anterior fascicular block, no acute ST or T changes.  Assessment & Plan    1.  Nonischemic cardiomyopathy/chronic systolic congestive heart failure: History of nonischemic cardiomyopathy in the setting of A. fib with subsequent recovery of LV function by echo in June 2017.  He had recent echo that continues to show normal LV function.  He is euvolemic on exam today.  He remains on beta-blocker, Entresto, and Spironolactone therapy.  I will follow-up a basic metabolic panel today.  As previously noted, he is cleared to continue driving a truck.  2.  Paroxysmal atrial fibrillation: Maintaining sinus rhythm on beta-blocker therapy.  No  palpitations or recurrent A. fib that he is aware of.  He remains anticoagulated on Eliquis.  I will follow-up a CBC today.  3.  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  No chest pain.  Continue beta-blocker and statin.  No aspirin secondary to Eliquis therapy.  4.  Hyperlipidemia: I will follow-up lipids and LFTs today.  He is not fasting and therefore we will get a direct LDL.  5.  Alcohol use: He says he is increased his alcohol use in the past 6 months.  He drinks as many as 10 to 12 pints of high ABV craft beer a week.  We had a discussion today about limiting alcohol usage in the setting of prior cardiomyopathy.  I also suspect this contributes to his weight gain as he is likely introducing an additional  2000-2500 cal into his diet per week just in beer.  He says he is going to significantly reduce his alcohol consumption with a goal of weight loss.  6.  Disposition: Follow-up lab work today.  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  Follow-up shortly thereafter next year.  Nicolasa Ducking, NP 04/11/2018, 5:35 PM

## 2018-04-11 NOTE — Patient Instructions (Signed)
Medication Instructions:  Your physician recommends that you continue on your current medications as directed. Please refer to the Current Medication list given to you today.   Labwork: Your physician recommends that you return for lab work in: TODAY (CMET, CBC, LIPIDS, DIRECT LDL).   Testing/Procedures: NONE  Follow-Up: Your physician recommends that you schedule a follow-up appointment in: 10 MONTHS WITH DR ARIDA.    If you need a refill on your cardiac medications before your next appointment, please call your pharmacy.

## 2018-04-12 ENCOUNTER — Ambulatory Visit: Payer: BLUE CROSS/BLUE SHIELD | Admitting: Physician Assistant

## 2018-04-12 LAB — CBC WITH DIFFERENTIAL/PLATELET
BASOS ABS: 0 10*3/uL (ref 0.0–0.2)
BASOS: 0 %
EOS (ABSOLUTE): 0.1 10*3/uL (ref 0.0–0.4)
Eos: 2 %
Hematocrit: 39.3 % (ref 37.5–51.0)
Hemoglobin: 13.4 g/dL (ref 13.0–17.7)
IMMATURE GRANS (ABS): 0 10*3/uL (ref 0.0–0.1)
Immature Granulocytes: 0 %
LYMPHS: 27 %
Lymphocytes Absolute: 1.5 10*3/uL (ref 0.7–3.1)
MCH: 31.6 pg (ref 26.6–33.0)
MCHC: 34.1 g/dL (ref 31.5–35.7)
MCV: 93 fL (ref 79–97)
MONOS ABS: 0.7 10*3/uL (ref 0.1–0.9)
Monocytes: 12 %
NEUTROS ABS: 3.2 10*3/uL (ref 1.4–7.0)
NEUTROS PCT: 59 %
PLATELETS: 207 10*3/uL (ref 150–450)
RBC: 4.24 x10E6/uL (ref 4.14–5.80)
RDW: 12.9 % (ref 12.3–15.4)
WBC: 5.5 10*3/uL (ref 3.4–10.8)

## 2018-04-12 LAB — COMPREHENSIVE METABOLIC PANEL
A/G RATIO: 1.8 (ref 1.2–2.2)
ALK PHOS: 66 IU/L (ref 39–117)
ALT: 22 IU/L (ref 0–44)
AST: 26 IU/L (ref 0–40)
Albumin: 4.6 g/dL (ref 3.5–5.5)
BILIRUBIN TOTAL: 0.3 mg/dL (ref 0.0–1.2)
BUN/Creatinine Ratio: 26 — ABNORMAL HIGH (ref 9–20)
BUN: 27 mg/dL — AB (ref 6–24)
CHLORIDE: 102 mmol/L (ref 96–106)
CO2: 26 mmol/L (ref 20–29)
Calcium: 9.2 mg/dL (ref 8.7–10.2)
Creatinine, Ser: 1.02 mg/dL (ref 0.76–1.27)
GFR calc Af Amer: 94 mL/min/{1.73_m2} (ref >59)
GFR calc non Af Amer: 81 mL/min/{1.73_m2} (ref >59)
GLUCOSE: 82 mg/dL (ref 65–99)
Globulin, Total: 2.6 g/dL (ref 1.5–4.5)
POTASSIUM: 5.1 mmol/L (ref 3.5–5.2)
Sodium: 143 mmol/L (ref 134–144)
TOTAL PROTEIN: 7.2 g/dL (ref 6.0–8.5)

## 2018-04-12 LAB — LDL CHOLESTEROL, DIRECT: LDL DIRECT: 110 mg/dL — AB (ref 0–99)

## 2018-04-12 LAB — LIPID PANEL
CHOLESTEROL TOTAL: 173 mg/dL (ref 100–199)
Chol/HDL Ratio: 3.3 ratio (ref 0.0–5.0)
HDL: 52 mg/dL (ref >39)
LDL Calculated: 105 mg/dL — ABNORMAL HIGH (ref 0–99)
TRIGLYCERIDES: 79 mg/dL (ref 0–149)
VLDL CHOLESTEROL CAL: 16 mg/dL (ref 5–40)

## 2018-05-31 ENCOUNTER — Ambulatory Visit: Payer: BLUE CROSS/BLUE SHIELD | Admitting: Nurse Practitioner

## 2018-11-15 ENCOUNTER — Other Ambulatory Visit: Payer: Self-pay | Admitting: Cardiovascular Disease

## 2018-11-16 ENCOUNTER — Telehealth: Payer: Self-pay | Admitting: Cardiovascular Disease

## 2018-11-16 NOTE — Telephone Encounter (Signed)
-----   Message from Margrett Rud, [place] sent at 11/15/2018  8:51 AM EST ----- Regarding: patient needs an appointment Can you please try to contact patient. Looks like he will need a follow up in May  Thanks [employer]

## 2018-11-16 NOTE — Telephone Encounter (Signed)
LMOV to schedule follow up 

## 2018-11-17 NOTE — Telephone Encounter (Signed)
Patient scheduled 5/12 with Dr Kirke Corin

## 2019-02-10 ENCOUNTER — Telehealth: Payer: Self-pay

## 2019-02-10 NOTE — Telephone Encounter (Signed)
Virtual Visit Pre-Appointment Phone Call  "(Name), I am calling you today to discuss your upcoming appointment. We are currently trying to limit exposure to the virus that causes COVID-19 by seeing patients at home rather than in the office."  1. "What is the BEST phone number to call the day of the visit?" - include this in appointment notes  2. "Do you have or have access to (through a family member/friend) a smartphone with video capability that we can use for your visit?" a. If yes - list this number in appt notes as "cell" (if different from BEST phone #) and list the appointment type as a VIDEO visit in appointment notes b. If no - list the appointment type as a PHONE visit in appointment notes  3. Confirm consent - "In the setting of the current Covid19 crisis, you are scheduled for a (phone or video) visit with your provider on (date) at (time).  Just as we do with many in-office visits, in order for you to participate in this visit, we must obtain consent.  If you'd like, I can send this to your mychart (if signed up) or email for you to review.  Otherwise, I can obtain your verbal consent now.  All virtual visits are billed to your insurance company just like a normal visit would be.  By agreeing to a virtual visit, we'd like you to understand that the technology does not allow for your provider to perform an examination, and thus may limit your provider's ability to fully assess your condition. If your provider identifies any concerns that need to be evaluated in person, we will make arrangements to do so.  Finally, though the technology is pretty good, we cannot assure that it will always work on either your or our end, and in the setting of a video visit, we may have to convert it to a phone-only visit.  In either situation, we cannot ensure that we have a secure connection.  Are you willing to proceed?" STAFF: Did the patient verbally acknowledge consent to telehealth visit? Document  YES/NO here:YES  4.   5. Advise patient to be prepared - "Two hours prior to your appointment, go ahead and check your blood pressure, pulse, oxygen saturation, and your weight (if you have the equipment to check those) and write them all down. When your visit starts, your provider will ask you for this information. If you have an Apple Watch or Kardia device, please plan to have heart rate information ready on the day of your appointment. Please have a pen and paper handy nearby the day of the visit as well."  6. Give patient instructions for MyChart download to smartphone OR Doximity/Doxy.me as below if video visit (depending on what platform provider is using)  7. Inform patient they will receive a phone call 15 minutes prior to their appointment time (may be from unknown caller ID) so they should be prepared to answer    TELEPHONE CALL NOTE  Walter Banks has been deemed a candidate for a follow-up tele-health visit to limit community exposure during the Covid-19 pandemic. I spoke with the patient via phone to ensure availability of phone/video source, confirm preferred email & phone number, and discuss instructions and expectations.  I reminded Vivianne Spence to be prepared with any vital sign and/or heart rhythm information that could potentially be obtained via home monitoring, at the time of his visit. I reminded Vivianne Spence to expect a phone call  prior to his visit.  Bishop Dublin, [facility] 02/10/2019 3:48 PM   INSTRUCTIONS FOR DOWNLOADING THE MYCHART APP TO SMARTPHONE  - The patient must first make sure to have activated MyChart and know their login information - If Apple, go to [employer] and type in MyChart in the search bar and download the app. If Android, ask patient to go to [employer] and type in [place] in the search bar and download the app. The app is free but as with any other app downloads, their phone may require them to verify saved payment information or  Apple/Android password.  - The patient will need to then log into the app with their MyChart username and password, and select [facility] as their healthcare provider to link the account. When it is time for your visit, go to the MyChart app, find appointments, and click Begin Video Visit. Be sure to Select Allow for your device to access the Microphone and Camera for your visit. You will then be connected, and your provider will be with you shortly.  **If they have any issues connecting, or need assistance please contact MyChart service desk [phone])**  **If using a computer, in order to ensure the best quality for their visit they will need to use either of the following Internet Browsers: [employer], or Google Chrome**  IF USING DOXIMITY or DOXY.ME - The patient will receive a link just prior to their visit by text.     FULL LENGTH CONSENT FOR TELE-HEALTH VISIT   I hereby voluntarily request, consent and authorize [facility] and its employed or contracted physicians, physician assistants, nurse practitioners or other licensed health care professionals (the Practitioner), to provide me with telemedicine health care services (the "Services") as deemed necessary by the treating Practitioner. I acknowledge and consent to receive the Services by the Practitioner via telemedicine. I understand that the telemedicine visit will involve communicating with the Practitioner through live audiovisual communication technology and the disclosure of certain medical information by electronic transmission. I acknowledge that I have been given the opportunity to request an in-person assessment or other available alternative prior to the telemedicine visit and am voluntarily participating in the telemedicine visit.  I understand that I have the right to withhold or withdraw my consent to the use of telemedicine in the course of my care at any time, without affecting my right to future care  or treatment, and that the Practitioner or I may terminate the telemedicine visit at any time. I understand that I have the right to inspect all information obtained and/or recorded in the course of the telemedicine visit and may receive copies of available information for a reasonable fee.  I understand that some of the potential risks of receiving the Services via telemedicine include:  Marland Kitchen Delay or interruption in medical evaluation due to technological equipment failure or disruption; . Information transmitted may not be sufficient (e.g. poor resolution of images) to allow for appropriate medical decision making by the Practitioner; and/or  . In rare instances, security protocols could fail, causing a breach of personal health information.  Furthermore, I acknowledge that it is my responsibility to provide information about my medical history, conditions and care that is complete and accurate to the best of my ability. I acknowledge that Practitioner's advice, recommendations, and/or decision may be based on factors not within their control, such as incomplete or inaccurate data provided by me or distortions of diagnostic images or specimens that may result from electronic  transmissions. I understand that the practice of medicine is not an exact science and that Practitioner makes no warranties or guarantees regarding treatment outcomes. I acknowledge that I will receive a copy of this consent concurrently upon execution via email to the email address I last provided but may also request a printed copy by calling the office of [place].    I understand that my insurance will be billed for this visit.   I have read or had this consent read to me. . I understand the contents of this consent, which adequately explains the benefits and risks of the Services being provided via telemedicine.  . I have been provided ample opportunity to ask questions regarding this consent and the Services and have had  my questions answered to my satisfaction. . I give my informed consent for the services to be provided through the use of telemedicine in my medical care  By participating in this telemedicine visit I agree to the above.

## 2019-02-14 ENCOUNTER — Telehealth: Payer: BLUE CROSS/BLUE SHIELD | Admitting: Cardiovascular Disease

## 2019-02-14 ENCOUNTER — Other Ambulatory Visit: Payer: Self-pay

## 2019-02-27 ENCOUNTER — Other Ambulatory Visit: Payer: Self-pay | Admitting: Cardiovascular Disease

## 2019-03-16 ENCOUNTER — Telehealth (INDEPENDENT_AMBULATORY_CARE_PROVIDER_SITE_OTHER): Payer: BC Managed Care – PPO | Admitting: General Practice

## 2019-03-16 ENCOUNTER — Other Ambulatory Visit: Payer: Self-pay

## 2019-03-16 ENCOUNTER — Telehealth: Payer: Self-pay

## 2019-03-16 VITALS — BP 133/84 | HR 64 | Ht 73.5 in | Wt 220.0 lb

## 2019-03-16 DIAGNOSIS — I428 Other cardiomyopathies: Secondary | ICD-10-CM

## 2019-03-16 DIAGNOSIS — Z024 Encounter for examination for driving license: Secondary | ICD-10-CM

## 2019-03-16 DIAGNOSIS — I480 Paroxysmal atrial fibrillation: Secondary | ICD-10-CM | POA: Diagnosis not present

## 2019-03-16 DIAGNOSIS — I2510 Atherosclerotic heart disease of native coronary artery without angina pectoris: Secondary | ICD-10-CM

## 2019-03-16 DIAGNOSIS — I5022 Chronic systolic (congestive) heart failure: Secondary | ICD-10-CM

## 2019-03-16 NOTE — Telephone Encounter (Signed)
Call to patient to review AVS from video encounter today. GXT and lab orders placed. I gave verbal instructions and sent AVS to pt via mychart.   Pt verbalized understanding.

## 2019-03-16 NOTE — Telephone Encounter (Signed)
Call to patient to make him aware that GXT in hospital are currently on hold d/t CV19 restrictions.   I made Ignacia Bayley, NP aware and he suggested that pt call DOT to ask for extension.   I relayed this information to patient. He is agreeable to POC.   Advised pt to call for any further questions or concerns.

## 2019-03-16 NOTE — Patient Instructions (Signed)
It was a pleasure to speak with you on the phone today! Thank you for allowing [place] to continue taking care of your [facility] needs during this time.   Feel free to call as needed for questions and concerns related to your cardiac needs.   Medication Instructions:  Your physician recommends that you continue on your current medications as directed. Please refer to the Current Medication list given to you today.  If you need a refill on your cardiac medications before your next appointment, please call your pharmacy.   Lab work: Your physician recommends that you return for lab work in: 1-2 weeks (on the day of your stress test) at the medical mall. You will need to be fasting.  No appt is needed. Hours are M-F 7AM- 6 PM.  If you have labs (blood work) drawn today and your tests are completely normal, you will receive your results only by: Marland Kitchen MyChart Message (if you have MyChart) OR . A paper copy in the mail If you have any lab test that is abnormal or we need to change your treatment, we will call you to review the results.  Testing/Procedures: 1- Treadmill Stress Test Date:____in the next 1-2 weeks____ Time:________ Location:____Medical Mall________ Call: [phone] to schedule in the next 1-2 weeks.   Your physician has requested that you have an exercise tolerance test. For further information please visit [email or web address]. Please also follow instruction sheet, as given.  Exercise Stress Test   An exercise stress test is a test to check how your heart works during exercise and checks for coronary artery disease.  Your heart rate will be watched while you are resting and while you are exercising.   Patches (electrodes) will be put on your chest. Do not put lotions, powders, creams, or oils on your chest before the test. Wires will be connected to the patches. The wires will send signals to a machine to record your heartbeat. Wear comfortable shoes and clothing.  Follow  instructions from your doctor about what you cannot eat or drink before the test.  Before the procedure  Follow instructions from your doctor about what you cannot eat or drink. ? Do not have any drinks or foods that have caffeine in them for 24 hours before the test, or as told by your doctor. This includes coffee, tea (even decaf tea), sodas, chocolate, and cocoa. ? Hold beta-blocker medicines for night before and the morning of. Take all other BP meds that aren't Bbs.   If you use an inhaler, bring it with you to the test.  Do not use any products that have nicotine or tobacco in them, such as cigarettes and e-cigarettes. Stop using them at least 4 hours before the test. If you need help quitting, ask your doctor.    Follow-Up: At [facility], you and your health needs are our priority.  As part of our continuing mission to provide you with exceptional heart care, we have created designated Provider Care Teams.  These Care Teams include your primary Cardiologist (physician) and Advanced Practice Providers (APPs -  Physician Assistants and Nurse Practitioners) who all work together to provide you with the care you need, when you need it. You will need a follow up appointment in 1 years.  Please call our office 2 months in advance to schedule this appointment.  Please see Kathlyn Sacramento, MD.

## 2019-03-16 NOTE — Progress Notes (Signed)
Virtual Visit via Video Note   This visit type was conducted due to national recommendations for restrictions regarding the COVID-19 Pandemic (e.g. social distancing) in an effort to limit this patient's exposure and mitigate transmission in our community.  Due to his co-morbid illnesses, this patient is at least at moderate risk for complications without adequate follow up.  This format is felt to be most appropriate for this patient at this time.  All issues noted in this document were discussed and addressed.  A limited physical exam was performed with this format.  Please refer to the patient's chart for his consent to telehealth for [facility]. Evaluation Performed:  Follow-up visit  This visit type was conducted due to national recommendations for restrictions regarding the COVID-19 Pandemic (e.g. social distancing).  This format is felt to be most appropriate for this patient at this time.  All issues noted in this document were discussed and addressed.  No physical exam was performed (except for noted visual exam findings with Video Visits).  Please refer to the patient's chart (MyChart message for video visits and phone note for telephone visits) for the patient's consent to telehealth for [facility]. _____________   Date:  03/16/2019   Patient ID:  Walter Banks, DOB 1960/10/14, MRN [id number] Patient Location:  [address] [place] [id number]  Provider location[facility]   [facility]  Primary Care Provider:  Lorre MunroeBaity, Regina W, NP Primary Cardiologist:  Lorine BearsMuhammad Arida, MD  Chief Complaint    1 year follow-up s/p DCCV April 2017 and nonischemic cardiomyopathy LV recovery from 20-25% to 55-60%.  Past Medical History    Past Medical History:  Diagnosis Date  . Chronic systolic CHF (congestive heart failure) (HCC)    a. 12/08/2015 Echo: EF of 20-25% w/ diffuse hypokinesis, severely dilated LA and RA, moderate MR and TR, PA peak pressure of 50 mmHg; b. 03/2016 Echo: EF  50-55%, no rwma, mildly dil LA, nl RV fxn; c. 04/2018 Echo: EF 55-60%, mild LVH. Triv AI. Mildly dil Ao arch. Mildly dil LA/RV/RA. PASP 30mmHg.  Marland Kitchen. H. pylori infection   . History of pneumonia   . Mitral regurgitation    a. 12/2015 Echo: Mod MR;  b. 03/2016 Echo: No MR (EF normalized).  Marland Kitchen. NICM (nonischemic cardiomyopathy) (HCC)    a. cardiac cath 12/10/15: pLAD 40%, o/w no stenosis, severely elevated right heart pressures;  b. 03/2016 Echo: EF 50-55%; c. 04/2018 Echo: EF 55-60%.  Marland Kitchen. PAF (paroxysmal atrial fibrillation) (HCC)    a. Dx 12/2015;  b. s/p DCCV 01/2016;  c. on Eliquis [CHADS2VASc = 1 (CHF)]  . VT (ventricular tachycardia) (HCC)    a. 38 beat run during admission 12/2015-->associated with flushing.   Past Surgical History:  Procedure Laterality Date  . CARDIAC CATHETERIZATION N/A 12/10/2015   Procedure: Right and Left Heart Cath;  Surgeon: Antonieta Ibaimothy J Gollan, MD;  Location: [facility] INVASIVE CV LAB;  Service: Cardiovascular;  Laterality: N/A;  . ELECTROPHYSIOLOGIC STUDY N/A 01/10/2016   Procedure: CARDIOVERSION;  Surgeon: Iran OuchMuhammad A Arida, MD;  Location: [facility] ORS;  Service: Cardiovascular;  Laterality: N/A;  . WRIST SURGERY Left 2013    Allergies  No Known Allergies  History of Present Illness    Walter Banks is a 58 y.o. male who presents via audio/video conferencing for a telehealth visit today.  PMH: Nonischemic cardiomyopathy D/T persistent atrial fibrillation 01/2016, DCCV 4/17, chronic systolic heart failure, HTN, A. fib RVR, nonobstructive coronary artery disease (OST LAD to Prox LAD  40%), and obesity.  Status post DCCV 4/17, LVEF improved from 20-25% to 50-55%.  Patient was last seen 03/2017 and was doing well.  ETT in 2018 was normal.  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  At his last visit 04/2018, he  admitted to enjoying craft beer,2x per week, and consuming 5 or 6 pints during each visit.  His consumption of craft beer had contributed to his weight gain  (206lbs - 230.5lbs).  Today he states he feels well.  He has resumed all of his normal activities at work.  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  He states he is fairly sedentary when he gets home in the evenings but does try to maintain a heart healthy diet.  He has reduced his alcohol intake but states he does have 2 days off per week and enjoys 3-4 craft beers on those nights.  He denies chest pain, shortness of breath, dizziness, hematuria, melena, hemoptysis, palpitations, orthopnea and PND.   The patient does not have symptoms concerning for COVID-19 infection (fever, chills, cough, or new shortness of breath).   Home Medications    Prior to Admission medications   Medication Sig Start Date End Date Taking? Authorizing Provider  atorvastatin (LIPITOR) 20 MG tablet Take 1 tablet by mouth once daily 02/28/19  Yes Arida, Chelsea Aus, MD  ELIQUIS 5 MG TABS tablet TAKE 1 TABLET BY MOUTH TWICE DAILY 11/15/18  Yes Iran Ouch, MD  metoprolol succinate (TOPROL-XL) 50 MG 24 hr tablet TAKE 1 TABLET BY MOUTH ONCE DAILY. TAKE WITH OR IMMEDIATELY FOLLOWING A MEAL 02/28/19  Yes Iran Ouch, MD  multivitamin (ONE-A-DAY MEN'S) TABS tablet Take 1 tablet by mouth daily.   Yes [provider]  sacubitril-valsartan (ENTRESTO) 24-26 MG Take 1 tablet by mouth 2 (two) times daily. 12/17/17  Yes Iran Ouch, MD  spironolactone (ALDACTONE) 25 MG tablet Take 1 tablet by mouth once daily 02/28/19  Yes Iran Ouch, MD    Review of Systems     All other systems reviewed and are otherwise negative except as noted above.  Physical Exam    Vital Signs:  BP 133/84 (BP Location: Left Arm, Patient Position: Sitting, Cuff Size: Normal)   Pulse 64   Ht 6' 1.5" (1.867 m)   Wt 220 lb (99.8 kg)   BMI 28.63 kg/m    No acute distress, alert and oriented x3, unlabored breathing, and asking appropriate questions.   Accessory Clinical Findings    None  Assessment & Plan     1.  Nonischemic cardiomyopathy-no dyspnea on exertion or CP, LVEF 55 to 60% (7/19). Continue Entresto 24-26 mg tablet twice daily Continue spironolactone 25 mg tablet daily Continue beta-blocker   2.  Paroxysmal atrial fibrillation- DCCV (4/17), no activity intolerance Continue Eliquis 5 mg tablet daily Continue beta-blocker  3.  Nonobstructive coronary artery disease- no chest pain Continue atorvastatin 20 mg tablet daily Continue beta-blocker  4.  Alcohol use- has decreased to 3 to 4 pints 2 days/week Education provided about alcohol use and cardiomyopathy as well as atrial fibrillation.  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 Ordered ETT, CBC, CMP, and fasting lipid panel.  In the next week.    Disposition: Follow-up with Dr. Kirke Corin in 1 year.  COVID-19 Education: The signs and symptoms of COVID-19 were discussed with the patient and how to seek care for testing (follow up with PCP or arrange E-visit).  The importance of social distancing was discussed today.  Patient Risk:   After full review of this patient's history and clinical status, I feel that he is at least moderate risk for cardiac complications at this time, thus necessitating a telehealth visit sooner than our first available in office visit.  Time:   Today, I have spent 20 minutes with the patient with telehealth technology discussing medical history, symptoms, and management plan.     Deberah Pelton, NP 03/16/2019, 9:17 AM

## 2019-05-12 ENCOUNTER — Telehealth: Payer: Self-pay | Admitting: Cardiovascular Disease

## 2019-05-12 NOTE — Telephone Encounter (Signed)
Patient calling to check status of letter says   " cleared, from a cardiac standpoint, to return to work with no restrictions"  Please fax to [facility] verified fax [phone].

## 2019-05-12 NOTE — Telephone Encounter (Signed)
I do not think we will be able to do GXT anytime soon. This patient has no previous coronary artery disease and his ejection fraction has normalized last year.  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

## 2019-05-12 NOTE — Telephone Encounter (Signed)
Patient need a letter stating it is ok for him to drive.

## 2019-05-12 NOTE — Telephone Encounter (Signed)
To Dr. Eilleen Kempf, NP to review. 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   Iva, RN spoke with him and advised that GXT are currently on hold in the hospital, as they still are, and advised that he call the DOT for an extension.  Will forward to Dr. Eilleen Kempf, NP to review as I am unsure he can be cleared without his GXT being done.

## 2019-05-17 NOTE — Telephone Encounter (Signed)
Patient calling in today regarding his DOT issues. Patients doctors office where physical was complete is now needing documentation clearing patient to be able to drive. Patient was seen at [facility] in [place]. Referenced Meriam Sprague as helping with this last week.

## 2019-05-17 NOTE — Telephone Encounter (Addendum)
Spoke with the patient. Adv the patient that Dr.Arida is agreeable with drafting a letter for DOT clearing him from a cardiac standpoint. Adv the pt that Dr.Arida will be in the office tomorrow (letter placed in MA in box). Adv the pt that once the letter is signed by Dr. Fletcher Anon we can fax it to the fax # he requested below. The patient was very appreciative for the assistance.

## 2019-05-18 NOTE — Telephone Encounter (Signed)
Letter faxed to the fax number the patient provided Attn: [facility].

## 2019-05-29 ENCOUNTER — Other Ambulatory Visit: Payer: Self-pay | Admitting: Cardiovascular Disease

## 2019-05-29 NOTE — Telephone Encounter (Signed)
Please review for refill. Thanks!  

## 2019-05-29 NOTE — Telephone Encounter (Signed)
Pt's age 58, wt 99.8 kg, SCr 1.02, CrCl 111, last appt w/ Odie Sera, NP 03/16/19.

## 2020-03-04 ENCOUNTER — Other Ambulatory Visit: Payer: Self-pay | Admitting: Cardiovascular Disease

## 2020-03-19 ENCOUNTER — Other Ambulatory Visit: Payer: Self-pay

## 2020-03-19 ENCOUNTER — Encounter: Payer: Self-pay | Admitting: Family

## 2020-03-19 ENCOUNTER — Ambulatory Visit: Payer: BC Managed Care – PPO | Admitting: Family

## 2020-03-19 VITALS — BP 110/90 | HR 86 | Ht 74.0 in | Wt 248.0 lb

## 2020-03-19 DIAGNOSIS — I2510 Atherosclerotic heart disease of native coronary artery without angina pectoris: Secondary | ICD-10-CM

## 2020-03-19 DIAGNOSIS — I480 Paroxysmal atrial fibrillation: Secondary | ICD-10-CM

## 2020-03-19 DIAGNOSIS — E782 Mixed hyperlipidemia: Secondary | ICD-10-CM | POA: Diagnosis not present

## 2020-03-19 DIAGNOSIS — R0609 Other forms of dyspnea: Secondary | ICD-10-CM | POA: Diagnosis not present

## 2020-03-19 MED ORDER — FUROSEMIDE 20 MG PO TABS: 20.0000 mg | ORAL_TABLET | ORAL | 3 refills | Status: DC

## 2020-03-19 NOTE — H&P (View-Only) (Signed)
Office Visit    Patient Name: Walter Banks Date of Encounter: 03/19/2020  Primary Care Provider:  Lorre Munroe, NP Primary Cardiologist:  Lorine Bears, MD Electrophysiologist:  None   Chief Complaint    Walter Banks is a 59 y.o. male with a hx of nonischemic cardiomyopathy in the setting persistent atrial fibrillation s/p DCCV 01/2016, chronic systolic heart failure, hypertension, atrial fibrillation with RVR, nonobstructive coronary disease (last LAD proximal LAD 40%), obesity presents today for follow-up of atrial fibrillation and heart failure.  Past Medical History    Past Medical History:  Diagnosis Date   Chronic systolic CHF (congestive heart failure) (HCC)    a. 12/08/2015 Echo: EF of 20-25% w/ diffuse hypokinesis, severely dilated LA and RA, moderate MR and TR, PA peak pressure of 50 mmHg; b. 03/2016 Echo: EF 50-55%, no rwma, mildly dil LA, nl RV fxn; c. 04/2018 Echo: EF 55-60%, mild LVH. Triv AI. Mildly dil Ao arch. Mildly dil LA/RV/RA. PASP .   H. pylori infection    History of pneumonia    Mitral regurgitation    a. 12/2015 Echo: Mod MR;  b. 03/2016 Echo: No MR (EF normalized).   NICM (nonischemic cardiomyopathy) (HCC)    a. cardiac cath 12/10/15: pLAD 40%, o/w no stenosis, severely elevated right heart pressures;  b. 03/2016 Echo: EF 50-55%; c. 04/2018 Echo: EF 55-60%.   PAF (paroxysmal atrial fibrillation) (HCC)    a. Dx 12/2015;  b. s/p DCCV 01/2016;  c. on Eliquis [CHADS2VASc = 1 (CHF)]   VT (ventricular tachycardia) (HCC)    a. 38 beat run during admission 12/2015-->associated with flushing.   Past Surgical History:  Procedure Laterality Date   CARDIAC CATHETERIZATION N/A 12/10/2015   Procedure: Right and Left Heart Cath;  Surgeon: Antonieta Iba, MD;  Location: [facility] INVASIVE CV LAB;  Service: Cardiovascular;  Laterality: N/A;   ELECTROPHYSIOLOGIC STUDY N/A 01/10/2016   Procedure: CARDIOVERSION;  Surgeon: Iran Ouch, MD;  Location: [facility]  ORS;  Service: Cardiovascular;  Laterality: N/A;   WRIST SURGERY Left 2013    Allergies  No Known Allergies  History of Present Illness    Walter Banks is a 59 y.o. male with a hx of nonischemic cardiomyopathy in the setting persistent atrial fibrillation s/p DCCV 01/2016, chronic systolic heart failure, hypertension, atrial fibrillation with RVR, nonobstructive coronary disease (last LAD proximal LAD 40%), obesity.  He was last seen 03/16/2019 via telemedicine by Edd Fabian, NP.  Echo 12/2015 in the setting of atrial fibrillation with LVEF 20-25%.  Subsequent echocardiogram 03/2016 with LVEF 50-55%, normal wall motion, ascending aorta mildly dilated, no significant valvular abnormalities.  ETT in 2017 and 2018 were normal.  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  Echo 04/2018 LVEF 55 to 60%, normal wall motion, trivial AI, ascending aorta borderline dilated, aortic arch mildly dilated, LA mildly dilated, RV mildly dilated.  Enjoys working on his classic car in his spare time. 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  Reports this has been more difficult over the last 6 months  Reports constellation of worsening symptoms over the last six months. Endorses fatigue and shortness of breath both at rest and with activity.  Notes increased perspiration. Reports palpitations and sensation of heart racing.  Feels like he is having an "adrenaline" rush. Reports orthopnea.  He denies PND  Eats out  most of the time but does try to avoid friend foods.   EKGs/Labs/Other Studies Reviewed:   The following studies were reviewed today:  Echo 04/2018 Study Conclusions   - Left ventricle: The cavity size was mildly dilated. Wall    thickness was increased in a pattern of mild LVH. Systolic    function was normal. The estimated ejection fraction was in the    range of 55% to 60%. Wall  motion was normal; there were no    regional wall motion abnormalities. Left ventricular diastolic    function parameters were normal.  - Aortic valve: There was trivial regurgitation.  - Ascending aorta: The ascending aorta was borderline dilated.  - Aortic arch: The aortic arch was mildly dilated.  - Left atrium: The atrium was mildly dilated.  - Right ventricle: The cavity size was mildly dilated. Wall    thickness was normal. Systolic function was normal.  - Right atrium: The atrium was mildly dilated.  - Pulmonary arteries: Systolic pressure was at the upper limits of    normal, estimated to be 30 mm Hg.   EKG:  EKG is ordered today.  The ekg ordered today demonstrates recurrent atrial fibrillation rate 86 bpm.   Recent Labs: No results found for requested labs within last 8760 hours.  Recent Lipid Panel    Component Value Date/Time   CHOL 173 04/11/2018 1450   TRIG 79 04/11/2018 1450   HDL 52 04/11/2018 1450   CHOLHDL 3.3 04/11/2018 1450   CHOLHDL 5.7 12/10/2015 0157   VLDL 16 12/10/2015 0157   LDLCALC 105 (H) 04/11/2018 1450   LDLDIRECT 110 (H) 04/11/2018 1450   Home Medications   Current Meds  Medication Sig   atorvastatin (LIPITOR) 20 MG tablet Take 1 tablet by mouth once daily   ELIQUIS 5 MG TABS tablet Take 1 tablet by mouth twice daily   ENTRESTO 24-26 MG Take 1 tablet by mouth twice daily   metoprolol succinate (TOPROL-XL) 50 MG 24 hr tablet TAKE 1 TABLET BY MOUTH ONCE DAILY WITH  OR  IMMEDIATELY  FOLLOWING  A  MEAL   multivitamin (ONE-A-DAY MEN'S) TABS tablet Take 1 tablet by mouth daily.   spironolactone (ALDACTONE) 25 MG tablet Take 1 tablet by mouth once daily    Review of Systems    Review of Systems  Constitutional: Positive for malaise/fatigue. Negative for chills and fever.  Cardiovascular: Positive for dyspnea on exertion, irregular heartbeat, leg swelling and orthopnea. Negative for chest pain, near-syncope, palpitations and syncope.    Respiratory: Positive for shortness of breath. Negative for cough and wheezing.   Gastrointestinal: Negative for melena, nausea and vomiting.  Genitourinary: Negative for hematuria.  Neurological: Negative for dizziness, light-headedness and weakness.   All other systems reviewed and are otherwise negative except as noted above.  Physical Exam    VS:  BP 110/90 (BP Location: Left Arm, Patient Position: Sitting, Cuff Size: Normal)    Pulse 86    Ht 6\' 2"  (1.88 m)    Wt 248 lb (112.5 kg)    SpO2 97%    BMI 31.84 kg/m  , BMI Body mass index is 31.84 kg/m. GEN: Well nourished, well developed, in no acute distress. HEENT: normal. Neck: Supple, no JVD, carotid bruits, or masses. Cardiac: irregularly irregular, no murmurs, rubs, or gallops. No clubbing, cyanosis, edema.  Radials/DP/PT 2+ and equal bilaterally.  Respiratory:  Respirations regular and unlabored, clear to auscultation bilaterally. GI: Soft, nontender, nondistended. MS: No deformity or atrophy.  Skin: Warm and dry, no rash. Neuro:  Strength and sensation are intact. Psych: Normal affect.  Assessment & Plan    1. NICM/chronic systolic heart failure - Reports dyspnea on exertion, fatigue, LE edema, orthopnea x6 months. Likely heart failure symptoms exacerbated by recurrent atrial fibrillation, as below. Echocardiogram ordered. Start Lasix 20mg  QOD.   2. PAF s/p DCCV 01/2016 -EKG today shows recurrent atrial fibrillation with rate of 86 bpm.  He is symptomatic with dyspnea on exertion, edema, fatigue.  Reports symptoms ongoing for approximately 6 months.  Unclear duration of recurrent atrial fibrillation.  Denies missed doses of Eliquis 5 mg twice daily over the last 3 weeks.  Plan for cardioversion. Risks and benefits of the procedure were discussed and he is agreeable to proceed. Labs today including CMP, CBC, TSH, magnesium. Concern for thyroid abnormality due to reports of increased perspiration.  Echocardiogram ordered to assess  LVEF and for valvular abnormalities.  As his last cardioversion was in 2017 and he has maintained NSR since that time we will defer addition of AAD at this time.  3. Nonobstructive CAD -reports no chest pain, pressure, tightness.  No history of ischemic evaluation at this time.  Continue GDMT including beta-blocker and statin.  No aspirin secondary to chronic anticoagulation with Eliquis.  4. EtOH use - Drinks twice per week and has 4 beers. Encouraged complete cessation.   5. HLD, LDL goal<70 - Continue Atorvastatin. Lipid panel, CMET today for monitoring.   Disposition: Cardioversion 03/26/20. Echo ordered. Follow up in 3 week(s) with Dr. Fletcher Anon or APP   Loel Dubonnet, NP 03/19/2020, 9:24 AM

## 2020-03-19 NOTE — Patient Instructions (Addendum)
Medication Instructions: Start Lasix 20 mg take one tablet every other day on Tuesday, Thursday and Saturday.   *If you need a refill on your cardiac medications before your next appointment, please call your pharmacy*   Lab Work: CMET, Magnesium, TSH, CBC, Lipid panel.   [facility] on Friday, March 22, 2020 between 8 am and noon.   If you have labs (blood work) drawn today and your tests are completely normal, you will receive your results only by: Marland Kitchen MyChart Message (if you have MyChart) OR . A paper copy in the mail If you have any lab test that is abnormal or we need to change your treatment, we will call you to review the results.   Testing/Procedures:   1. You are scheduled for a Cardioversion on March 26, 2020 with Dr. END . [facility] at 6:30 am. (1 hour prior to procedure DIET: Nothing to eat or drink after midnight except a sip of water with medications (see medication instructions below) Medication Instructions: Hold (fluid pill/DM meds) Take Metoprolol Continue your anticoagulant: Eliquis. You will need to continue your anticoagulant after your procedure until you are told by your Provider that it is safe to stop.  For patients receiving anesthesia for TEE and all Cardioversion patients: BMET, CBC within 1 week You must have a responsible person to drive you home and stay in the waiting area during your procedure. Failure to do so could result in cancellation. Bring your insurance cards. *Special Note: Every effort is made to have your procedure done on time. Occasionally there are emergencies that occur at the hospital that may cause delays. Please be patient if a delay does occur.  2. Your physician has requested that you have an echocardiogram. Echocardiography is a painless test that uses sound waves to create images of your heart. It provides your doctor with information about the size and shape of your heart and  how well your heart's chambers and valves are working. This procedure takes approximately one hour. There are no restrictions for this procedure.    Follow-Up: At [facility], you and your health needs are our priority.  As part of our continuing mission to provide you with exceptional heart care, we have created designated Provider Care Teams.  These Care Teams include your primary Cardiologist (physician) and Advanced Practice Providers (APPs -  Physician Assistants and Nurse Practitioners) who all work together to provide you with the care you need, when you need it.  We recommend signing up for the patient portal called "MyChart".  Sign up information is provided on this After Visit Summary.  MyChart is used to connect with patients for Virtual Visits (Telemedicine).  Patients are able to view lab/test results, encounter notes, upcoming appointments, etc.  Non-urgent messages can be sent to your provider as well.   To learn more about what you can do with MyChart, go to [email or web address].    Your next appointment:   3 week(s)  The format for your next appointment:   In Person  Provider:    You may see Lorine Bears, MD or one of the following Advanced Practice Providers on your designated Care Team:    Nicolasa Ducking, NP  Eula Listen, PA-C  Marisue Ivan, PA-C    Other Instructions

## 2020-03-19 NOTE — Progress Notes (Addendum)
° ° °Office Visit  °  °Patient Name: Walter Banks °Date of Encounter: 03/19/2020 ° °Primary Care Provider:  Baity, Regina W, NP °Primary Cardiologist:  Muhammad Arida, MD °Electrophysiologist:  None  ° °Chief Complaint  °  °Walter Banks is a 59 y.o. male with a hx of nonischemic cardiomyopathy in the setting persistent atrial fibrillation s/p DCCV 01/2016, chronic systolic heart failure, hypertension, atrial fibrillation with RVR, nonobstructive coronary disease (last LAD proximal LAD 40%), obesity presents today for follow-up of atrial fibrillation and heart failure. ° °Past Medical History  °  °Past Medical History:  °Diagnosis Date  °• Chronic systolic CHF (congestive heart failure) (HCC)   ° a. 12/08/2015 Echo: EF of 20-25% w/ diffuse hypokinesis, severely dilated LA and RA, moderate MR and TR, PA peak pressure of 50 mmHg; b. 03/2016 Echo: EF 50-55%, no rwma, mildly dil LA, nl RV fxn; c. 04/2018 Echo: EF 55-60%, mild LVH. Triv AI. Mildly dil Ao arch. Mildly dil LA/RV/RA. PASP 30mmHg.  °• H. pylori infection   °• History of pneumonia   °• Mitral regurgitation   ° a. 12/2015 Echo: Mod MR;  b. 03/2016 Echo: No MR (EF normalized).  °• NICM (nonischemic cardiomyopathy) (HCC)   ° a. cardiac cath 12/10/15: pLAD 40%, o/w no stenosis, severely elevated right heart pressures;  b. 03/2016 Echo: EF 50-55%; c. 04/2018 Echo: EF 55-60%.  °• PAF (paroxysmal atrial fibrillation) (HCC)   ° a. Dx 12/2015;  b. s/p DCCV 01/2016;  c. on Eliquis [CHADS2VASc = 1 (CHF)]  °• VT (ventricular tachycardia) (HCC)   ° a. 38 beat run during admission 12/2015-->associated with flushing.  ° °Past Surgical History:  °Procedure Laterality Date  °• CARDIAC CATHETERIZATION N/A 12/10/2015  ° Procedure: Right and Left Heart Cath;  Surgeon: Timothy J Gollan, MD;  Location: [facility] INVASIVE CV LAB;  Service: Cardiovascular;  Laterality: N/A;  °• ELECTROPHYSIOLOGIC STUDY N/A 01/10/2016  ° Procedure: CARDIOVERSION;  Surgeon: Muhammad A Arida, MD;  Location: [facility]  ORS;  Service: Cardiovascular;  Laterality: N/A;  °• WRIST SURGERY Left 2013  ° ° °Allergies ° °No Known Allergies ° °History of Present Illness  °  °Walter Banks is a 59 y.o. male with a hx of nonischemic cardiomyopathy in the setting persistent atrial fibrillation s/p DCCV 01/2016, chronic systolic heart failure, hypertension, atrial fibrillation with RVR, nonobstructive coronary disease (last LAD proximal LAD 40%), obesity.  He was last seen 03/16/2019 via telemedicine by Jesse Cleaver, NP. ° °Echo 12/2015 in the setting of atrial fibrillation with LVEF 20-25%.  Subsequent echocardiogram 03/2016 with LVEF 50-55%, normal wall motion, ascending aorta mildly dilated, no significant valvular abnormalities.  ETT in 2017 and 2018 were normal.  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  Echo 04/2018 LVEF 55 to 60%, normal wall motion, trivial AI, ascending aorta borderline dilated, aortic arch mildly dilated, LA mildly dilated, RV mildly dilated. ° °Enjoys working on his classic car in his spare time. 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  Reports this has been more difficult over the last 6 months ° °Reports constellation of worsening symptoms over the last six months. Endorses fatigue and shortness of breath both at rest and with activity.  Notes increased perspiration. Reports palpitations and sensation of heart racing.  Feels like he is having an "adrenaline" rush. Reports orthopnea.  He denies PND ° °Eats out   most of the time but does try to avoid friend foods.  ° °EKGs/Labs/Other Studies Reviewed:  ° °The following studies were reviewed today: ° °Echo 04/2018 °Study Conclusions  ° °- Left ventricle: The cavity size was mildly dilated. Wall  °  thickness was increased in a pattern of mild LVH. Systolic  °  function was normal. The estimated ejection fraction was in the  °  range of 55% to 60%. Wall  motion was normal; there were no  °  regional wall motion abnormalities. Left ventricular diastolic  °  function parameters were normal.  °- Aortic valve: There was trivial regurgitation.  °- Ascending aorta: The ascending aorta was borderline dilated.  °- Aortic arch: The aortic arch was mildly dilated.  °- Left atrium: The atrium was mildly dilated.  °- Right ventricle: The cavity size was mildly dilated. Wall  °  thickness was normal. Systolic function was normal.  °- Right atrium: The atrium was mildly dilated.  °- Pulmonary arteries: Systolic pressure was at the upper limits of  °  normal, estimated to be 30 mm Hg.  ° °EKG:  EKG is ordered today.  The ekg ordered today demonstrates recurrent atrial fibrillation rate 86 bpm.  ° °Recent Labs: °No results found for requested labs within last 8760 hours.  °Recent Lipid Panel °   °Component Value Date/Time  ° CHOL 173 04/11/2018 1450  ° TRIG 79 04/11/2018 1450  ° HDL 52 04/11/2018 1450  ° CHOLHDL 3.3 04/11/2018 1450  ° CHOLHDL 5.7 12/10/2015 0157  ° VLDL 16 12/10/2015 0157  ° LDLCALC 105 (H) 04/11/2018 1450  ° LDLDIRECT 110 (H) 04/11/2018 1450  ° °Home Medications  ° °Current Meds  °Medication Sig  °• atorvastatin (LIPITOR) 20 MG tablet Take 1 tablet by mouth once daily  °• ELIQUIS 5 MG TABS tablet Take 1 tablet by mouth twice daily  °• ENTRESTO 24-26 MG Take 1 tablet by mouth twice daily  °• metoprolol succinate (TOPROL-XL) 50 MG 24 hr tablet TAKE 1 TABLET BY MOUTH ONCE DAILY WITH  OR  IMMEDIATELY  FOLLOWING  A  MEAL  °• multivitamin (ONE-A-DAY MEN'S) TABS tablet Take 1 tablet by mouth daily.  °• spironolactone (ALDACTONE) 25 MG tablet Take 1 tablet by mouth once daily  °  °Review of Systems  °  °Review of Systems  °Constitutional: Positive for malaise/fatigue. Negative for chills and fever.  °Cardiovascular: Positive for dyspnea on exertion, irregular heartbeat, leg swelling and orthopnea. Negative for chest pain, near-syncope, palpitations and syncope.    °Respiratory: Positive for shortness of breath. Negative for cough and wheezing.   °Gastrointestinal: Negative for melena, nausea and vomiting.  °Genitourinary: Negative for hematuria.  °Neurological: Negative for dizziness, light-headedness and weakness.  ° °All other systems reviewed and are otherwise negative except as noted above. ° °Physical Exam  °  °VS:  BP 110/90 (BP Location: Left Arm, Patient Position: Sitting, Cuff Size: Normal)    Pulse 86    Ht 6' 2" (1.88 m)    Wt 248 lb (112.5 kg)    SpO2 97%    BMI 31.84 kg/m²  , BMI Body mass index is 31.84 kg/m². °GEN: Well nourished, well developed, in no acute distress. °HEENT: normal. °Neck: Supple, no JVD, carotid bruits, or masses. °Cardiac: irregularly irregular, no murmurs, rubs, or gallops. No clubbing, cyanosis, edema.  Radials/DP/PT 2+ and equal bilaterally.  °Respiratory:  Respirations regular and unlabored, clear to auscultation bilaterally. °GI: Soft, nontender, nondistended. °MS: No deformity or atrophy. °  Skin: Warm and dry, no rash. °Neuro:  Strength and sensation are intact. °Psych: Normal affect. ° °Assessment & Plan  °  °1. NICM/chronic systolic heart failure - Reports dyspnea on exertion, fatigue, LE edema, orthopnea x6 months. Likely heart failure symptoms exacerbated by recurrent atrial fibrillation, as below. Echocardiogram ordered. Start Lasix 20mg QOD.  ° °2. PAF s/p DCCV 01/2016 -EKG today shows recurrent atrial fibrillation with rate of 86 bpm.  He is symptomatic with dyspnea on exertion, edema, fatigue.  Reports symptoms ongoing for approximately 6 months.  Unclear duration of recurrent atrial fibrillation.  Denies missed doses of Eliquis 5 mg twice daily over the last 3 weeks.  Plan for cardioversion. Risks and benefits of the procedure were discussed and he is agreeable to proceed. Labs today including CMP, CBC, TSH, magnesium. Concern for thyroid abnormality due to reports of increased perspiration.  Echocardiogram ordered to assess  LVEF and for valvular abnormalities.  As his last cardioversion was in 2017 and he has maintained NSR since that time we will defer addition of AAD at this time. ° °3. Nonobstructive CAD -reports no chest pain, pressure, tightness.  No history of ischemic evaluation at this time.  Continue GDMT including beta-blocker and statin.  No aspirin secondary to chronic anticoagulation with Eliquis. ° °4. EtOH use - Drinks twice per week and has 4 beers. Encouraged complete cessation.  ° °5. HLD, LDL goal<70 - Continue Atorvastatin. Lipid panel, CMET today for monitoring.  ° °Disposition: Cardioversion 03/26/20. Echo ordered. Follow up in 3 week(s) with Dr. Arida or APP  ° °Ardice Boyan S Joshoa Shawler, NP °03/19/2020, 9:24 AM °

## 2020-03-20 ENCOUNTER — Encounter: Payer: Self-pay | Admitting: Family

## 2020-03-20 DIAGNOSIS — I2510 Atherosclerotic heart disease of native coronary artery without angina pectoris: Secondary | ICD-10-CM

## 2020-03-20 LAB — CBC
Hematocrit: 42.7 % (ref 37.5–51.0)
Hemoglobin: 14.6 g/dL (ref 13.0–17.7)
MCH: 31.8 pg (ref 26.6–33.0)
MCHC: 34.2 g/dL (ref 31.5–35.7)
MCV: 93 fL (ref 79–97)
Platelets: 199 10*3/uL (ref 150–450)
RBC: 4.59 x10E6/uL (ref 4.14–5.80)
RDW: 13 % (ref 11.6–15.4)
WBC: 5.8 10*3/uL (ref 3.4–10.8)

## 2020-03-20 LAB — COMPREHENSIVE METABOLIC PANEL
ALT: 28 IU/L (ref 0–44)
AST: 23 IU/L (ref 0–40)
Albumin/Globulin Ratio: 2.1 (ref 1.2–2.2)
Albumin: 4.8 g/dL (ref 3.8–4.9)
Alkaline Phosphatase: 78 IU/L (ref 48–121)
BUN/Creatinine Ratio: 22 — ABNORMAL HIGH (ref 9–20)
BUN: 20 mg/dL (ref 6–24)
Bilirubin Total: 0.5 mg/dL (ref 0.0–1.2)
CO2: 22 mmol/L (ref 20–29)
Calcium: 9 mg/dL (ref 8.7–10.2)
Chloride: 105 mmol/L (ref 96–106)
Creatinine, Ser: 0.92 mg/dL (ref 0.76–1.27)
GFR calc Af Amer: 105 mL/min/{1.73_m2} (ref >59)
GFR calc non Af Amer: 91 mL/min/{1.73_m2} (ref >59)
Globulin, Total: 2.3 g/dL (ref 1.5–4.5)
Glucose: 96 mg/dL (ref 65–99)
Potassium: 4.4 mmol/L (ref 3.5–5.2)
Sodium: 141 mmol/L (ref 134–144)
Total Protein: 7.1 g/dL (ref 6.0–8.5)

## 2020-03-20 LAB — MAGNESIUM: Magnesium: 1.9 mg/dL (ref 1.6–2.3)

## 2020-03-20 LAB — LIPID PANEL
Chol/HDL Ratio: 4.7 ratio (ref 0.0–5.0)
Cholesterol, Total: 192 mg/dL (ref 100–199)
HDL: 41 mg/dL (ref >39)
LDL Chol Calc (NIH): 127 mg/dL — ABNORMAL HIGH (ref 0–99)
Triglycerides: 136 mg/dL (ref 0–149)
VLDL Cholesterol Cal: 24 mg/dL (ref 5–40)

## 2020-03-20 LAB — TSH: TSH: 3.07 u[IU]/mL (ref 0.450–4.500)

## 2020-03-21 MED ORDER — ATORVASTATIN CALCIUM 40 MG PO TABS: 40.0000 mg | ORAL_TABLET | Freq: Every day | ORAL | 3 refills | Status: DC

## 2020-03-21 NOTE — Telephone Encounter (Signed)
-----   Message from Alver Sorrow, NP sent at 03/20/2020  7:44 AM EDT ----- Normal kidney function and electrolytes. Normal thyroid. Normal CBC. Cholesterol panel shows LDL is 127 - above goal of 100 - recommend increasing Atorvastatin to 40mg  daily with repeat lipid/liver in 8 weeks.

## 2020-03-21 NOTE — Telephone Encounter (Signed)
Call to patient to review labs.    Pt verbalized understanding and has no further questions at this time.    Advised pt to call for any further questions or concerns.  All orders placed.

## 2020-03-21 NOTE — Telephone Encounter (Signed)
Attempted to call patient. [facility] 03/21/2020

## 2020-03-22 ENCOUNTER — Other Ambulatory Visit: Payer: Self-pay

## 2020-03-22 ENCOUNTER — Other Ambulatory Visit
Admission: RE | Admit: 2020-03-22 | Discharge: 2020-03-22 | Disposition: A | Discharge status: Home / Self Care | Payer: BC Managed Care – PPO | Source: Ambulatory Visit | Attending: Internal Medicine | Admitting: Internal Medicine

## 2020-03-22 DIAGNOSIS — Z01812 Encounter for preprocedural laboratory examination: Secondary | ICD-10-CM | POA: Insufficient documentation

## 2020-03-22 DIAGNOSIS — Z20822 Contact with and (suspected) exposure to covid-19: Secondary | ICD-10-CM | POA: Diagnosis not present

## 2020-03-22 LAB — SARS CORONAVIRUS 2 (TAT 6-24 HRS): SARS Coronavirus 2: NEGATIVE

## 2020-03-26 ENCOUNTER — Ambulatory Visit
Admission: RE | Admit: 2020-03-26 | Discharge: 2020-03-26 | Disposition: A | Discharge status: Home / Self Care | Payer: BC Managed Care – PPO | Attending: Internal Medicine | Admitting: Internal Medicine

## 2020-03-26 ENCOUNTER — Ambulatory Visit: Payer: BC Managed Care – PPO | Admitting: Anesthesiology

## 2020-03-26 ENCOUNTER — Encounter
Admission: RE | Disposition: A | Discharge status: Home / Self Care | Payer: BC Managed Care – PPO | Source: Home / Self Care | Attending: Internal Medicine

## 2020-03-26 DIAGNOSIS — I340 Nonrheumatic mitral (valve) insufficiency: Secondary | ICD-10-CM | POA: Insufficient documentation

## 2020-03-26 DIAGNOSIS — R0602 Shortness of breath: Secondary | ICD-10-CM | POA: Insufficient documentation

## 2020-03-26 DIAGNOSIS — R0601 Orthopnea: Secondary | ICD-10-CM | POA: Insufficient documentation

## 2020-03-26 DIAGNOSIS — Z79899 Other long term (current) drug therapy: Secondary | ICD-10-CM | POA: Diagnosis not present

## 2020-03-26 DIAGNOSIS — I2510 Atherosclerotic heart disease of native coronary artery without angina pectoris: Secondary | ICD-10-CM | POA: Diagnosis not present

## 2020-03-26 DIAGNOSIS — I480 Paroxysmal atrial fibrillation: Secondary | ICD-10-CM | POA: Insufficient documentation

## 2020-03-26 DIAGNOSIS — E669 Obesity, unspecified: Secondary | ICD-10-CM | POA: Diagnosis not present

## 2020-03-26 DIAGNOSIS — E785 Hyperlipidemia, unspecified: Secondary | ICD-10-CM | POA: Insufficient documentation

## 2020-03-26 DIAGNOSIS — I4819 Other persistent atrial fibrillation: Secondary | ICD-10-CM

## 2020-03-26 DIAGNOSIS — I110 Hypertensive heart disease with heart failure: Secondary | ICD-10-CM | POA: Diagnosis not present

## 2020-03-26 DIAGNOSIS — I428 Other cardiomyopathies: Secondary | ICD-10-CM | POA: Diagnosis not present

## 2020-03-26 DIAGNOSIS — Z7901 Long term (current) use of anticoagulants: Secondary | ICD-10-CM | POA: Insufficient documentation

## 2020-03-26 DIAGNOSIS — Z6831 Body mass index (BMI) 31.0-31.9, adult: Secondary | ICD-10-CM | POA: Insufficient documentation

## 2020-03-26 DIAGNOSIS — I5022 Chronic systolic (congestive) heart failure: Secondary | ICD-10-CM | POA: Diagnosis not present

## 2020-03-26 HISTORY — PX: CARDIOVERSION: SHX1299

## 2020-03-26 SURGERY — CARDIOVERSION
Anesthesia: General

## 2020-03-26 MED ORDER — ONDANSETRON HCL 4 MG/2ML IJ SOLN: 4.0000 mg | Freq: Once | INTRAMUSCULAR | Status: DC | PRN

## 2020-03-26 MED ORDER — SODIUM CHLORIDE 0.9 % IV SOLN
INTRAVENOUS | Status: DC | PRN
Start: 2020-03-26 — End: 2020-03-26

## 2020-03-26 MED ORDER — PROPOFOL 10 MG/ML IV BOLUS
INTRAVENOUS | Status: DC | PRN
  Administered 2020-03-26: 20 mg via INTRAVENOUS
  Administered 2020-03-26 (×2): 30 mg via INTRAVENOUS

## 2020-03-26 MED ORDER — PROPOFOL 10 MG/ML IV BOLUS
INTRAVENOUS | Status: AC
  Filled 2020-03-26: qty 20

## 2020-03-26 MED ORDER — EPHEDRINE SULFATE 50 MG/ML IJ SOLN
INTRAMUSCULAR | Status: DC | PRN
  Administered 2020-03-26: 5 mg via INTRAVENOUS

## 2020-03-26 NOTE — Interval H&P Note (Signed)
History and Physical Interval Note:  03/26/2020 7:10 AM  Walter Banks  has presented today for surgery, with the diagnosis of atrial fibrillation.  The various methods of treatment have been discussed with the patient and family. After consideration of risks, benefits and other options for treatment, the patient has consented to  Procedure(s): CARDIOVERSION (N/A) as a surgical intervention.  The patient's history has been reviewed, patient examined, no change in status, stable for surgery.  I have reviewed the patient's chart and labs.  Questions were answered to the patient's satisfaction.     [name]

## 2020-03-26 NOTE — Anesthesia Preprocedure Evaluation (Signed)
Anesthesia Evaluation  Patient identified by MRN, date of birth, ID band Patient awake    Reviewed: Allergy & Precautions, NPO status , Patient's Chart, lab work & pertinent test results, reviewed documented beta blocker date and time   History of Anesthesia Complications Negative for: history of anesthetic complications  Airway Mallampati: III  TM Distance: >3 FB     Dental  (+) Chipped   Pulmonary neg sleep apnea, pneumonia, resolved, neg COPD, Patient abstained from smoking.Not current smoker,    breath sounds clear to auscultation       Cardiovascular Exercise Tolerance: Good METShypertension, Pt. on medications and Pt. on home beta blockers (-) CAD, (-) Past MI and (-) CHF + dysrhythmias Atrial Fibrillation  Rhythm:Irregular Rate:Normal  Stress test 2019 normal.  TTE 2019: - Left ventricle: The cavity size was mildly dilated. Wall  thickness was increased in a pattern of mild LVH. Systolic  function was normal. The estimated ejection fraction was in the  range of 55% to 60%. Wall motion was normal; there were no  regional wall motion abnormalities. Left ventricular diastolic  function parameters were normal.  - Aortic valve: There was trivial regurgitation.  - Ascending aorta: The ascending aorta was borderline dilated.  - Aortic arch: The aortic arch was mildly dilated.  - Left atrium: The atrium was mildly dilated.  - Right ventricle: The cavity size was mildly dilated. Wall  thickness was normal. Systolic function was normal.  - Right atrium: The atrium was mildly dilated.  - Pulmonary arteries: Systolic pressure was at the upper limits of  normal, estimated to be 30 mm Hg.    Neuro/Psych negative neurological ROS  negative psych ROS   GI/Hepatic neg GERD  ,(+)     (-) substance abuse  ,   Endo/Other  neg diabetes  Renal/GU negative Renal ROS     Musculoskeletal   Abdominal   Peds   Hematology   Anesthesia Other Findings   Reproductive/Obstetrics                             Anesthesia Physical  Anesthesia Plan  ASA: III  Anesthesia Plan: General   Post-op Pain Management:    Induction: Intravenous  PONV Risk Score and Plan: 2 and Ondansetron, Propofol infusion and TIVA  Airway Management Planned: Nasal Cannula  Additional Equipment: None  Intra-op Plan:   Post-operative Plan:   Informed Consent: I have reviewed the patients History and Physical, chart, labs and discussed the procedure including the risks, benefits and alternatives for the proposed anesthesia with the patient or authorized representative who has indicated his/her understanding and acceptance.     Dental advisory given  Plan Discussed with: CRNA  Anesthesia Plan Comments: (Discussed risks of anesthesia with patient, including possibility of difficulty with spontaneous ventilation under anesthesia necessitating airway intervention, PONV, and rare risks such as cardiac or respiratory or neurological events. Patient understands.)        Anesthesia Quick Evaluation

## 2020-03-26 NOTE — Transfer of Care (Signed)
Immediate Anesthesia Transfer of Care Note  Patient: Walter Banks  Procedure(s) Performed: CARDIOVERSION (N/A )  Patient Location: PACU  Anesthesia Type:General  Level of Consciousness: awake, alert  and oriented  Airway & Oxygen Therapy: Patient Spontanous Breathing and Patient connected to nasal cannula oxygen  Post-op Assessment: Report given to RN and Post -op Vital signs reviewed and stable  Post vital signs: Reviewed and stable  Last Vitals:  Vitals Value Taken Time  BP 108/87 03/26/20 0739  Temp    Pulse 58 03/26/20 0741  Resp 19 03/26/20 0741  SpO2 98 % 03/26/20 0741  Vitals shown include unvalidated device data.  Last Pain:  Vitals:   03/26/20 0650  TempSrc: Oral         Complications: No complications documented.

## 2020-03-26 NOTE — Anesthesia Postprocedure Evaluation (Signed)
Anesthesia Post Note  Patient: Walter Banks  Procedure(s) Performed: CARDIOVERSION (N/A )  Patient location during evaluation: PACU Anesthesia Type: General Level of consciousness: awake and alert Pain management: pain level controlled Vital Signs Assessment: post-procedure vital signs reviewed and stable Respiratory status: spontaneous breathing, nonlabored ventilation, respiratory function stable and patient connected to nasal cannula oxygen Cardiovascular status: blood pressure returned to baseline and stable Postop Assessment: no apparent nausea or vomiting Anesthetic complications: no   No complications documented.   Last Vitals:  Vitals:   03/26/20 0739 03/26/20 0740  BP: 108/87   Pulse: 61 60  Resp: (!) 21 (!) 21  Temp:    SpO2: 99% 99%    Last Pain:  Vitals:   03/26/20 0650  TempSrc: Oral                 Corinda Gubler

## 2020-03-26 NOTE — CV Procedure (Signed)
    Cardioversion Note  JAMESON TORMEY [id number] Feb 24, 1961  Procedure: DC Cardioversion Indications: Atrial fibrillation  Procedure Details Consent: Obtained Time Out: Verified patient identification, verified procedure, site/side was marked, verified correct patient position, special equipment/implants available, Radiology Safety Procedures followed,  medications/allergies/relevent history reviewed, required imaging and test results available.  Performed  The patient has been on adequate anticoagulation.  The patient received IV propofol by anesthesia for sedation.  Synchronous cardioversion was performed at 200 joules x 1.  The cardioversion was successful with restoration of sinus rhythm.  Complications: No apparent complications Patient did tolerate procedure well.  Yvonne Kendall., MD 03/26/2020, 7:43 AM

## 2020-03-27 ENCOUNTER — Encounter: Payer: Self-pay | Admitting: Internal Medicine

## 2020-03-28 ENCOUNTER — Telehealth: Payer: Self-pay | Admitting: Cardiovascular Disease

## 2020-03-28 NOTE — Telephone Encounter (Signed)
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 Patient is scheduled for an echo in July and a follow up but patient is wanting to know if there is anything else he will need and wanting to see if we can do what is needed before august. Patient states he may need a stress test.   Please advise when able

## 2020-03-28 NOTE — Telephone Encounter (Signed)
Echocardiogram is likely enough for now.  If ejection fraction comes above 40% as expected, he will not require a stress test given that he had no significant blockages in the past.

## 2020-03-28 NOTE — Telephone Encounter (Signed)
Spoke with the patient and made him aware of Dr. Arida's response and recommendation. Patient verbalized understanding and voiced appreciation for the call back. 

## 2020-03-28 NOTE — Telephone Encounter (Addendum)
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   Message fwd to Dr. Kirke Corin to advise

## 2020-04-09 ENCOUNTER — Other Ambulatory Visit: Payer: Self-pay

## 2020-04-09 ENCOUNTER — Encounter: Payer: Self-pay | Admitting: Family

## 2020-04-09 ENCOUNTER — Ambulatory Visit: Payer: BC Managed Care – PPO | Admitting: Family

## 2020-04-09 VITALS — BP 136/78 | HR 57 | Ht 74.0 in | Wt 249.4 lb

## 2020-04-09 DIAGNOSIS — I480 Paroxysmal atrial fibrillation: Secondary | ICD-10-CM

## 2020-04-09 DIAGNOSIS — Z79899 Other long term (current) drug therapy: Secondary | ICD-10-CM

## 2020-04-09 DIAGNOSIS — E785 Hyperlipidemia, unspecified: Secondary | ICD-10-CM | POA: Diagnosis not present

## 2020-04-09 DIAGNOSIS — Z7901 Long term (current) use of anticoagulants: Secondary | ICD-10-CM

## 2020-04-09 DIAGNOSIS — I428 Other cardiomyopathies: Secondary | ICD-10-CM

## 2020-04-09 DIAGNOSIS — I5022 Chronic systolic (congestive) heart failure: Secondary | ICD-10-CM

## 2020-04-09 NOTE — Progress Notes (Addendum)
Office Visit    Patient Name: Walter Banks Date of Encounter: 04/09/2020  Primary Care Provider:  Lorre Munroe, NP Primary Cardiologist:  Lorine Bears, MD Electrophysiologist:  None   Chief Complaint    Walter Banks is a 59 y.o. male with a hx of nonischemic cardiomyopathy in the setting persistent atrial fibrillation s/p DCCV 01/2016, chronic systolic heart failure, hypertension, atrial fibrillation with RVR, nonobstructive coronary disease (ostial-proximal LAD 40%), obesity presents today for follow-up after cardioversion  Past Medical History    Past Medical History:  Diagnosis Date  . Chronic systolic CHF (congestive heart failure) (HCC)    a. 12/08/2015 Echo: EF of 20-25% w/ diffuse hypokinesis, severely dilated LA and RA, moderate MR and TR, PA peak pressure of 50 mmHg; b. 03/2016 Echo: EF 50-55%, no rwma, mildly dil LA, nl RV fxn; c. 04/2018 Echo: EF 55-60%, mild LVH. Triv AI. Mildly dil Ao arch. Mildly dil LA/RV/RA. PASP .  Marland Kitchen H. pylori infection   . History of pneumonia   . Mitral regurgitation    a. 12/2015 Echo: Mod MR;  b. 03/2016 Echo: No MR (EF normalized).  Marland Kitchen NICM (nonischemic cardiomyopathy) (HCC)    a. cardiac cath 12/10/15: pLAD 40%, o/w no stenosis, severely elevated right heart pressures;  b. 03/2016 Echo: EF 50-55%; c. 04/2018 Echo: EF 55-60%.  Marland Kitchen PAF (paroxysmal atrial fibrillation) (HCC)    a. Dx 12/2015;  b. s/p DCCV 01/2016;  c. on Eliquis [CHADS2VASc = 1 (CHF)]  . VT (ventricular tachycardia) (HCC)    a. 38 beat run during admission 12/2015-->associated with flushing.   Past Surgical History:  Procedure Laterality Date  . CARDIAC CATHETERIZATION N/A 12/10/2015   Procedure: Right and Left Heart Cath;  Surgeon: Antonieta Iba, MD;  Location: [facility]IVE [facility];  Service: Cardiovascular;  Laterality: N/A;  . CARDIOVERSION N/A 03/26/2020   Procedure: CARDIOVERSION;  Surgeon: Yvonne Kendall, MD;  Location: [facility] ORS;  Service: Cardiovascular;   Laterality: N/A;  . ELECTROPHYSIOLOGIC STUDY N/A 01/10/2016   Procedure: CARDIOVERSION;  Surgeon: Iran Ouch, MD;  Location: [facility] ORS;  Service: Cardiovascular;  Laterality: N/A;  . WRIST SURGERY Left 2013    Allergies  No Known Allergies  History of Present Illness    ALCIDES Banks is a 59 y.o. male with a hx of nonischemic cardiomyopathy in the setting persistent atrial fibrillation s/p DCCV 01/2016, chronic systolic heart failure, hypertension, atrial fibrillation with RVR, nonobstructive coronary disease (last LAD proximal LAD 40%), obesity.  He was last seen for cardioversion 03/26/2020  Echo 12/2015 in the setting of atrial fibrillation with LVEF 20-25%.  Subsequent echocardiogram 03/2016 with LVEF 50-55%, normal wall motion, ascending aorta mildly dilated, no significant valvular abnormalities.  ETT in 2017 and 2018 were normal.  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  Echo 04/2018 LVEF 55 to 60%, normal wall motion, trivial AI, ascending aorta borderline dilated, aortic arch mildly dilated, LA mildly dilated, RV mildly dilated.  Enjoys working on his classic car in his spare time. 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  Reports this has been more difficult over the last 6 months  Seen in clinic 03/19/2020 noting increased dyspnea over the last 6 months, fatigue, palpitations.  Noted to be in recurrent symptomatic atrial fibrillation.  He was recommended for cardioversion and echocardiogram.  Labs 6-16/21 with LDL of  127.  He was recommended to increase his atorvastatin to 40 mg daily.  Underwent cardioversion 03/26/20. Reports feeling well since DCCV. Reports improvement in energy levels and no recurrent fatigue. Shortness of breath has resolved.  Reports very mild lower extremity edema noted by at the end of the day, but does sit most of the day driving a  truck.  Reports no shortness of breath nor dyspnea on exertion. Reports no chest pain, pressure, or tightness. No orthopnea, PND. Reports no palpitations.    EKGs/Labs/Other Studies Reviewed:   The following studies were reviewed today:  Echo 04/2018 Study Conclusions   - Left ventricle: The cavity size was mildly dilated. Wall    thickness was increased in a pattern of mild LVH. Systolic    function was normal. The estimated ejection fraction was in the    range of 55% to 60%. Wall motion was normal; there were no    regional wall motion abnormalities. Left ventricular diastolic    function parameters were normal.  - Aortic valve: There was trivial regurgitation.  - Ascending aorta: The ascending aorta was borderline dilated.  - Aortic arch: The aortic arch was mildly dilated.  - Left atrium: The atrium was mildly dilated.  - Right ventricle: The cavity size was mildly dilated. Wall    thickness was normal. Systolic function was normal.  - Right atrium: The atrium was mildly dilated.  - Pulmonary arteries: Systolic pressure was at the upper limits of    normal, estimated to be 30 mm Hg.   EKG:  EKG is ordered today.  The ekg ordered today demonstrates NSR 60 bpm with no acute ST/T wave changes.  Recent Labs: 03/19/2020: ALT 28; BUN 20; Creatinine, Ser 0.92; Hemoglobin 14.6; Magnesium 1.9; Platelets 199; Potassium 4.4; Sodium 141; TSH 3.070  Recent Lipid Panel    Component Value Date/Time   CHOL 192 03/19/2020 1014   TRIG 136 03/19/2020 1014   HDL 41 03/19/2020 1014   CHOLHDL 4.7 03/19/2020 1014   CHOLHDL 5.7 12/10/2015 0157   VLDL 16 12/10/2015 0157   LDLCALC 127 (H) 03/19/2020 1014   LDLDIRECT 110 (H) 04/11/2018 1450   Home Medications   Current Meds  Medication Sig  . atorvastatin (LIPITOR) 40 MG tablet Take 1 tablet (40 mg total) by mouth daily.  Marland Kitchen ELIQUIS 5 MG TABS tablet Take 1 tablet by mouth twice daily  . ENTRESTO 24-26 MG Take 1 tablet by mouth twice daily  .  furosemide (LASIX) 20 MG tablet Take 1 tablet (20 mg total) by mouth every other day.  . metoprolol succinate (TOPROL-XL) 50 MG 24 hr tablet TAKE 1 TABLET BY MOUTH ONCE DAILY WITH  OR  IMMEDIATELY  FOLLOWING  A  MEAL  . multivitamin (ONE-A-DAY MEN'S) TABS tablet Take 1 tablet by mouth daily.  Marland Kitchen spironolactone (ALDACTONE) 25 MG tablet Take 1 tablet by mouth once daily    Review of Systems   Review of Systems  Constitutional: Negative for chills, fever and malaise/fatigue.  Cardiovascular: Negative for chest pain, dyspnea on exertion, irregular heartbeat, leg swelling, near-syncope, orthopnea, palpitations and syncope.  Respiratory: Negative for cough, shortness of breath and wheezing.   Gastrointestinal: Negative for melena, nausea and vomiting.  Genitourinary: Negative for hematuria.  Neurological: Negative for dizziness, light-headedness and weakness.   All other systems reviewed and are otherwise negative except as noted above.  Physical Exam    VS:  BP 136/78 (BP Location: Left Arm, Patient Position: Sitting, Cuff Size: Normal)  Pulse (!) 57   Ht 6\' 2"  (1.88 m)   Wt 249 lb 6 oz (113.1 kg)   SpO2 98%   BMI 32.02 kg/m  , BMI Body mass index is 32.02 kg/m. GEN: Well nourished, well developed, in no acute distress. HEENT: normal. Neck: Supple, no JVD, carotid bruits, or masses. Cardiac:  RRR, no murmurs, rubs, or gallops. No clubbing, cyanosis, edema.  Radials/DP/PT 2+ and equal bilaterally.  Respiratory:  Respirations regular and unlabored, clear to auscultation bilaterally. GI: Soft, nontender, nondistended. MS: No deformity or atrophy. Skin: Warm and dry, no rash. Neuro:  Strength and sensation are intact. Psych: Normal affect.  Assessment & Plan    1. NICM/chronic systolic heart failure -euvolemic and well compensated on exam.  Echocardiogram upcoming 04/30/20. NYHA I with no dyspnea, no activity intolerance.  Reports very mild lower extremity edema at the end of the day  after sitting driving a truck, likely venous insufficiency.  Continue present GDMT including Entresto 24-26 mg twice daily, Lasix 20 mg every other day, Toprol 50 mg daily, spironolactone 25 mg daily.  He reports compliance with all of his medications.  2. PAF s/p DCCV 01/2016 & 03/2020  Chronic anticoagulation - Maintaining normal sinus rhythm postconversion.  Reports resolution of his symptoms with no recurrent dyspnea, fatigue.  Continue Toprol 50 mg daily, Eliquis 5 mg twice daily.  As cardioversions were 3 years apart will defer AAD at this time.  He denies bleeding complications on Eliquis.  3. Nonobstructive CAD - Reports no chest pain, pressure, tightness.  Cardiac cath 12/2015 with ostial-proximal LAD 40% stenosed. ETT 04/2017 was a normal treadmill stress test no evidence of ischemia with maximum workload 12.8 METS.  No indication for ischemic evaluation at this time.  Continue GDMT including beta-blocker and statin.  No aspirin secondary to chronic anticoagulation with Eliquis.  4. EtOH use - Drinks twice per week and has 4 beers. Encouraged complete cessation.  Discussed that alcohol could be contributory to recurrent atrial fibrillation.  5. HLD, LDL goal<70 -lipid panel 03/19/2020 with LDL 127.  Atorvastatin increased to 40 mg daily.  Plan for repeat lipid/liver function testing in mid August.  Disposition: Echocardiogram later this month.  [facility].  Follow-up in 6 months with Dr. September or APP  Kirke Corin, NP 04/09/2020, 9:35 AM

## 2020-04-09 NOTE — Patient Instructions (Signed)
Medication Instructions:  No medication changes today.   *If you need a refill on your cardiac medications before your next appointment, please call your pharmacy*  Lab Work: Your physician recommends that you return for lab work in: 6 weeks for lipid panel and CMET. [facility] to have [facility] done. Please be fasting for this lab. Please stop by August 16th - 20th.   If you have labs (blood work) drawn today and your tests are completely normal, you will receive your results only by:  MyChart Message (if you have MyChart) OR  A paper copy in the mail If you have any lab test that is abnormal or we need to change your treatment, we will call you to review the results.  Testing/Procedures: Your EKG today shows normal sinus rhythm.    Follow-Up: At [facility], you and your health needs are our priority.  As part of our continuing mission to provide you with exceptional heart care, we have created designated Provider Care Teams.  These Care Teams include your primary Cardiologist (physician) and Advanced Practice Providers (APPs -  Physician Assistants and Nurse Practitioners) who all work together to provide you with the care you need, when you need it.  We recommend signing up for the patient portal called "MyChart".  Sign up information is provided on this After Visit Summary.  MyChart is used to connect with patients for Virtual Visits (Telemedicine).  Patients are able to view lab/test results, encounter notes, upcoming appointments, etc.  Non-urgent messages can be sent to your provider as well.   To learn more about what you can do with MyChart, go to [email or web address].    Your next appointment:   6 month(s)  The format for your next appointment:   In Person  Provider:   You may see Lorine Bears, MD or one of the following Advanced Practice Providers on your designated Care Team:    Nicolasa Ducking, NP  Eula Listen, PA-C  Marisue Ivan,  PA-C  Gillian Shields, NP  Other Instructions   Fat and Cholesterol Restricted Eating Plan Getting too much fat and cholesterol in your diet may cause health problems. Choosing the right foods helps keep your fat and cholesterol at normal levels. This can keep you from getting certain diseases.  What are tips for following this plan? Meal planning  At meals, divide your plate into four equal parts: ? Fill one-half of your plate with vegetables and green salads. ? Fill one-fourth of your plate with whole grains. ? Fill one-fourth of your plate with low-fat (lean) protein foods.  Eat fish that is high in omega-3 fats at least two times a week. This includes mackerel, tuna, sardines, and salmon.  Eat foods that are high in fiber, such as whole grains, beans, apples, broccoli, carrots, peas, and barley. General tips   Work with your doctor to lose weight if you need to.  Avoid: ? Foods with added sugar. ? Fried foods. ? Foods with partially hydrogenated oils.  Limit alcohol intake to no more than 1 drink a day for nonpregnant women and 2 drinks a day for men. One drink equals 12 oz of beer, 5 oz of wine, or 1 oz of hard liquor. Reading food labels  Check food labels for: ? Trans fats. ? Partially hydrogenated oils. ? Saturated fat (g) in each serving. ? Cholesterol (mg) in each serving. ? Fiber (g) in each serving.  Choose foods with healthy fats, such as: ? Monounsaturated  fats. ? Polyunsaturated fats. ? Omega-3 fats.  Choose grain products that have whole grains. Look for the word "whole" as the first word in the ingredient list. Cooking  Cook foods using low-fat methods. These include baking, boiling, grilling, and broiling.  Eat more home-cooked foods. Eat at restaurants and buffets less often.  Avoid cooking using saturated fats, such as butter, cream, palm oil, palm kernel oil, and coconut oil. Recommended foods  Fruits  All fresh, canned (in natural  juice), or frozen fruits. Vegetables  Fresh or frozen vegetables (raw, steamed, roasted, or grilled). Green salads. Grains  Whole grains, such as whole wheat or whole grain breads, crackers, cereals, and pasta. Unsweetened oatmeal, bulgur, barley, quinoa, or brown rice. Corn or whole wheat flour tortillas. Meats and other protein foods  Ground beef (85% or leaner), grass-fed beef, or beef trimmed of fat. Skinless chicken or [place]. Ground chicken or [place]. Pork trimmed of fat. All fish and seafood. Egg whites. Dried beans, peas, or lentils. Unsalted nuts or seeds. Unsalted canned beans. Nut butters without added sugar or oil. Dairy  Low-fat or nonfat dairy products, such as skim or 1% milk, 2% or reduced-fat cheeses, low-fat and fat-free ricotta or cottage cheese, or plain low-fat and nonfat yogurt. Fats and oils  Tub margarine without trans fats. Light or reduced-fat mayonnaise and salad dressings. Avocado. Olive, canola, sesame, or safflower oils. The items listed above may not be a complete list of foods and beverages you can eat. Contact a dietitian for more information. Foods to avoid Fruits  Canned fruit in heavy syrup. Fruit in cream or butter sauce. Fried fruit. Vegetables  Vegetables cooked in cheese, cream, or butter sauce. Fried vegetables. Grains  White bread. White pasta. White rice. Cornbread. Bagels, pastries, and croissants. Crackers and snack foods that contain trans fat and hydrogenated oils. Meats and other protein foods  Fatty cuts of meat. Ribs, chicken wings, bacon, sausage, bologna, salami, chitterlings, fatback, hot dogs, bratwurst, and packaged lunch meats. Liver and organ meats. Whole eggs and egg yolks. Chicken and [place] with skin. Fried meat. Dairy  Whole or 2% milk, cream, half-and-half, and cream cheese. Whole milk cheeses. Whole-fat or sweetened yogurt. Full-fat cheeses. Nondairy creamers and whipped toppings. Processed cheese, cheese spreads, and  cheese curds. Beverages  Alcohol. Sugar-sweetened drinks such as sodas, lemonade, and fruit drinks. Fats and oils  Butter, stick margarine, lard, shortening, ghee, or bacon fat. Coconut, palm kernel, and palm oils. Sweets and desserts  Corn syrup, sugars, honey, and molasses. Candy. Jam and jelly. Syrup. Sweetened cereals. Cookies, pies, cakes, donuts, muffins, and ice cream. The items listed above may not be a complete list of foods and beverages you should avoid. Contact a dietitian for more information. Summary  Choosing the right foods helps keep your fat and cholesterol at normal levels. This can keep you from getting certain diseases.  At meals, fill one-half of your plate with vegetables and green salads.  Eat high-fiber foods, like whole grains, beans, apples, carrots, peas, and barley.  Limit added sugar, saturated fats, alcohol, and fried foods. This information is not intended to replace advice given to you by your health care provider. Make sure you discuss any questions you have with your health care provider. Document Revised: 05/25/2018 Document Reviewed: 06/08/2017 Elsevier Patient Education  2020 [employer].

## 2020-04-30 ENCOUNTER — Other Ambulatory Visit: Payer: Self-pay

## 2020-04-30 ENCOUNTER — Ambulatory Visit (INDEPENDENT_AMBULATORY_CARE_PROVIDER_SITE_OTHER): Payer: BC Managed Care – PPO

## 2020-04-30 DIAGNOSIS — R0609 Other forms of dyspnea: Secondary | ICD-10-CM | POA: Diagnosis not present

## 2020-04-30 DIAGNOSIS — I480 Paroxysmal atrial fibrillation: Secondary | ICD-10-CM | POA: Diagnosis not present

## 2020-05-03 LAB — ECHOCARDIOGRAM COMPLETE
AR max vel: 3.69 cm2
AV Area VTI: 4.49 cm2
AV Area mean vel: 4.16 cm2
AV Mean grad: 4 mmHg
AV Peak grad: 8.2 mmHg
Ao pk vel: 1.43 m/s
Area-P 1/2: 2.64 cm2
Calc EF: 51.7 %
S' Lateral: 4 cm
Single Plane A2C EF: 50 %
Single Plane A4C EF: 52.4 %

## 2020-05-06 ENCOUNTER — Telehealth: Payer: Self-pay

## 2020-05-06 NOTE — Telephone Encounter (Signed)
Call to patient to review echo results.    Pt verbalized understanding and has no further questions at this time.    Advised pt to call for any further questions or concerns.  No further orders.   

## 2020-05-06 NOTE — Telephone Encounter (Signed)
Attempted to call patient. LMTCB 05/06/2020   

## 2020-05-06 NOTE — Telephone Encounter (Signed)
-----   Message from Alver Sorrow, NP sent at 05/06/2020 11:29 AM EDT ----- Echocardiogram shows normal heart pumping function. Heart is mildly stiff. No significant valvular abnormalities. Mildly elevated pressure in the lungs. Overall good result!  Okay to renew CDL.

## 2020-05-16 ENCOUNTER — Encounter: Payer: Self-pay | Admitting: Family

## 2020-05-16 ENCOUNTER — Telehealth: Payer: Self-pay | Admitting: Cardiovascular Disease

## 2020-05-16 NOTE — Telephone Encounter (Signed)
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

## 2020-05-16 NOTE — Telephone Encounter (Signed)
Spoke with the patient and made him aware of the below. Advised the patient that the [employer] [facility] [employer] form he dropped off has been completed by Gillian Shields, NP. 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  Fax confirmation received.

## 2020-08-01 ENCOUNTER — Other Ambulatory Visit: Payer: Self-pay | Admitting: Cardiovascular Disease

## 2020-11-08 ENCOUNTER — Ambulatory Visit: Payer: BC Managed Care – PPO | Admitting: Cardiovascular Disease

## 2020-11-11 ENCOUNTER — Ambulatory Visit (INDEPENDENT_AMBULATORY_CARE_PROVIDER_SITE_OTHER): Payer: 59 | Admitting: Family

## 2020-11-11 ENCOUNTER — Other Ambulatory Visit: Payer: Self-pay

## 2020-11-11 ENCOUNTER — Encounter: Payer: Self-pay | Admitting: Family

## 2020-11-11 VITALS — BP 128/82 | HR 57 | Ht 74.0 in | Wt 262.0 lb

## 2020-11-11 DIAGNOSIS — I480 Paroxysmal atrial fibrillation: Secondary | ICD-10-CM

## 2020-11-11 DIAGNOSIS — E785 Hyperlipidemia, unspecified: Secondary | ICD-10-CM | POA: Diagnosis not present

## 2020-11-11 DIAGNOSIS — I428 Other cardiomyopathies: Secondary | ICD-10-CM | POA: Diagnosis not present

## 2020-11-11 DIAGNOSIS — Z7901 Long term (current) use of anticoagulants: Secondary | ICD-10-CM | POA: Diagnosis not present

## 2020-11-11 DIAGNOSIS — I25118 Atherosclerotic heart disease of native coronary artery with other forms of angina pectoris: Secondary | ICD-10-CM

## 2020-11-11 MED ORDER — SPIRONOLACTONE 25 MG PO TABS: 25.0000 mg | ORAL_TABLET | Freq: Every day | ORAL | 1 refills | Status: DC

## 2020-11-11 MED ORDER — ENTRESTO 24-26 MG PO TABS: 1.0000 | ORAL_TABLET | Freq: Two times a day (BID) | ORAL | 5 refills | Status: DC

## 2020-11-11 MED ORDER — APIXABAN 5 MG PO TABS: 5.0000 mg | ORAL_TABLET | Freq: Two times a day (BID) | ORAL | 5 refills | Status: DC

## 2020-11-11 MED ORDER — FUROSEMIDE 20 MG PO TABS: 20.0000 mg | ORAL_TABLET | ORAL | 1 refills | Status: DC

## 2020-11-11 MED ORDER — ATORVASTATIN CALCIUM 40 MG PO TABS: 40.0000 mg | ORAL_TABLET | Freq: Every day | ORAL | 1 refills | Status: DC

## 2020-11-11 NOTE — Progress Notes (Signed)
Office Visit    Patient Name: Walter Banks Date of Encounter: 11/11/2020  Primary Care Provider:  Lorre Munroe, NP Primary Cardiologist:  Lorine Bears, MD Electrophysiologist:  None   Chief Complaint    Walter Banks is a 60 y.o. male with a hx of nonischemic cardiomyopathy in the setting persistent atrial fibrillation s/p DCCV 01/2016, chronic systolic heart failure, hypertension, atrial fibrillation with RVR, nonobstructive coronary disease (ostial-proximal LAD 40%), obesity presents today for follow-up of cardiomyopathy and atrial fibrillation.  Past Medical History    Past Medical History:  Diagnosis Date  . Chronic systolic CHF (congestive heart failure) (HCC)    a. 12/08/2015 Echo: EF of 20-25% w/ diffuse hypokinesis, severely dilated LA and RA, moderate MR and TR, PA peak pressure of 50 mmHg; b. 03/2016 Echo: EF 50-55%, no rwma, mildly dil LA, nl RV fxn; c. 04/2018 Echo: EF 55-60%, mild LVH. Triv AI. Mildly dil Ao arch. Mildly dil LA/RV/RA. PASP .  Marland Kitchen H. pylori infection   . History of pneumonia   . Mitral regurgitation    a. 12/2015 Echo: Mod MR;  b. 03/2016 Echo: No MR (EF normalized).  Marland Kitchen NICM (nonischemic cardiomyopathy) (HCC)    a. cardiac cath 12/10/15: pLAD 40%, o/w no stenosis, severely elevated right heart pressures;  b. 03/2016 Echo: EF 50-55%; c. 04/2018 Echo: EF 55-60%.  Marland Kitchen PAF (paroxysmal atrial fibrillation) (HCC)    a. Dx 12/2015;  b. s/p DCCV 01/2016;  c. on Eliquis [CHADS2VASc = 1 (CHF)]  . VT (ventricular tachycardia) (HCC)    a. 38 beat run during admission 12/2015-->associated with flushing.   Past Surgical History:  Procedure Laterality Date  . CARDIAC CATHETERIZATION N/A 12/10/2015   Procedure: Right and Left Heart Cath;  Surgeon: Antonieta Iba, MD;  Location: [facility] INVASIVE CV LAB;  Service: Cardiovascular;  Laterality: N/A;  . CARDIOVERSION N/A 03/26/2020   Procedure: CARDIOVERSION;  Surgeon: Yvonne Kendall, MD;  Location: [facility] ORS;  Service:  Cardiovascular;  Laterality: N/A;  . ELECTROPHYSIOLOGIC STUDY N/A 01/10/2016   Procedure: CARDIOVERSION;  Surgeon: Iran Ouch, MD;  Location: [facility] ORS;  Service: Cardiovascular;  Laterality: N/A;  . WRIST SURGERY Left 2013   Allergies No Known Allergies  History of Present Illness    Walter Banks is a 60 y.o. male with a hx of nonischemic cardiomyopathy in the setting persistent atrial fibrillation s/p DCCV 01/2016, chronic systolic heart failure, hypertension, atrial fibrillation with RVR, nonobstructive coronary disease (last LAD proximal LAD 40%), obesity.  He was last seen 04/09/2020.  Echo 12/2015 in the setting of atrial fibrillation with LVEF 20-25%.  Subsequent echocardiogram 03/2016 with LVEF 50-55%, normal wall motion, ascending aorta mildly dilated, no significant valvular abnormalities.  ETT in 2017 and 2018 were normal.  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  Echo 04/2018 LVEF 55 to 60%, normal wall motion, trivial AI, ascending aorta borderline dilated, aortic arch mildly dilated, LA mildly dilated, RV mildly dilated.  Seen in clinic 03/19/2020 nothing increased dyspnea over the previous six months and found to be in recurrent atrial fibrillation. Labs showed LDL 127 and Atorvastatin was increased to 40mg  daily. Underwent cardioversion 03/26/20. Seen in follow up 04/09/20 with improvement in fatigue as well as dyspnea. Echo 04/30/20 showed LVEF 55%, no RWMA, mild LVH, gr1DD, RV normal size and function, mildly elevated PASP.   Enjoys working on his classic car in his spare time. Has a 1960 Chevelle convertible he refurbished  throughout the pandemic. Works driving a truck.  Changed jobs six months ago. Still driving a truck but much more flexible which he enjoys. He has gained about 13 pounds over the last 6 months, tells me he is "getting back on track" in regards to eating healthy. No edema, orthopnea, PND. No chest pain, pressure, tightness. No shortness of breath  nor dyspnea on exertion.    EKGs/Labs/Other Studies Reviewed:   The following studies were reviewed today:  Echo 04/2020  1. Left ventricular ejection fraction, by estimation, is 55%. The left  ventricle has normal function. The left ventricle has no regional wall  motion abnormalities. There is mild left ventricular hypertrophy. Left  ventricular diastolic parameters are  consistent with Grade I diastolic dysfunction (impaired relaxation).   2. Right ventricular systolic function is normal. The right ventricular  size is normal. There is mildly elevated pulmonary artery systolic  pressure. The estimated right ventricular systolic pressure is 35.8 mmHg   Echo 04/2018 Study Conclusions   - Left ventricle: The cavity size was mildly dilated. Wall    thickness was increased in a pattern of mild LVH. Systolic    function was normal. The estimated ejection fraction was in the    range of 55% to 60%. Wall motion was normal; there were no    regional wall motion abnormalities. Left ventricular diastolic    function parameters were normal.  - Aortic valve: There was trivial regurgitation.  - Ascending aorta: The ascending aorta was borderline dilated.  - Aortic arch: The aortic arch was mildly dilated.  - Left atrium: The atrium was mildly dilated.  - Right ventricle: The cavity size was mildly dilated. Wall    thickness was normal. Systolic function was normal.  - Right atrium: The atrium was mildly dilated.  - Pulmonary arteries: Systolic pressure was at the upper limits of    normal, estimated to be 30 mm Hg.   EKG:  EKG is ordered today.  The ekg ordered today demonstrates SB 57 bpm with sinus arrhythmia.   Recent Labs: 03/19/2020: ALT 28; BUN 20; Creatinine, Ser 0.92; Hemoglobin 14.6; Magnesium 1.9; Platelets 199; Potassium 4.4; Sodium 141; TSH 3.070  Recent Lipid Panel    Component Value Date/Time   CHOL 192 03/19/2020 1014   TRIG 136 03/19/2020 1014   HDL 41 03/19/2020 1014    CHOLHDL 4.7 03/19/2020 1014   CHOLHDL 5.7 12/10/2015 0157   VLDL 16 12/10/2015 0157   LDLCALC 127 (H) 03/19/2020 1014   LDLDIRECT 110 (H) 04/11/2018 1450   Home Medications   Current Meds  Medication Sig  . metoprolol succinate (TOPROL-XL) 50 MG 24 hr tablet TAKE 1 TABLET BY MOUTH ONCE DAILY WITH  OR  IMMEDIATELY  FOLLOWING  A  MEAL  . multivitamin (ONE-A-DAY MEN'S) TABS tablet Take 1 tablet by mouth daily.  . [DISCONTINUED] atorvastatin (LIPITOR) 40 MG tablet Take 1 tablet (40 mg total) by mouth daily.  . [DISCONTINUED] ELIQUIS 5 MG TABS tablet Take 1 tablet by mouth twice daily  . [DISCONTINUED] ENTRESTO 24-26 MG Take 1 tablet by mouth twice daily  . [DISCONTINUED] furosemide (LASIX) 20 MG tablet Take 1 tablet (20 mg total) by mouth every other day.  . [DISCONTINUED] spironolactone (ALDACTONE) 25 MG tablet Take 1 tablet by mouth once daily    Review of Systems   All other systems reviewed and are otherwise negative except as noted above.  Physical Exam    VS:  BP 128/82 (BP Location: Left Arm,  Patient Position: Sitting, Cuff Size: Normal)   Pulse (!) 57   Ht 6\' 2"  (1.88 m)   Wt 262 lb (118.8 kg)   SpO2 98%   BMI 33.64 kg/m  , BMI Body mass index is 33.64 kg/m. GEN: Well nourished, overweight, well developed, in no acute distress. HEENT: normal. Neck: Supple, no JVD, carotid bruits, or masses. Cardiac:  RRR, no murmurs, rubs, or gallops. No clubbing, cyanosis, edema.  Radials/DP/PT 2+ and equal bilaterally.  Respiratory:  Respirations regular and unlabored, clear to auscultation bilaterally. GI: Soft, nontender, nondistended. MS: No deformity or atrophy. Skin: Warm and dry, no rash. Neuro:  Strength and sensation are intact. Psych: Normal affect.  Assessment & Plan    1. NICM/chronic systolic heart failure - Echo with LVEF 55%. NYHA I. Continue present GDMT including Entresto 24-26 mg twice daily, Lasix 20 mg every other day, Toprol 50 mg daily, Spironolactone  25 mg daily.  He reports compliance with all of his medications. Refills provided. Initial BP in clinic mildly elevated though improved to 128/82 without intervention, encouraged to monitor at home. If BP persistently elevated, consider increased dose of Entresto. Low salt, heart healthy diet and regular cardiovascular encouraged.   2. PAF s/p DCCV 01/2016 & 03/2020  Chronic anticoagulation - Continue Toprol 50 mg daily, Eliquis 5 mg twice daily.  As cardioversions were 3 years apart will defer AAD at this time.  He denies bleeding complications on Eliquis. CBC today for monitoring.   3. Nonobstructive CAD - Cardiac cath 12/2015 with ostial-proximal LAD 40% stenosed. ETT 04/2017 was a normal treadmill stress test no evidence of ischemia with maximum workload 12.8 METS.  No indication for ischemic evaluation at this time.  Continue GDMT including beta-blocker and statin.  No aspirin secondary to chronic anticoagulation with Eliquis.  4. EtOH use - Encouraged complete cessation.  Discussed that alcohol could be contributory to previous episode of recurrent atrial fibrillation.   5. HLD, LDL goal<70 -lipid panel 03/19/2020 with LDL 127.  Atorvastatin increased to 40 mg daily. CMP, lipid panel today.    Disposition:  Follow-up in 6 months with Dr. 03/21/2020 or APP  Kirke Corin, NP 11/11/2020, 10:59 AM

## 2020-11-11 NOTE — Patient Instructions (Signed)
Medication Instructions:  Continue your current medications.  *If you need a refill on your cardiac medications before your next appointment, please call your pharmacy*  Lab Work: Your provider recommends lab work today: CMP, lipid panel, CBC  If you have labs (blood work) drawn today and your tests are completely normal, you will receive your results only by: Marland Kitchen MyChart Message (if you have MyChart) OR . A paper copy in the mail If you have any lab test that is abnormal or we need to change your treatment, we will call you to review the results.  Testing/Procedures: Your EKG today shows sinus bradycardia which is a regular but slightly slow heart rhythm at 57 beats per minute. This is a good result!   Follow-Up: At [facility], you and your health needs are our priority.  As part of our continuing mission to provide you with exceptional heart care, we have created designated Provider Care Teams.  These Care Teams include your primary Cardiologist (physician) and Advanced Practice Providers (APPs -  Physician Assistants and Nurse Practitioners) who all work together to provide you with the care you need, when you need it.  We recommend signing up for the patient portal called "MyChart".  Sign up information is provided on this After Visit Summary.  MyChart is used to connect with patients for Virtual Visits (Telemedicine).  Patients are able to view lab/test results, encounter notes, upcoming appointments, etc.  Non-urgent messages can be sent to your provider as well.   To learn more about what you can do with MyChart, go to [email or web address].    Your next appointment:   6 month(s)  The format for your next appointment:   In Person  Provider:   You may see Lorine Bears, MD or one of the following Advanced Practice Providers on your designated Care Team:    Nicolasa Ducking, NP  Eula Listen, PA-C  Marisue Ivan, PA-C  Cadence Fransico Michael, [place]  Gillian Shields, NP  Other  Instructions  Heart Healthy Diet Recommendations: A low-salt diet is recommended. Meats should be grilled, baked, or boiled. Avoid fried foods. Focus on lean protein sources like fish or chicken with vegetables and fruits. The American Heart Association is a Chief Technology Officer!  American Heart Association Diet and Lifeystyle Recommendations   Exercise recommendations: The American Heart Association recommends 150 minutes of moderate intensity exercise weekly. Try 30 minutes of moderate intensity exercise 4-5 times per week. This could include walking, jogging, or swimming.

## 2020-11-12 ENCOUNTER — Telehealth: Payer: Self-pay | Admitting: *Deleted

## 2020-11-12 DIAGNOSIS — E785 Hyperlipidemia, unspecified: Secondary | ICD-10-CM

## 2020-11-12 DIAGNOSIS — Z79899 Other long term (current) drug therapy: Secondary | ICD-10-CM

## 2020-11-12 NOTE — Telephone Encounter (Signed)
Spoke to pt, notified of lab results and provider's recc.  Pt confirmed that he has NOT been taking Atorvastatin regularly.  Pt verbalized that he will resume Atorvastatin 40mg  daily per recc, and verbalizes understanding of lipid-lowering diet as well as instructions to help improve liver enzymes. Pt will have repeat FASTING labs for Lipid/ Cmet in 8 weeks at the [facility]. Orders placed. Pt has no further questions.

## 2020-11-12 NOTE — Telephone Encounter (Signed)
-----   Message from Alver Sorrow, NP sent at 11/12/2020  8:05 AM EST ----- Normal kidney function and electrolytes. One of his liver enzymes was elevated - recommend avoiding fried foods, Tylenol, and alcohol. Lipid panel worse than previous. Please confirm he has been taking Atorvastatin 40mg  daily. If he has not been taking regularly, resume same medication and repeat lipid panel/CMP in 8 weeks. If he has been taking, increase Atorvastatin to 80mg  daily with repeat lipid/CMP in 8 weeks. Very important to follow a lipid-lowering diet eating proteins that are grilled/baked, avoiding friend foods, focusing on lean meats and vegetables. I will mail him educational information on Thursday for his review.

## 2020-11-20 LAB — LIPID PANEL
Chol/HDL Ratio: 8.1 ratio — ABNORMAL HIGH (ref 0.0–5.0)
Cholesterol, Total: 276 mg/dL — ABNORMAL HIGH (ref 100–199)
HDL: 34 mg/dL — ABNORMAL LOW (ref >39)
LDL Chol Calc (NIH): 184 mg/dL — ABNORMAL HIGH (ref 0–99)
Triglycerides: 297 mg/dL — ABNORMAL HIGH (ref 0–149)
VLDL Cholesterol Cal: 58 mg/dL — ABNORMAL HIGH (ref 5–40)

## 2020-11-20 LAB — COMPREHENSIVE METABOLIC PANEL
ALT: 68 IU/L — ABNORMAL HIGH (ref 0–44)
AST: 31 IU/L (ref 0–40)
Albumin/Globulin Ratio: 2 (ref 1.2–2.2)
Albumin: 4.7 g/dL (ref 3.8–4.9)
Alkaline Phosphatase: 92 IU/L (ref 44–121)
BUN/Creatinine Ratio: 15 (ref 9–20)
BUN: 14 mg/dL (ref 6–24)
Bilirubin Total: 0.4 mg/dL (ref 0.0–1.2)
CO2: 24 mmol/L (ref 20–29)
Calcium: 9.2 mg/dL (ref 8.7–10.2)
Chloride: 103 mmol/L (ref 96–106)
Creatinine, Ser: 0.92 mg/dL (ref 0.76–1.27)
GFR calc Af Amer: 105 mL/min/{1.73_m2} (ref >59)
GFR calc non Af Amer: 91 mL/min/{1.73_m2} (ref >59)
Globulin, Total: 2.4 g/dL (ref 1.5–4.5)
Glucose: 111 mg/dL — ABNORMAL HIGH (ref 65–99)
Potassium: 4.6 mmol/L (ref 3.5–5.2)
Sodium: 140 mmol/L (ref 134–144)
Total Protein: 7.1 g/dL (ref 6.0–8.5)

## 2020-11-20 LAB — CBC

## 2020-12-23 ENCOUNTER — Encounter: Payer: Self-pay | Admitting: Family

## 2021-02-07 ENCOUNTER — Other Ambulatory Visit: Payer: Self-pay | Admitting: Cardiovascular Disease

## 2021-02-07 DIAGNOSIS — I428 Other cardiomyopathies: Secondary | ICD-10-CM

## 2021-02-07 NOTE — Telephone Encounter (Signed)
Rx request sent to pharmacy.  

## 2021-05-09 ENCOUNTER — Other Ambulatory Visit
Admission: RE | Admit: 2021-05-09 | Discharge: 2021-05-09 | Disposition: A | Discharge status: Home / Self Care | Payer: 59 | Attending: Family | Admitting: Family

## 2021-05-09 ENCOUNTER — Telehealth: Payer: Self-pay

## 2021-05-09 ENCOUNTER — Telehealth: Payer: Self-pay | Admitting: Cardiovascular Disease

## 2021-05-09 DIAGNOSIS — E785 Hyperlipidemia, unspecified: Secondary | ICD-10-CM | POA: Insufficient documentation

## 2021-05-09 DIAGNOSIS — Z79899 Other long term (current) drug therapy: Secondary | ICD-10-CM | POA: Diagnosis present

## 2021-05-09 LAB — COMPREHENSIVE METABOLIC PANEL
ALT: 123 U/L — ABNORMAL HIGH (ref 0–44)
AST: 59 U/L — ABNORMAL HIGH (ref 15–41)
Albumin: 4.9 g/dL (ref 3.5–5.0)
Alkaline Phosphatase: 78 U/L (ref 38–126)
Anion gap: 8 (ref 5–15)
BUN: 13 mg/dL (ref 6–20)
CO2: 23 mmol/L (ref 22–32)
Calcium: 9.2 mg/dL (ref 8.9–10.3)
Chloride: 106 mmol/L (ref 98–111)
Creatinine, Ser: 0.79 mg/dL (ref 0.61–1.24)
GFR, Estimated: >60 mL/min (ref >60)
Glucose, Bld: 139 mg/dL — ABNORMAL HIGH (ref 70–99)
Potassium: 4.3 mmol/L (ref 3.5–5.1)
Sodium: 137 mmol/L (ref 135–145)
Total Bilirubin: 0.7 mg/dL (ref 0.3–1.2)
Total Protein: 7.9 g/dL (ref 6.5–8.1)

## 2021-05-09 LAB — LIPID PANEL
Cholesterol: 225 mg/dL — ABNORMAL HIGH (ref 0–200)
HDL: 42 mg/dL (ref >40)
LDL Cholesterol: 152 mg/dL — ABNORMAL HIGH (ref 0–99)
Total CHOL/HDL Ratio: 5.4 RATIO
Triglycerides: 154 mg/dL — ABNORMAL HIGH (ref <150)
VLDL: 31 mg/dL (ref 0–40)

## 2021-05-09 MED ORDER — ATORVASTATIN CALCIUM 20 MG PO TABS: 20.0000 mg | ORAL_TABLET | Freq: Every day | ORAL | 3 refills | Status: DC

## 2021-05-09 MED ORDER — EZETIMIBE 10 MG PO TABS
10.0000 mg | ORAL_TABLET | Freq: Every day | ORAL | 3 refills | Status: DC
Start: 2021-05-09 — End: 2021-05-14

## 2021-05-09 NOTE — Telephone Encounter (Signed)
Called patient with changes. Sent medications to patient's pharmacy of choice. Patient verbalized understanding.

## 2021-05-09 NOTE — Telephone Encounter (Signed)
-----   Message from Alver Sorrow, NP sent at 05/09/2021  4:56 PM EDT ----- Recommend reducing Atorvastatin to 20mg  daily and starting Zetia 10mg  daily. This will help improve liver enzymes and cholesterol.

## 2021-05-09 NOTE — Telephone Encounter (Signed)
Received DOT clearance request .  Placed in nurse box.

## 2021-05-09 NOTE — Telephone Encounter (Signed)
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 Advised that we would need to check with provider since there are specific tests that need to be done in order to provide that clearance. Reviewed that I will route message over to the primary cardiologist and the last APP but this may take a few days for reply. He verbalized understanding and had no further questions at this time.

## 2021-05-12 NOTE — Telephone Encounter (Signed)
He was last seen 11/2020 and recommended for 6 month follow up. Appt has been scheduled for 06/16/21. He will need office visit for the DOT clearance.   Alver Sorrow

## 2021-05-12 NOTE — Telephone Encounter (Signed)
Spoke with patient and reviewed that he would need to be seen before we can provide any clearance. Confirmed scheduled appointment here in our office and advised that we would review and determine if any testing is needed at that time. He verbalized understanding with no further questions at this time.

## 2021-05-12 NOTE — Telephone Encounter (Signed)
Upcoming appointment with APP on 06/16/21.  DOT form received and patient needs appt before we can complete. Placed form in my file box under "Pending".

## 2021-05-14 ENCOUNTER — Ambulatory Visit (INDEPENDENT_AMBULATORY_CARE_PROVIDER_SITE_OTHER): Payer: 59 | Admitting: Physician Assistant

## 2021-05-14 ENCOUNTER — Encounter: Payer: Self-pay | Admitting: Physician Assistant

## 2021-05-14 ENCOUNTER — Other Ambulatory Visit: Payer: Self-pay

## 2021-05-14 VITALS — BP 126/70 | HR 55 | Ht 74.0 in | Wt 273.5 lb

## 2021-05-14 DIAGNOSIS — I10 Essential (primary) hypertension: Secondary | ICD-10-CM

## 2021-05-14 DIAGNOSIS — R945 Abnormal results of liver function studies: Secondary | ICD-10-CM

## 2021-05-14 DIAGNOSIS — I4819 Other persistent atrial fibrillation: Secondary | ICD-10-CM | POA: Diagnosis not present

## 2021-05-14 DIAGNOSIS — I2510 Atherosclerotic heart disease of native coronary artery without angina pectoris: Secondary | ICD-10-CM | POA: Diagnosis not present

## 2021-05-14 DIAGNOSIS — E785 Hyperlipidemia, unspecified: Secondary | ICD-10-CM

## 2021-05-14 DIAGNOSIS — I428 Other cardiomyopathies: Secondary | ICD-10-CM

## 2021-05-14 MED ORDER — ATORVASTATIN CALCIUM 10 MG PO TABS
10.0000 mg | ORAL_TABLET | Freq: Every day | ORAL | 3 refills | Status: DC
Start: 2021-05-14 — End: 2022-02-13

## 2021-05-14 NOTE — Patient Instructions (Addendum)
Medication Instructions:  Your physician has recommended you make the following change in your medication:   STOP Ezetimibe (Zetia) DECREASE Atorvastatin to 10 mg once a day   *If you need a refill on your cardiac medications before your next appointment, please call your pharmacy*   Lab Work: Liver function to be done in 2 to 3 months which would be between October 10 th and November 10 th. No appointment is needed for this and you will go to [facility]tr[facility] at [facility] then go to 1st desk on the right to check in, past the screening table.   Lab hours: Monday- Friday (7:30 am- 5:30 pm)  If you have labs (blood work) drawn today and your tests are completely normal, you will receive your results only by: MyChart Message (if you have MyChart) OR A paper copy in the mail If you have any lab test that is abnormal or we need to change your treatment, we will call you to review the results.   Testing/Procedures: None   Follow-Up: At [facility], you and your health needs are our priority.  As part of our continuing mission to provide you with exceptional heart care, we have created designated Provider Care Teams.  These Care Teams include your primary Cardiologist (physician) and Advanced Practice Providers (APPs -  Physician Assistants and Nurse Practitioners) who all work together to provide you with the care you need, when you need it.   Your next appointment:   6 month(s)  The format for your next appointment:   In Person  Provider:   Lorine Bears, MD or Eula Listen, PA-C

## 2021-05-14 NOTE — Progress Notes (Signed)
Cardiology Office Note    Date:  05/14/2021   ID:  Walter Banks, DOB 02-13-1961, MRN [id number]  PCP:  Lorre MunroeBaity, Regina W, NP  Cardiologist:  Lorine BearsMuhammad Arida, MD  Electrophysiologist:  None   Chief Complaint: Follow-up  History of Present Illness:   Walter Banks is a 60 y.o. male with history of nonobstructive CAD, HFrEF secondary to  NICM with subsequent normalization of LV systolic function persistent A. fib on Eliquis status post DCCV in 01/2016 with repeat DCCV in 03/2020, HTN, abnormal liver function, and obesity who presents for follow-up of his cardiomyopathy and A. fib.  He was diagnosed with HFrEF secondary to nonischemic cardiomyopathy in the setting of persistent A. fib and the spring 2017.  Echo in 12/2015 was noted to be 20 to 25% at that time.  Diagnostic R/LHC showed nonobstructive LAD disease as outlined below.  He was medically managed and subsequently underwent cardioversion in 01/2016 after adequate anticoagulation.  Follow-up echo in 03/2016 showed improvement in LV systolic function with an EF of 50 to 55%, normal wall motion, no significant valvular abnormalities, and a mildly dilated ascending aorta.  ETT, for CDL evaluation, in [id number] and 2018 showed no evidence of ischemia.  Echo in 04/2018 showed an EF of 55 to 60%, normal wall motion, trivial aortic insufficiency, mild biatrial enlargement, borderline dilatation of the ascending aorta and mildly dilated aortic arch.  He was seen in 03/2020 noting increased dyspnea over the preceding 6 months and was noted to be in recurrent A. fib and underwent repeat DCCV with improvement in symptoms.  Echo in 04/2020 showed an EF of 55%, no regional wall motion abnormalities, mild LVH, grade 1 diastolic dysfunction, normal RV systolic function and ventricular cavity size, and a mildly elevated PASP.  He was last seen in the office in 11/2020 and was maintaining sinus rhythm.  He comes in doing very well from a cardiac perspective.  No  symptoms concerning for angina, dyspnea, palpitations, dizziness, presyncope, or syncope.  No lower extremity swelling.  He does note his diet has not been great since we last saw him.  He is working on this.  He is adherent and tolerating all cardiac medications.  No falls, hematochezia, or melena.  He does not have any active issues or concerns at this time.   Labs independently reviewed: 05/2021 - TC 225, TG 154, HDL 42, LDL 152, potassium 4.3, BUN 13, serum creatinine 0.79, albumin 4.9, AST 59, ALT 123 03/2020 - TSH normal, Hgb 14.6, PLT 199, magnesium 1.9  Past Medical History:  Diagnosis Date   Chronic systolic CHF (congestive heart failure) (HCC)    a. 12/08/2015 Echo: EF of 20-25% w/ diffuse hypokinesis, severely dilated LA and RA, moderate MR and TR, PA peak pressure of 50 mmHg; b. 03/2016 Echo: EF 50-55%, no rwma, mildly dil LA, nl RV fxn; c. 04/2018 Echo: EF 55-60%, mild LVH. Triv AI. Mildly dil Ao arch. Mildly dil LA/RV/RA. PASP 30mmHg.   H. pylori infection    History of pneumonia    Mitral regurgitation    a. 12/2015 Echo: Mod MR;  b. 03/2016 Echo: No MR (EF normalized).   NICM (nonischemic cardiomyopathy) (HCC)    a. cardiac cath 12/10/15: pLAD 40%, o/w no stenosis, severely elevated right heart pressures;  b. 03/2016 Echo: EF 50-55%; c. 04/2018 Echo: EF 55-60%.   PAF (paroxysmal atrial fibrillation) (HCC)    a. Dx 12/2015;  b. s/p DCCV 01/2016;  c. on Eliquis [CHADS2VASc =  1 (CHF)]   VT (ventricular tachycardia) (HCC)    a. 38 beat run during admission 12/2015-->associated with flushing.    Past Surgical History:  Procedure Laterality Date   CARDIAC CATHETERIZATION N/A 12/10/2015   Procedure: Right and Left Heart Cath;  Surgeon: Antonieta Iba, MD;  Location: [facility] INVASIVE [facility];  Service: Cardiovascular;  Laterality: N/A;   CARDIOVERSION N/A 03/26/2020   Procedure: CARDIOVERSION;  Surgeon: Yvonne Kendall, MD;  Location: [facility] ORS;  Service: Cardiovascular;  Laterality: N/A;    ELECTROPHYSIOLOGIC STUDY N/A 01/10/2016   Procedure: CARDIOVERSION;  Surgeon: Iran Ouch, MD;  Location: [facility] ORS;  Service: Cardiovascular;  Laterality: N/A;   WRIST SURGERY Left 2013    Current Medications: Current Meds  Medication Sig   apixaban (ELIQUIS) 5 MG TABS tablet Take 1 tablet (5 mg total) by mouth 2 (two) times daily.   atorvastatin (LIPITOR) 10 MG tablet Take 1 tablet (10 mg total) by mouth daily.   furosemide (LASIX) 20 MG tablet Take 1 tablet (20 mg total) by mouth every other day.   metoprolol succinate (TOPROL-XL) 50 MG 24 hr tablet TAKE 1 TABLET BY MOUTH ONCE DAILY WITH  OR  IMMEDIATELY  FOLLOWING  A  MEAL   multivitamin (ONE-A-DAY MEN'S) TABS tablet Take 1 tablet by mouth daily.   sacubitril-valsartan (ENTRESTO) 24-26 MG Take 1 tablet by mouth 2 (two) times daily.   spironolactone (ALDACTONE) 25 MG tablet Take 1 tablet by mouth once daily   [DISCONTINUED] atorvastatin (LIPITOR) 20 MG tablet Take 1 tablet (20 mg total) by mouth daily.   [DISCONTINUED] ezetimibe (ZETIA) 10 MG tablet Take 1 tablet (10 mg total) by mouth daily.    Allergies:   Patient has no known allergies.   Social History   Socioeconomic History   Marital status: Single    Spouse name: Not on file   Number of children: Not on file   Years of education: Not on file   Highest education level: Not on file  Occupational History   Occupation: Truck driver-Delivers bread  Tobacco Use   Smoking status: Never   Smokeless tobacco: Never  Vaping Use   Vaping Use: Never used  Substance and Sexual Activity   Alcohol use: Yes    Alcohol/week: 8.0 standard drinks    Types: 8 Cans of beer per week    Comment: weekly   Drug use: No   Sexual activity: Yes  Other Topics Concern   Not on file  Social History Narrative   Not on file   Social Determinants of Health   Financial Resource Strain: Not on file  Food Insecurity: Not on file  Transportation Needs: Not on file  Physical Activity: Not on  file  Stress: Not on file  Social Connections: Not on file     Family History:  The patient's family history includes Diabetes in his brother, father, and mother; Heart attack in his father; Heart disease in his mother; Heart failure in his sister; Hypertension in his father. There is no history of Cancer or Stroke.  ROS:   Review of Systems  Constitutional:  Negative for chills, diaphoresis, fever, malaise/fatigue and weight loss.  HENT:  Negative for congestion.   Eyes:  Negative for discharge and redness.  Respiratory:  Negative for cough, sputum production, shortness of breath and wheezing.   Cardiovascular:  Negative for chest pain, palpitations, orthopnea, claudication, leg swelling and PND.  Gastrointestinal:  Negative for abdominal pain, blood in stool, heartburn, melena, nausea and  vomiting.  Musculoskeletal:  Negative for falls and myalgias.  Skin:  Negative for rash.  Neurological:  Negative for dizziness, tingling, tremors, sensory change, speech change, focal weakness, loss of consciousness and weakness.  Endo/Heme/Allergies:  Does not bruise/bleed easily.  Psychiatric/Behavioral:  Negative for substance abuse. The patient is not nervous/anxious.   All other systems reviewed and are negative.   EKGs/Labs/Other Studies Reviewed:    Studies reviewed were summarized above. The additional studies were reviewed today:  2D echo 04/30/2020: 1. Left ventricular ejection fraction, by estimation, is 55%. The left  ventricle has normal function. The left ventricle has no regional wall  motion abnormalities. There is mild left ventricular hypertrophy. Left  ventricular diastolic parameters are  consistent with Grade I diastolic dysfunction (impaired relaxation).   2. Right ventricular systolic function is normal. The right ventricular  size is normal. There is mildly elevated pulmonary artery systolic  pressure. The estimated right ventricular systolic pressure is 35.8  mmHg. __________  2D echo 04/05/2018: - Left ventricle: The cavity size was mildly dilated. Wall    thickness was increased in a pattern of mild LVH. Systolic    function was normal. The estimated ejection fraction was in the    range of 55% to 60%. Wall motion was normal; there were no    regional wall motion abnormalities. Left ventricular diastolic    function parameters were normal.  - Aortic valve: There was trivial regurgitation.  - Ascending aorta: The ascending aorta was borderline dilated.  - Aortic arch: The aortic arch was mildly dilated.  - Left atrium: The atrium was mildly dilated.  - Right ventricle: The cavity size was mildly dilated. Wall    thickness was normal. Systolic function was normal.  - Right atrium: The atrium was mildly dilated.  - Pulmonary arteries: Systolic pressure was at the upper limits of    normal, estimated to be 30 mm Hg.  __________  ETT 04/06/2017:  Blood pressure demonstrated a normal response to exercise. There was no ST segment deviation noted during stress. No T wave inversion was noted during stress.   Normal treadmill stress test with no evidence of ischemia. Excellent exercise capacity. He was able to exercise for 10 minutes and 41 seconds with normal heart rate and blood pressure response to exercise. Maximum workload was 12.8 METS __________  ETT 04/30/2016: Blood pressure demonstrated a hypertensive response to exercise. There was no ST segment deviation noted during stress. T wave inversion was noted during stress in the II and III leads.   Normal treadmill stress test with no evidence of ischemia. Average exercise capacity. The patient was able to exercise for 6 minutes which is equivalent to 7 METs. Hypertensive response to exercise. ___________  2D echo 04/03/2016: - Left ventricle: The cavity size was mildly dilated. Systolic    function was normal. The estimated ejection fraction was in the    range of 50% to 55%. Wall  motion was normal; there were no    regional wall motion abnormalities. Left ventricular diastolic    function parameters were normal.  - Ascending aorta: The ascending aorta was mildly dilated.  - Left atrium: The atrium was at the upper limits of normal in    size.  - Right ventricle: Systolic function was normal.  - Pulmonary arteries: Systolic pressure was within the normal    range.   Impressions:   - Rhythm is sinus bradycardia, rate 51 bpm. Challenging image    quality.  ___________  R/LHC 12/10/2015: Coronary angiography:  Coronary dominance: Right or codominant   Left mainstem:   Large vessel that bifurcates into the LAD and left circumflex, no significant disease noted   Left anterior descending (LAD):   Large vessel that extends to the apical region, diagonal branch 2 of moderate size, there is 40% LAD disease after the first diagonal   Left circumflex (LCx):  Large vessel with OM branch 2, no significant disease noted   Right coronary artery (RCA):  Right dominant vessel with PL and PDA, no significant disease noted   Left ventriculography: Left ventricular systolic function severely depressed, LVEF is estimated at 20 to 25 %, diffuse hypokinesis,  there is no significant mitral regurgitation , no significant aortic valve stenosis   Final Conclusions:    Mild proximal LAD disease, otherwise no significant stenoses  Nonischemic cardiomyopathy with severely depressed ejection fraction, dilated LV  -Right heart catheterization showing severely elevated right heart pressures including wedge pressure   Recommendations:  Would continue diuresis given severely elevated right heart pressures and wedge pressure. If blood pressure runs low, or unable to tolerate diuresis, will likely need dobutamine support for diuresis  __________  2D echo 12/08/2015: - Left ventricle: The cavity size was severely dilated. Wall    thickness was normal. Systolic function was severely reduced.  The    estimated ejection fraction was in the range of 20% to 25%.    Diffuse hypokinesis.  - Mitral valve: There was moderate regurgitation.  - Left atrium: The atrium was severely dilated.  - Right ventricle: The cavity size was mildly dilated. Wall    thickness was normal. Systolic function was mildly reduced.  - Right atrium: The atrium was severely dilated.  - Tricuspid valve: There was moderate regurgitation.  - Pulmonary arteries: Systolic pressure was moderately increased.    PA peak pressure: 50 mm Hg (S).    EKG:  EKG is ordered today.  The EKG ordered today demonstrates sinus bradycardia, 55 bpm, rare PAC, no acute ST-T changes  Recent Labs: 11/11/2020: Hemoglobin CANCELED; Platelets CANCELED 05/09/2021: ALT 123; BUN 13; Creatinine, Ser 0.79; Potassium 4.3; Sodium 137  Recent Lipid Panel    Component Value Date/Time   CHOL 225 (H) 05/09/2021 1001   CHOL 276 (H) 11/11/2020 1102   TRIG 154 (H) 05/09/2021 1001   HDL 42 05/09/2021 1001   HDL 34 (L) 11/11/2020 1102   CHOLHDL 5.4 05/09/2021 1001   VLDL 31 05/09/2021 1001   LDLCALC 152 (H) 05/09/2021 1001   LDLCALC 184 (H) 11/11/2020 1102   LDLDIRECT 110 (H) 04/11/2018 1450    PHYSICAL EXAM:    VS:  BP 126/70 (BP Location: Left Arm, Patient Position: Sitting, Cuff Size: Large)   Pulse (!) 55   Ht 6\' 2"  (1.88 m)   Wt 273 lb 8 oz (124.1 kg)   SpO2 98%   BMI 35.12 kg/m   BMI: Body mass index is 35.12 kg/m.  Physical Exam Vitals reviewed.  Constitutional:      Appearance: He is well-developed.  HENT:     Head: Normocephalic and atraumatic.  Eyes:     General:        Right eye: No discharge.        Left eye: No discharge.  Neck:     Vascular: No JVD.  Cardiovascular:     Rate and Rhythm: Normal rate and regular rhythm.     Pulses:          Posterior tibial pulses  are 2+ on the right side and 2+ on the left side.     Heart sounds: Normal heart sounds, S1 normal and S2 normal. Heart sounds not distant. No  midsystolic click and no opening snap. No murmur heard.   No friction rub.  Pulmonary:     Effort: Pulmonary effort is normal. No respiratory distress.     Breath sounds: Normal breath sounds. No decreased breath sounds, wheezing or rales.  Chest:     Chest wall: No tenderness.  Abdominal:     General: There is no distension.     Palpations: Abdomen is soft.     Tenderness: There is no abdominal tenderness.  Musculoskeletal:     Cervical back: Normal range of motion.  Skin:    General: Skin is warm and dry.     Nails: There is no clubbing.  Neurological:     Mental Status: He is alert and oriented to person, place, and time.  Psychiatric:        Speech: Speech normal.        Behavior: Behavior normal.        Thought Content: Thought content normal.        Judgment: Judgment normal.    Wt Readings from Last 3 Encounters:  05/14/21 273 lb 8 oz (124.1 kg)  11/11/20 262 lb (118.8 kg)  04/09/20 249 lb 6 oz (113.1 kg)     ASSESSMENT & PLAN:   Nonobstructive CAD without angina: He is doing well without symptoms concerning for angina.  He remains on Eliquis in place of aspirin to minimize bleeding risk.  We will otherwise continue medications as outlined below.  No indication for further ischemic testing at this time.  HFrEF secondary to NICM: His cardiomyopathy was felt to be in the setting of persistent A. fib.  With restoration of sinus rhythm he has demonstrated normalization of LV systolic function in 03/2016 which remained stable by most recent echo in 04/2020.  He has NYHA class I symptoms.  No evidence of decompensation.  Continue current GDMT including Entresto, Toprol-XL, spironolactone, and Lasix.  CHF education.  Persistent A. fib: Maintaining sinus rhythm without symptoms concerning for recurrence of atrial arrhythmia.  Continue Toprol-XL.  Given a CHA2DS2-VASc at least 3 (CHF, HTN, vascular disease), he remains on Eliquis and is without symptoms concerning for  bleeding.  HTN: Blood pressure well controlled in the office.  He remains on Toprol-XL, Entresto, and spironolactone.  Recent labs demonstrating normal renal function and stable potassium.  HLD: LDL 152 with goal being less than 70.  He does note he has been taking Lipitor and Zetia on a daily basis.  Given abnormal LFTs as outlined below, we are de-escalating antilipid therapy.  If his abnormal liver function improves with this, we may need to consider referral to the lipid clinic for PCSK9i.  Abnormal LFT: Noted on labs earlier this month.  We will stop Zetia and have him decrease atorvastatin to 10 mg daily.  He does report drinking some alcohol on Fridays and Saturdays, his off days.  This may be contributing to abnormal liver function.  He has been advised to decrease alcohol intake.  He denies any significant Tylenol or ibuprofen use.  We will recheck LFT in 2 to 3 months.  CDL evaluation: He is doing well from a cardiac perspective without symptoms concerning for angina or decompensation.  No symptoms of dizziness, presyncope, or syncope.  Last echo from 04/2020 demonstrated normal LV systolic function.  I  do not believe he requires ETT as part of his CDL given he never had an MI and has been demonstrated to have nonobstructive CAD.  From a cardiac perspective, he is cleared to drive.  Disposition: F/u with Dr. Kirke Corin or an APP in 6 months.   Medication Adjustments/Labs and Tests Ordered: Current medicines are reviewed at length with the patient today.  Concerns regarding medicines are outlined above. Medication changes, Labs and Tests ordered today are summarized above and listed in the Patient Instructions accessible in Encounters.   SignedEula Listen, PA-C 05/14/2021 11:02 AM     [facility] [place] [address] [place], [place] [address] [phone]

## 2021-06-06 ENCOUNTER — Other Ambulatory Visit: Payer: Self-pay | Admitting: Cardiovascular Disease

## 2021-06-06 DIAGNOSIS — I428 Other cardiomyopathies: Secondary | ICD-10-CM

## 2021-06-16 ENCOUNTER — Ambulatory Visit: Payer: 59 | Admitting: Physician Assistant

## 2021-11-20 ENCOUNTER — Ambulatory Visit: Payer: 59 | Admitting: Cardiovascular Disease

## 2022-02-10 ENCOUNTER — Other Ambulatory Visit: Payer: Self-pay | Admitting: Family

## 2022-02-10 DIAGNOSIS — I480 Paroxysmal atrial fibrillation: Secondary | ICD-10-CM

## 2022-02-10 DIAGNOSIS — I428 Other cardiomyopathies: Secondary | ICD-10-CM

## 2022-02-10 DIAGNOSIS — Z7901 Long term (current) use of anticoagulants: Secondary | ICD-10-CM

## 2022-02-10 NOTE — Telephone Encounter (Signed)
Prescription refill request for Eliquis received. ?Indication: Afib  ?Last office visit:05/14/21 Shea Evans)  ?Scr:0.79 (05/09/21)  ?Age: 61 ?Weight: 124.1kg ? ?Appropriate dose and refill sent to requested pharmacy.  ?

## 2022-02-13 ENCOUNTER — Ambulatory Visit (INDEPENDENT_AMBULATORY_CARE_PROVIDER_SITE_OTHER): Payer: 59 | Admitting: Cardiovascular Disease

## 2022-02-13 ENCOUNTER — Encounter: Payer: Self-pay | Admitting: Cardiovascular Disease

## 2022-02-13 VITALS — BP 134/72 | HR 69 | Ht 74.0 in | Wt 274.5 lb

## 2022-02-13 DIAGNOSIS — I5022 Chronic systolic (congestive) heart failure: Secondary | ICD-10-CM | POA: Diagnosis not present

## 2022-02-13 DIAGNOSIS — I4819 Other persistent atrial fibrillation: Secondary | ICD-10-CM

## 2022-02-13 DIAGNOSIS — E785 Hyperlipidemia, unspecified: Secondary | ICD-10-CM | POA: Diagnosis not present

## 2022-02-13 MED ORDER — ROSUVASTATIN CALCIUM 10 MG PO TABS: 10.0000 mg | ORAL_TABLET | Freq: Every day | ORAL | 3 refills | Status: DC

## 2022-02-13 NOTE — Patient Instructions (Signed)
Medication Instructions:  ?Your physician has recommended you make the following change in your medication:  ? ?- STOP Atorvastatin ?- STOP Lasix ? ?- START Rosuvastatin (crestor) 10 mg daily. An Rx has been sent to your pharmacy. ? ?*If you need a refill on your cardiac medications before your next appointment, please call your pharmacy* ? ? ?Lab Work: ?Your physician recommends that you return for a FASTING lipid profile, cbc, cmp: in 2 months ? ?Please have your labs drawn at the [facility]. No appt needed. Lab hours are Mon-Fri 7am-6pm ? ?If you have labs (blood work) drawn today and your tests are completely normal, you will receive your results only by: ?MyChart Message (if you have MyChart) OR ?A paper copy in the mail ?If you have any lab test that is abnormal or we need to change your treatment, we will call you to review the results. ? ? ?Testing/Procedures: ?None ordered ? ? ?Follow-Up: ?At [facility], you and your health needs are our priority.  As part of our continuing mission to provide you with exceptional heart care, we have created designated Provider Care Teams.  These Care Teams include your primary Cardiologist (physician) and Advanced Practice Providers (APPs -  Physician Assistants and Nurse Practitioners) who all work together to provide you with the care you need, when you need it. ? ?We recommend signing up for the patient portal called "MyChart".  Sign up information is provided on this After Visit Summary.  MyChart is used to connect with patients for Virtual Visits (Telemedicine).  Patients are able to view lab/test results, encounter notes, upcoming appointments, etc.  Non-urgent messages can be sent to your provider as well.   ?To learn more about what you can do with MyChart, go to [email or web address].   ? ?Your next appointment:   ?6 month(s) ? ?The format for your next appointment:   ?In Person ? ?Provider:   ?You may see Lorine Bears, MD or one of the following  Advanced Practice Providers on your designated Care Team:   ?Nicolasa Ducking, NP ?Eula Listen, PA-C ?Cadence Fransico Michael, PA-C ? ? ?Other Instructions ?N/A ? ?Important Information About Sugar ? ? ? ? ? ? ?

## 2022-02-13 NOTE — Progress Notes (Signed)
?  ?Cardiology Office Note ? ? ?Date:  02/13/2022  ? ?ID:  Walter Banks, DOB [birth year], MRN [id number] ? ?PCP:  Pcp, No  ?Cardiologist:   Lorine Bears, MD  ? ?Chief Complaint  ?Patient presents with  ? Other  ?  6 Month f/u no complaints today. Meds reviewed verbally with pt.  ? ? ?  ?History of Present Illness: ?Walter Banks is a 61 y.o. male who presents for a follow-up visit regarding chronic systolic heart failure due to nonischemic cardiomyopathy, persistent atrial fibrillation status post cardioversion and moderate nonobstructive one-vessel coronary artery disease.  ?The patient was hospitalized at [facility] in March, 2017 with congestive heart failure and atrial fibrillation.  Echo showed EF 20-25%, diffuse HK, moderate MR, left atrium severely dilated at 51 mm, RV mildly dilated with mildly reduced systolic function, RA severely dilated, PASP 50 mm Hg. Right and left cardiac cath showed moderate proximal LAD disease with 40% stenosis otherwise no significant stenosis. Right heart cath showed severely elevated right heart pressures including wedge pressure.  ?He underwent successful cardioversion to sinus rhythm.  ?Ejection fraction normalized after that.  Most recent echo in 2021 showed an ejection fraction of 55% with minimal pulmonary hypertension. ? ?He has been doing very well with no chest pain, shortness of breath or palpitations. ? ? ? ?Past Medical History:  ?Diagnosis Date  ? Chronic systolic CHF (congestive heart failure) (HCC)   ? a. 12/08/2015 Echo: EF of 20-25% w/ diffuse hypokinesis, severely dilated LA and RA, moderate MR and TR, PA peak pressure of 50 mmHg; b. 03/2016 Echo: EF 50-55%, no rwma, mildly dil LA, nl RV fxn; c. 04/2018 Echo: EF 55-60%, mild LVH. Triv AI. Mildly dil Ao arch. Mildly dil LA/RV/RA. PASP .  ? H. pylori infection   ? History of pneumonia   ? Mitral regurgitation   ? a. 12/2015 Echo: Mod MR;  b. 03/2016 Echo: No MR (EF normalized).  ? NICM (nonischemic  cardiomyopathy) (HCC)   ? a. cardiac cath 12/10/15: pLAD 40%, o/w no stenosis, severely elevated right heart pressures;  b. 03/2016 Echo: EF 50-55%; c. 04/2018 Echo: EF 55-60%.  ? PAF (paroxysmal atrial fibrillation) (HCC)   ? a. Dx 12/2015;  b. s/p DCCV 01/2016;  c. on Eliquis [CHADS2VASc = 1 (CHF)]  ? VT (ventricular tachycardia) (HCC)   ? a. 38 beat run during admission 12/2015-->associated with flushing.  ? ? ?Past Surgical History:  ?Procedure Laterality Date  ? CARDIAC CATHETERIZATION N/A 12/10/2015  ? Procedure: Right and Left Heart Cath;  Surgeon: Antonieta Iba, MD;  Location: [facility] INVASIVE CV LAB;  Service: Cardiovascular;  Laterality: N/A;  ? CARDIOVERSION N/A 03/26/2020  ? Procedure: CARDIOVERSION;  Surgeon: Yvonne Kendall, MD;  Location: [facility] ORS;  Service: Cardiovascular;  Laterality: N/A;  ? ELECTROPHYSIOLOGIC STUDY N/A 01/10/2016  ? Procedure: CARDIOVERSION;  Surgeon: Iran Ouch, MD;  Location: [facility] ORS;  Service: Cardiovascular;  Laterality: N/A;  ? WRIST SURGERY Left 2013  ? ? ? ?Current Outpatient Medications  ?Medication Sig Dispense Refill  ? apixaban (ELIQUIS) 5 MG TABS tablet Take 1 tablet by mouth twice daily 180 tablet 1  ? atorvastatin (LIPITOR) 10 MG tablet Take 1 tablet (10 mg total) by mouth daily. 90 tablet 3  ? furosemide (LASIX) 20 MG tablet Take 1 tablet (20 mg total) by mouth every other day. 45 tablet 1  ? metoprolol succinate (TOPROL-XL) 50 MG 24 hr tablet TAKE 1 TABLET BY MOUTH ONCE DAILY (TAKE  WITH  OR  IMMEDIATELY  FOLLOWING  A  MEAL) 90 tablet 3  ? multivitamin (ONE-A-DAY MEN'S) TABS tablet Take 1 tablet by mouth daily.    ? sacubitril-valsartan (ENTRESTO) 24-26 MG Take 1 tablet by mouth 2 (two) times daily. NEED APPOINTMENT 180 tablet 0  ? spironolactone (ALDACTONE) 25 MG tablet Take 1 tablet by mouth once daily 90 tablet 3  ? ?No current facility-administered medications for this visit.  ? ? ?Allergies:   Patient has no known allergies.  ? ? ?Social History:  The patient   reports that he has never smoked. He has never used smokeless tobacco. He reports current alcohol use of about 8.0 standard drinks per week. He reports that he does not use drugs.  ? ?Family History:  The patient's family history includes Diabetes in his brother, father, and mother; Heart attack in his father; Heart disease in his mother; Heart failure in his sister; Hypertension in his father.  ? ? ?ROS:  Please see the history of present illness.   Otherwise, review of systems are positive for none.   All other systems are reviewed and negative.  ? ? ?PHYSICAL EXAM: ?VS:  BP 134/72 (BP Location: Left Arm, Patient Position: Sitting, Cuff Size: Normal)   Pulse 69   Ht 6\' 2"  (1.88 m)   Wt 274 lb 8 oz (124.5 kg)   SpO2 97%   BMI 35.24 kg/m?  , BMI Body mass index is 35.24 kg/m?. ?GEN: Well nourished, well developed, in no acute distress ?HEENT: normal ?Neck: no JVD, carotid bruits, or masses ?Cardiac: RRR; no murmurs, rubs, or gallops,no edema  ?Respiratory:  clear to auscultation bilaterally, normal work of breathing ?GI: soft, nontender, nondistended, + BS ?MS: no deformity or atrophy ?Skin: warm and dry, no rash ?Neuro:  Strength and sensation are intact ?Psych: euthymic mood, full affect ? ? ?EKG:  EKG is ordered today. ?The ekg ordered today demonstrates normal sinus rhythm with sinus arrhythmia.  Left axis deviation. ? ?Recent Labs: ?05/09/2021: ALT 123; BUN 13; Creatinine, Ser 0.79; Potassium 4.3; Sodium 137  ? ? ?Lipid Panel ?   ?Component Value Date/Time  ? CHOL 225 (H) 05/09/2021 1001  ? CHOL 276 (H) 11/11/2020 1102  ? TRIG 154 (H) 05/09/2021 1001  ? HDL 42 05/09/2021 1001  ? HDL 34 (L) 11/11/2020 1102  ? CHOLHDL 5.4 05/09/2021 1001  ? VLDL 31 05/09/2021 1001  ? LDLCALC 152 (H) 05/09/2021 1001  ? LDLCALC 184 (H) 11/11/2020 1102  ? LDLDIRECT 110 (H) 04/11/2018 1450  ? ?  ? ?Wt Readings from Last 3 Encounters:  ?02/13/22 274 lb 8 oz (124.5 kg)  ?05/14/21 273 lb 8 oz (124.1 kg)  ?11/11/20 262 lb (118.8 kg)   ?  ? ? ? ?ASSESSMENT AND PLAN: ? ?1.  Chronic systolic heart failure: Due to nonischemic cardiomyopathy most likely tachycardia-induced.  Ejection fraction improved to normal after restoration of sinus rhythm currently in [place] heart association class I.  No evidence of volume overload.  I discontinued furosemide. ? ?2. Persistent atrial fibrillation: He continues to be in sinus rhythm. Continue anticoagulation.  Check CBC in 2 months. ? ?3. Moderate one vessel coronary artery disease: No anginal symptoms. Continue medical therapy. ? ?4. Hyperlipidemia: The patient had elevation in LFTs with atorvastatin.  He does not consume excessive amounts of alcohol.  He drinks some on the weekends.  I elected to switch him to rosuvastatin 10 mg daily.  Repeat lipid and liver profile in 2  months. ? ?5.  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  No further testing is required from a cardiac standpoint and the patient can renew. ? ? ?Disposition:   FU with me in 6 months.  ? ?Signed, ? ?Lorine Bears, MD  ?02/13/2022 8:09 AM    ?[facility] ?

## 2022-02-19 ENCOUNTER — Telehealth: Payer: Self-pay | Admitting: Cardiovascular Disease

## 2022-02-19 NOTE — Telephone Encounter (Signed)
Pt c/o medication issue:  1. Name of Medication:  apixaban (ELIQUIS) 5 MG TABS tablet sacubitril-valsartan (ENTRESTO) 24-26 MG  2. How are you currently taking this medication (dosage and times per day)? 1 tablet twice a day  3. Are you having a reaction (difficulty breathing--STAT)? bo  4. What is your medication issue? Patient requesting coupons for the medication.

## 2022-02-20 NOTE — Telephone Encounter (Signed)
Spoke with the patient. Adv him that the only coupons we have are for a free 30 day for Eliquis and Entresto. Adv the pt that these coupons are a one time use. Pt confirms that he has not used free 30 days coupons prior. 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 Pt rqst that the coupons be mailed to his home. Pt home address confirmed. Pt voiced appreciation for the assistance.

## 2022-07-04 DIAGNOSIS — E669 Obesity, unspecified: Secondary | ICD-10-CM | POA: Diagnosis not present

## 2022-07-04 DIAGNOSIS — E785 Hyperlipidemia, unspecified: Secondary | ICD-10-CM | POA: Diagnosis not present

## 2022-07-04 DIAGNOSIS — I4891 Unspecified atrial fibrillation: Secondary | ICD-10-CM | POA: Diagnosis not present

## 2022-07-04 DIAGNOSIS — Z6832 Body mass index (BMI) 32.0-32.9, adult: Secondary | ICD-10-CM | POA: Diagnosis not present

## 2022-07-04 DIAGNOSIS — I509 Heart failure, unspecified: Secondary | ICD-10-CM | POA: Diagnosis not present

## 2022-07-04 DIAGNOSIS — Z833 Family history of diabetes mellitus: Secondary | ICD-10-CM | POA: Diagnosis not present

## 2022-07-04 DIAGNOSIS — Z7901 Long term (current) use of anticoagulants: Secondary | ICD-10-CM | POA: Diagnosis not present

## 2022-07-04 DIAGNOSIS — D6869 Other thrombophilia: Secondary | ICD-10-CM | POA: Diagnosis not present

## 2022-07-04 DIAGNOSIS — I110 Hypertensive heart disease with heart failure: Secondary | ICD-10-CM | POA: Diagnosis not present

## 2022-07-22 ENCOUNTER — Emergency Department (HOSPITAL_COMMUNITY): Payer: 59

## 2022-07-22 ENCOUNTER — Other Ambulatory Visit: Payer: Self-pay

## 2022-07-22 ENCOUNTER — Inpatient Hospital Stay (HOSPITAL_COMMUNITY)
Admission: EM | Admit: 2022-07-22 | Discharge: 2022-07-24 | DRG: 083 | Disposition: A | Discharge status: Home / Self Care | Payer: 59 | Attending: General Surgery | Admitting: General Surgery

## 2022-07-22 ENCOUNTER — Encounter (HOSPITAL_COMMUNITY): Payer: Self-pay | Admitting: Emergency Medicine

## 2022-07-22 DIAGNOSIS — S81011A Laceration without foreign body, right knee, initial encounter: Secondary | ICD-10-CM | POA: Diagnosis present

## 2022-07-22 DIAGNOSIS — M7989 Other specified soft tissue disorders: Secondary | ICD-10-CM | POA: Diagnosis not present

## 2022-07-22 DIAGNOSIS — S066X0A Traumatic subarachnoid hemorrhage without loss of consciousness, initial encounter: Secondary | ICD-10-CM | POA: Diagnosis not present

## 2022-07-22 DIAGNOSIS — R918 Other nonspecific abnormal finding of lung field: Secondary | ICD-10-CM | POA: Diagnosis not present

## 2022-07-22 DIAGNOSIS — I609 Nontraumatic subarachnoid hemorrhage, unspecified: Secondary | ICD-10-CM | POA: Diagnosis not present

## 2022-07-22 DIAGNOSIS — Z8249 Family history of ischemic heart disease and other diseases of the circulatory system: Secondary | ICD-10-CM

## 2022-07-22 DIAGNOSIS — I5022 Chronic systolic (congestive) heart failure: Secondary | ICD-10-CM | POA: Diagnosis present

## 2022-07-22 DIAGNOSIS — S0231XA Fracture of orbital floor, right side, initial encounter for closed fracture: Secondary | ICD-10-CM | POA: Diagnosis not present

## 2022-07-22 DIAGNOSIS — Z041 Encounter for examination and observation following transport accident: Secondary | ICD-10-CM | POA: Diagnosis not present

## 2022-07-22 DIAGNOSIS — S066XAA Traumatic subarachnoid hemorrhage with loss of consciousness status unknown, initial encounter: Secondary | ICD-10-CM | POA: Diagnosis not present

## 2022-07-22 DIAGNOSIS — S12000A Unspecified displaced fracture of first cervical vertebra, initial encounter for closed fracture: Secondary | ICD-10-CM | POA: Diagnosis present

## 2022-07-22 DIAGNOSIS — S0240CA Maxillary fracture, right side, initial encounter for closed fracture: Secondary | ICD-10-CM | POA: Diagnosis present

## 2022-07-22 DIAGNOSIS — I340 Nonrheumatic mitral (valve) insufficiency: Secondary | ICD-10-CM | POA: Diagnosis not present

## 2022-07-22 DIAGNOSIS — I428 Other cardiomyopathies: Secondary | ICD-10-CM | POA: Diagnosis not present

## 2022-07-22 DIAGNOSIS — R402362 Coma scale, best motor response, obeys commands, at arrival to emergency department: Secondary | ICD-10-CM | POA: Diagnosis not present

## 2022-07-22 DIAGNOSIS — R410 Disorientation, unspecified: Secondary | ICD-10-CM | POA: Diagnosis not present

## 2022-07-22 DIAGNOSIS — R402242 Coma scale, best verbal response, confused conversation, at arrival to emergency department: Secondary | ICD-10-CM | POA: Diagnosis not present

## 2022-07-22 DIAGNOSIS — S022XXA Fracture of nasal bones, initial encounter for closed fracture: Secondary | ICD-10-CM | POA: Diagnosis not present

## 2022-07-22 DIAGNOSIS — S6991XA Unspecified injury of right wrist, hand and finger(s), initial encounter: Secondary | ICD-10-CM | POA: Diagnosis not present

## 2022-07-22 DIAGNOSIS — I420 Dilated cardiomyopathy: Secondary | ICD-10-CM | POA: Diagnosis not present

## 2022-07-22 DIAGNOSIS — T1490XA Injury, unspecified, initial encounter: Secondary | ICD-10-CM | POA: Diagnosis not present

## 2022-07-22 DIAGNOSIS — S0291XA Unspecified fracture of skull, initial encounter for closed fracture: Secondary | ICD-10-CM | POA: Diagnosis not present

## 2022-07-22 DIAGNOSIS — S27321A Contusion of lung, unilateral, initial encounter: Secondary | ICD-10-CM | POA: Diagnosis not present

## 2022-07-22 DIAGNOSIS — Y92410 Unspecified street and highway as the place of occurrence of the external cause: Secondary | ICD-10-CM | POA: Diagnosis not present

## 2022-07-22 DIAGNOSIS — S12090A Other displaced fracture of first cervical vertebra, initial encounter for closed fracture: Secondary | ICD-10-CM | POA: Diagnosis not present

## 2022-07-22 DIAGNOSIS — Z23 Encounter for immunization: Secondary | ICD-10-CM | POA: Diagnosis not present

## 2022-07-22 DIAGNOSIS — I480 Paroxysmal atrial fibrillation: Secondary | ICD-10-CM | POA: Diagnosis not present

## 2022-07-22 DIAGNOSIS — R9431 Abnormal electrocardiogram [ECG] [EKG]: Secondary | ICD-10-CM | POA: Diagnosis not present

## 2022-07-22 DIAGNOSIS — R58 Hemorrhage, not elsewhere classified: Secondary | ICD-10-CM | POA: Diagnosis not present

## 2022-07-22 DIAGNOSIS — Z7901 Long term (current) use of anticoagulants: Secondary | ICD-10-CM

## 2022-07-22 DIAGNOSIS — S0292XA Unspecified fracture of facial bones, initial encounter for closed fracture: Secondary | ICD-10-CM | POA: Diagnosis not present

## 2022-07-22 DIAGNOSIS — R402142 Coma scale, eyes open, spontaneous, at arrival to emergency department: Secondary | ICD-10-CM | POA: Diagnosis not present

## 2022-07-22 DIAGNOSIS — I110 Hypertensive heart disease with heart failure: Secondary | ICD-10-CM | POA: Diagnosis not present

## 2022-07-22 DIAGNOSIS — S066X9A Traumatic subarachnoid hemorrhage with loss of consciousness of unspecified duration, initial encounter: Secondary | ICD-10-CM | POA: Diagnosis not present

## 2022-07-22 DIAGNOSIS — Z743 Need for continuous supervision: Secondary | ICD-10-CM | POA: Diagnosis not present

## 2022-07-22 DIAGNOSIS — R0902 Hypoxemia: Secondary | ICD-10-CM | POA: Diagnosis not present

## 2022-07-22 DIAGNOSIS — Z8679 Personal history of other diseases of the circulatory system: Secondary | ICD-10-CM | POA: Diagnosis not present

## 2022-07-22 DIAGNOSIS — S3993XA Unspecified injury of pelvis, initial encounter: Secondary | ICD-10-CM | POA: Diagnosis not present

## 2022-07-22 LAB — COMPREHENSIVE METABOLIC PANEL
ALT: 55 U/L — ABNORMAL HIGH (ref 0–44)
AST: 46 U/L — ABNORMAL HIGH (ref 15–41)
Albumin: 4.3 g/dL (ref 3.5–5.0)
Alkaline Phosphatase: 66 U/L (ref 38–126)
Anion gap: 13 (ref 5–15)
BUN: 16 mg/dL (ref 8–23)
CO2: 24 mmol/L (ref 22–32)
Calcium: 8.6 mg/dL — ABNORMAL LOW (ref 8.9–10.3)
Chloride: 104 mmol/L (ref 98–111)
Creatinine, Ser: 0.97 mg/dL (ref 0.61–1.24)
GFR, Estimated: >60 mL/min (ref >60)
Glucose, Bld: 154 mg/dL — ABNORMAL HIGH (ref 70–99)
Potassium: 3.6 mmol/L (ref 3.5–5.1)
Sodium: 141 mmol/L (ref 135–145)
Total Bilirubin: 0.3 mg/dL (ref 0.3–1.2)
Total Protein: 7 g/dL (ref 6.5–8.1)

## 2022-07-22 LAB — PROTIME-INR
INR: 1 (ref 0.8–1.2)
Prothrombin Time: 12.9 seconds (ref 11.4–15.2)

## 2022-07-22 LAB — ETHANOL: Alcohol, Ethyl (B): 196 mg/dL — ABNORMAL HIGH (ref <10)

## 2022-07-22 LAB — I-STAT CHEM 8, ED
BUN: 18 mg/dL (ref 8–23)
Calcium, Ion: 1.06 mmol/L — ABNORMAL LOW (ref 1.15–1.40)
Chloride: 101 mmol/L (ref 98–111)
Creatinine, Ser: 1.2 mg/dL (ref 0.61–1.24)
Glucose, Bld: 151 mg/dL — ABNORMAL HIGH (ref 70–99)
HCT: 44 % (ref 39.0–52.0)
Hemoglobin: 15 g/dL (ref 13.0–17.0)
Potassium: 3.7 mmol/L (ref 3.5–5.1)
Sodium: 141 mmol/L (ref 135–145)
TCO2: 24 mmol/L (ref 22–32)

## 2022-07-22 LAB — CBC
HCT: 41.9 % (ref 39.0–52.0)
Hemoglobin: 14.1 g/dL (ref 13.0–17.0)
MCH: 31.8 pg (ref 26.0–34.0)
MCHC: 33.7 g/dL (ref 30.0–36.0)
MCV: 94.4 fL (ref 80.0–100.0)
Platelets: 189 10*3/uL (ref 150–400)
RBC: 4.44 MIL/uL (ref 4.22–5.81)
RDW: 12.6 % (ref 11.5–15.5)
WBC: 8.8 10*3/uL (ref 4.0–10.5)
nRBC: 0 % (ref 0.0–0.2)

## 2022-07-22 LAB — SAMPLE TO BLOOD BANK

## 2022-07-22 LAB — LACTIC ACID, PLASMA: Lactic Acid, Venous: 2.4 mmol/L (ref 0.5–1.9)

## 2022-07-22 MED ORDER — MORPHINE SULFATE (PF) 4 MG/ML IV SOLN
4.0000 mg | INTRAVENOUS | Status: DC | PRN
  Administered 2022-07-23: 4 mg via INTRAVENOUS
  Filled 2022-07-22: qty 1

## 2022-07-22 MED ORDER — LIDOCAINE HCL (PF) 1 % IJ SOLN
30.0000 mL | Freq: Once | INTRAMUSCULAR | Status: DC
  Filled 2022-07-22: qty 30

## 2022-07-22 MED ORDER — SODIUM CHLORIDE 0.9 % IV BOLUS
1000.0000 mL | Freq: Once | INTRAVENOUS | Status: AC
  Administered 2022-07-22: 1000 mL via INTRAVENOUS

## 2022-07-22 MED ORDER — IOHEXOL 350 MG/ML SOLN
75.0000 mL | Freq: Once | INTRAVENOUS | Status: AC | PRN
  Administered 2022-07-22: 75 mL via INTRAVENOUS

## 2022-07-22 MED ORDER — SODIUM CHLORIDE 0.9 % IV SOLN: INTRAVENOUS | Status: DC | PRN

## 2022-07-22 MED ORDER — TETANUS-DIPHTH-ACELL PERTUSSIS 5-2.5-18.5 LF-MCG/0.5 IM SUSY
0.5000 mL | PREFILLED_SYRINGE | Freq: Once | INTRAMUSCULAR | Status: AC
  Administered 2022-07-24: 0.5 mL via INTRAMUSCULAR
  Filled 2022-07-22: qty 0.5

## 2022-07-22 MED ORDER — ONDANSETRON 4 MG PO TBDP: 4.0000 mg | ORAL_TABLET | Freq: Four times a day (QID) | ORAL | Status: DC | PRN

## 2022-07-22 MED ORDER — EMPTY CONTAINERS FLEXIBLE MISC
900.0000 mg | Freq: Once | Status: AC
  Administered 2022-07-22: 900 mg via INTRAVENOUS
  Filled 2022-07-22: qty 90

## 2022-07-22 MED ORDER — LEVETIRACETAM IN NACL 500 MG/100ML IV SOLN
500.0000 mg | Freq: Two times a day (BID) | INTRAVENOUS | Status: DC
  Administered 2022-07-22 – 2022-07-24 (×4): 500 mg via INTRAVENOUS
  Filled 2022-07-22 (×4): qty 100

## 2022-07-22 MED ORDER — SODIUM CHLORIDE 0.9 % IV SOLN: INTRAVENOUS | Status: DC

## 2022-07-22 MED ORDER — ONDANSETRON HCL 4 MG/2ML IJ SOLN: 4.0000 mg | Freq: Four times a day (QID) | INTRAMUSCULAR | Status: DC | PRN

## 2022-07-22 MED ORDER — ACETAMINOPHEN 500 MG PO TABS
1000.0000 mg | ORAL_TABLET | Freq: Four times a day (QID) | ORAL | Status: DC
  Administered 2022-07-22 – 2022-07-24 (×7): 1000 mg via ORAL
  Filled 2022-07-22 (×7): qty 2

## 2022-07-22 MED ORDER — OXYCODONE HCL 5 MG PO TABS
5.0000 mg | ORAL_TABLET | ORAL | Status: DC | PRN
  Administered 2022-07-23: 10 mg via ORAL
  Filled 2022-07-22: qty 2

## 2022-07-22 MED ORDER — DOCUSATE SODIUM 100 MG PO CAPS
100.0000 mg | ORAL_CAPSULE | Freq: Two times a day (BID) | ORAL | Status: DC
  Administered 2022-07-22 – 2022-07-24 (×4): 100 mg via ORAL
  Filled 2022-07-22 (×4): qty 1

## 2022-07-22 MED ORDER — NOREPINEPHRINE 4 MG/250ML-% IV SOLN
0.0000 ug/min | INTRAVENOUS | Status: DC
  Administered 2022-07-22: 2 ug/min via INTRAVENOUS
  Filled 2022-07-22: qty 250

## 2022-07-22 MED ORDER — METHOCARBAMOL 500 MG PO TABS
1000.0000 mg | ORAL_TABLET | Freq: Three times a day (TID) | ORAL | Status: DC
  Administered 2022-07-22 – 2022-07-24 (×6): 1000 mg via ORAL
  Filled 2022-07-22 (×6): qty 2

## 2022-07-22 MED ORDER — LIDOCAINE HCL (PF) 1 % IJ SOLN
INTRAMUSCULAR | Status: AC
  Filled 2022-07-22: qty 30

## 2022-07-22 NOTE — Progress Notes (Signed)
Orthopedic Tech Progress Note Patient Details:  Walter Banks July 10, 1961 [id number]  Patient ID: Walter Banks, male   DOB: [birth year], 61 y.o.   MRN: [id number] Level II; not currently needed. Vernona Rieger 07/22/2022, 9:08 PM
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
## 2022-07-23 ENCOUNTER — Inpatient Hospital Stay (HOSPITAL_COMMUNITY): Payer: 59

## 2022-07-23 LAB — URINALYSIS, ROUTINE W REFLEX MICROSCOPIC
Bacteria, UA: NONE SEEN
Bilirubin Urine: NEGATIVE
Glucose, UA: NEGATIVE mg/dL
Ketones, ur: 5 mg/dL — AB
Leukocytes,Ua: NEGATIVE
Nitrite: NEGATIVE
Protein, ur: 30 mg/dL — AB
Specific Gravity, Urine: 1.04 — ABNORMAL HIGH (ref 1.005–1.030)
pH: 5 (ref 5.0–8.0)

## 2022-07-23 LAB — MRSA NEXT GEN BY PCR, NASAL: MRSA by PCR Next Gen: NOT DETECTED

## 2022-07-23 MED ORDER — THIAMINE MONONITRATE 100 MG PO TABS
100.0000 mg | ORAL_TABLET | Freq: Every day | ORAL | Status: DC
  Administered 2022-07-23 – 2022-07-24 (×2): 100 mg via ORAL
  Filled 2022-07-23 (×2): qty 1

## 2022-07-23 MED ORDER — LORAZEPAM 1 MG PO TABS: 1.0000 mg | ORAL_TABLET | ORAL | Status: DC | PRN

## 2022-07-23 MED ORDER — THIAMINE HCL 100 MG/ML IJ SOLN: 100.0000 mg | Freq: Every day | INTRAMUSCULAR | Status: DC

## 2022-07-23 MED ORDER — CHLORHEXIDINE GLUCONATE CLOTH 2 % EX PADS
6.0000 | MEDICATED_PAD | Freq: Every day | CUTANEOUS | Status: DC
  Administered 2022-07-23 – 2022-07-24 (×3): 6 via TOPICAL

## 2022-07-23 MED ORDER — ORAL CARE MOUTH RINSE: 15.0000 mL | OROMUCOSAL | Status: DC | PRN

## 2022-07-23 MED ORDER — LORAZEPAM 2 MG/ML IJ SOLN: 1.0000 mg | INTRAMUSCULAR | Status: DC | PRN

## 2022-07-23 MED ORDER — FOLIC ACID 1 MG PO TABS
1.0000 mg | ORAL_TABLET | Freq: Every day | ORAL | Status: DC
  Administered 2022-07-23 – 2022-07-24 (×2): 1 mg via ORAL
  Filled 2022-07-23 (×2): qty 1

## 2022-07-23 MED ORDER — ADULT MULTIVITAMIN W/MINERALS CH
1.0000 | ORAL_TABLET | Freq: Every day | ORAL | Status: DC
  Administered 2022-07-23 – 2022-07-24 (×2): 1 via ORAL
  Filled 2022-07-23 (×2): qty 1
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
## 2022-07-23 NOTE — Procedures (Signed)
Arterial Catheter Insertion Procedure Note  Walter Banks  [id number]  1960/11/25  Date:07/23/22  Time:12:16 AM    Provider Performing: Dimple Nanas    Procedure: Insertion of Arterial Line [phone]) without US guidance  Indication(s) Blood pressure monitoring and/or need for frequent ABGs  Consent Unable to obtain consent due to emergent nature of procedure.  Anesthesia None   Time Out Verified patient identification, verified procedure, site/side was marked, verified correct patient position, special equipment/implants available, medications/allergies/relevant history reviewed, required imaging and test results available.   Sterile Technique Maximal sterile technique including full sterile barrier drape, hand hygiene, sterile gown, sterile gloves, mask, hair covering, sterile ultrasound probe cover (if used).   Procedure Description Area of catheter insertion was cleaned with chlorhexidine and draped in sterile fashion. With real-time ultrasound guidance an arterial catheter was placed into the right radial artery.  Appropriate arterial tracings confirmed on monitor.     Complications/Tolerance None; patient tolerated the procedure well.   EBL Minimal   Specimen(s) None
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
## 2022-07-24 LAB — CBC
HCT: 36.3 % — ABNORMAL LOW (ref 39.0–52.0)
Hemoglobin: 11.8 g/dL — ABNORMAL LOW (ref 13.0–17.0)
MCH: 30.8 pg (ref 26.0–34.0)
MCHC: 32.5 g/dL (ref 30.0–36.0)
MCV: 94.8 fL (ref 80.0–100.0)
Platelets: 158 10*3/uL (ref 150–400)
RBC: 3.83 MIL/uL — ABNORMAL LOW (ref 4.22–5.81)
RDW: 13.1 % (ref 11.5–15.5)
WBC: 9.5 10*3/uL (ref 4.0–10.5)
nRBC: 0 % (ref 0.0–0.2)

## 2022-07-24 LAB — BASIC METABOLIC PANEL
Anion gap: 9 (ref 5–15)
BUN: 13 mg/dL (ref 8–23)
CO2: 26 mmol/L (ref 22–32)
Calcium: 8.5 mg/dL — ABNORMAL LOW (ref 8.9–10.3)
Chloride: 104 mmol/L (ref 98–111)
Creatinine, Ser: 0.88 mg/dL (ref 0.61–1.24)
GFR, Estimated: >60 mL/min (ref >60)
Glucose, Bld: 145 mg/dL — ABNORMAL HIGH (ref 70–99)
Potassium: 3.8 mmol/L (ref 3.5–5.1)
Sodium: 139 mmol/L (ref 135–145)

## 2022-07-24 LAB — HIV ANTIBODY (ROUTINE TESTING W REFLEX): HIV Screen 4th Generation wRfx: NONREACTIVE

## 2022-07-24 MED ORDER — LEVETIRACETAM 500 MG PO TABS: 500.0000 mg | ORAL_TABLET | Freq: Two times a day (BID) | ORAL | Status: DC

## 2022-07-24 MED ORDER — LEVETIRACETAM 500 MG PO TABS: 500.0000 mg | ORAL_TABLET | Freq: Two times a day (BID) | ORAL | 0 refills | Status: DC

## 2022-07-24 MED ORDER — METHOCARBAMOL 500 MG PO TABS: 500.0000 mg | ORAL_TABLET | Freq: Three times a day (TID) | ORAL | 0 refills | Status: DC | PRN

## 2022-07-24 MED ORDER — ACETAMINOPHEN 500 MG PO TABS: 1000.0000 mg | ORAL_TABLET | Freq: Four times a day (QID) | ORAL | 0 refills | Status: DC | PRN

## 2022-07-24 MED ORDER — OXYCODONE HCL 5 MG PO TABS: 5.0000 mg | ORAL_TABLET | ORAL | 0 refills | Status: DC | PRN
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
## 2022-07-24 NOTE — Discharge Instructions (Signed)
Resume Eliquis in 2 weeks on August 05, 2022.  Do NOT take this before that time.

## 2022-07-24 NOTE — TOC Transition Note (Signed)
Transition of Care [facility]) - CM/SW Discharge Note   Patient Details  Name: Walter Banks MRN: [id number] Date of Birth: 01/28/1961  [facility]) CM/SW Contact:  Ella Bodo, RN Phone Number: 07/24/2022, 12:48 PM   Clinical Narrative:    Met with patient and his two adult children from [place]; patient progressing well, has worked with PT/OT and ST.  PT/OT recommending OP follow up, and patient agreeable to OP therapy referral.  Referral to [facility] for follow up.  He states his friend that will be assisting him at home has a RW that he can use, if needed.  Possible dc later today.  No additional discharge needs identified.    Final next level of care: OP Rehab Barriers to Discharge: Barriers Resolved      Discharge Plan and Services   Discharge Planning Services: CM Consult                                 Social Determinants of Health (SDOH) Interventions     Readmission Risk Interventions     No data to display         Reinaldo Raddle, RN, BSN  Trauma/Neuro ICU Case Manager [phone]
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
## 2022-07-27 ENCOUNTER — Telehealth: Payer: Self-pay | Admitting: Cardiovascular Disease
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
## 2022-07-27 NOTE — Telephone Encounter (Signed)
Patient is calling to talk with Dr. Fletcher Anon or nurse in regards to the medication oxyCODONE (OXY IR/ROXICODONE) 5 MG immediate release tablet
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
## 2022-07-27 NOTE — Telephone Encounter (Signed)
Melissa from [facility] made aware as pt's cardiologist, he can only prescribe cardiac medication. Melissa verbalized understanding.

## 2022-07-27 NOTE — Telephone Encounter (Signed)
Will forward to Dr Fletcher Anon for review, unable to reach patient to let him know

## 2022-07-27 NOTE — Telephone Encounter (Signed)
Please advise 

## 2022-07-29 NOTE — Telephone Encounter (Signed)
I agree with Teri's reply.  I do not prescribe narcotic medications unless its short term related to a procedure I do.  He needs to establish with a primary care physician in addition to [facility].

## 2022-08-06 ENCOUNTER — Telehealth: Payer: Self-pay

## 2022-08-06 NOTE — Telephone Encounter (Signed)
Left message for patient to call back to move up his new patient appointment with Karl Ito.  Please reschedule patient to any 40 minute slot available first available. Thank you
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
## 2022-08-21 ENCOUNTER — Ambulatory Visit: Payer: 59 | Admitting: Cardiovascular Disease

## 2022-09-04 ENCOUNTER — Ambulatory Visit: Payer: 59 | Admitting: Nurse Practitioner

## 2022-09-15 ENCOUNTER — Other Ambulatory Visit: Payer: Self-pay | Admitting: Cardiovascular Disease

## 2022-09-15 DIAGNOSIS — I428 Other cardiomyopathies: Secondary | ICD-10-CM

## 2022-09-16 NOTE — Telephone Encounter (Signed)
Good Morning,  Is there any way you could contact this patient for a 6 month follow up visit? This patient was last seen by Dr. Kirke Corin on 02/13/22 and he wanted him to come back in 6 months. Thank you so much.

## 2022-09-17 NOTE — Telephone Encounter (Signed)
Spironolactone 25 mg # 30 x 2 refills sent to  [facility] [address], [place] - [address]

## 2023-06-01 ENCOUNTER — Ambulatory Visit: Payer: BC Managed Care – PPO | Attending: Medical | Admitting: Medical

## 2023-06-01 ENCOUNTER — Other Ambulatory Visit: Payer: Self-pay

## 2023-06-01 ENCOUNTER — Encounter: Payer: Self-pay | Admitting: Medical

## 2023-06-01 VITALS — BP 132/85 | HR 89 | Ht 73.0 in | Wt 242.0 lb

## 2023-06-01 DIAGNOSIS — I2510 Atherosclerotic heart disease of native coronary artery without angina pectoris: Secondary | ICD-10-CM

## 2023-06-01 DIAGNOSIS — I4819 Other persistent atrial fibrillation: Secondary | ICD-10-CM

## 2023-06-01 DIAGNOSIS — I428 Other cardiomyopathies: Secondary | ICD-10-CM | POA: Diagnosis not present

## 2023-06-01 DIAGNOSIS — Z79899 Other long term (current) drug therapy: Secondary | ICD-10-CM | POA: Diagnosis not present

## 2023-06-01 DIAGNOSIS — I5022 Chronic systolic (congestive) heart failure: Secondary | ICD-10-CM

## 2023-06-01 DIAGNOSIS — E782 Mixed hyperlipidemia: Secondary | ICD-10-CM

## 2023-06-01 MED ORDER — METOPROLOL SUCCINATE ER 50 MG PO TB24
50.0000 mg | ORAL_TABLET | Freq: Every day | ORAL | 3 refills | Status: DC
  Filled 2023-06-01: qty 30, 30d supply, fill #0

## 2023-06-01 MED ORDER — ENTRESTO 24-26 MG PO TABS
1.0000 | ORAL_TABLET | Freq: Two times a day (BID) | ORAL | 3 refills | Status: DC
  Filled 2023-06-01: qty 60, 30d supply, fill #0

## 2023-06-01 MED ORDER — ROSUVASTATIN CALCIUM 20 MG PO TABS
20.0000 mg | ORAL_TABLET | Freq: Every day | ORAL | 3 refills | Status: DC
  Filled 2023-06-01: qty 30, 30d supply, fill #0

## 2023-06-01 MED ORDER — SPIRONOLACTONE 25 MG PO TABS
12.5000 mg | ORAL_TABLET | Freq: Every day | ORAL | 3 refills | Status: DC
  Filled 2023-06-01 (×2): qty 15, 30d supply, fill #0

## 2023-06-01 MED ORDER — ROSUVASTATIN CALCIUM 20 MG PO TABS
10.0000 mg | ORAL_TABLET | Freq: Every day | ORAL | 3 refills | Status: DC
  Filled 2023-06-01: qty 15, 30d supply, fill #0

## 2023-06-01 MED ORDER — APIXABAN 5 MG PO TABS
5.0000 mg | ORAL_TABLET | Freq: Two times a day (BID) | ORAL | 3 refills | Status: DC
  Filled 2023-06-01: qty 60, 30d supply, fill #0

## 2023-06-01 NOTE — Progress Notes (Signed)
Cardiology Office Note:    Date:  06/01/2023   ID:  Walter Banks, DOB 05-22-1961, MRN [id number]  PCP:  Patient, No Pcp Per  [facility] Cardiologist:  Lorine Bears, MD  [facility] Electrophysiologist:  None   Referring MD: No ref. provider found   Chief Complaint: 1 year follow-up  History of Present Illness:    Walter Banks is a 62 y.o. male with a hx of chronic systolic heart failure due to nonischemic cardiomyopathy, persistent A-fib status post cardioversion and moderate nonobstructive one-vessel CAD.  The patient was hospitalized at [facility] in March 2017 with CHF and atrial fibrillation.  Echo showed EF of 20 to 25%, diffuse hypokinesis, moderate MR, left atrium severely dilated, mildly reduced RV SF, severely dilated right atrium.  Right and left cardiac cath showed moderate proximal LAD disease with 40% stenosis otherwise no significant stenosis.  Right heart cath showed severely elevated right heart pressures including wedge pressure.  He underwent successful cardioversion to sinus rhythm.  Ejection fraction normalized after that.  Most recent echo in 2021 showed EF of 55% with minimal pulmonary hypertension.  Patient was last seen in May 2023 and is overall doing well with no cardiac symptoms.  Today, the patient reports he has been off all his medications for 1 year due to lack of insurance. 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 He is working at [employer] now, and has insurance now. EKG shows Afib with controlled rates. He does feel shortness of breath at times. He denies chest pain, dizziness or lightheadedness. No lower leg edema, orthopnea or pnd.   Past Medical History:  Diagnosis Date   Chronic systolic CHF (congestive heart failure) (HCC)    a. 12/08/2015 Echo: EF of 20-25% w/ diffuse hypokinesis, severely dilated LA and RA, moderate MR and TR, PA peak pressure of 50 mmHg; b. 03/2016 Echo: EF 50-55%, no rwma, mildly dil LA, nl RV fxn; c. 04/2018 Echo: EF 55-60%, mild  LVH. Triv AI. Mildly dil Ao arch. Mildly dil LA/RV/RA. PASP .   H. pylori infection    History of pneumonia    Mitral regurgitation    a. 12/2015 Echo: Mod MR;  b. 03/2016 Echo: No MR (EF normalized).   NICM (nonischemic cardiomyopathy) (HCC)    a. cardiac cath 12/10/15: pLAD 40%, o/w no stenosis, severely elevated right heart pressures;  b. 03/2016 Echo: EF 50-55%; c. 04/2018 Echo: EF 55-60%.   PAF (paroxysmal atrial fibrillation) (HCC)    a. Dx 12/2015;  b. s/p DCCV 01/2016;  c. on Eliquis [CHADS2VASc = 1 (CHF)]   VT (ventricular tachycardia) (HCC)    a. 38 beat run during admission 12/2015-->associated with flushing.    Past Surgical History:  Procedure Laterality Date   CARDIAC CATHETERIZATION N/A 12/10/2015   Procedure: Right and Left Heart Cath;  Surgeon: Antonieta Iba, MD;  Location: [facility] INVASIVE CV LAB;  Service: Cardiovascular;  Laterality: N/A;   CARDIOVERSION N/A 03/26/2020   Procedure: CARDIOVERSION;  Surgeon: Yvonne Kendall, MD;  Location: [facility] ORS;  Service: Cardiovascular;  Laterality: N/A;   ELECTROPHYSIOLOGIC STUDY N/A 01/10/2016   Procedure: CARDIOVERSION;  Surgeon: Iran Ouch, MD;  Location: [facility] ORS;  Service: Cardiovascular;  Laterality: N/A;   WRIST SURGERY Left 2013    Current Medications: No outpatient medications have been marked as taking for the 06/01/23 encounter (Office Visit) with Fransico Michael, Malone Vanblarcom H, PA-C.     Allergies:   Patient has no known allergies.   Social History  Socioeconomic History   Marital status: Single    Spouse name: Not on file   Number of children: Not on file   Years of education: Not on file   Highest education level: Not on file  Occupational History   Occupation: Truck driver-Delivers bread  Tobacco Use   Smoking status: Never   Smokeless tobacco: Never  Vaping Use   Vaping status: Never Used  Substance and Sexual Activity   Alcohol use: Yes    Alcohol/week: 8.0 standard drinks of alcohol    Types: 8 Cans of beer  per week    Comment: weekly   Drug use: No   Sexual activity: Yes  Other Topics Concern   Not on file  Social History Narrative   Not on file   Social Determinants of Health   Financial Resource Strain: Not on file  Food Insecurity: Not on file  Transportation Needs: Not on file  Physical Activity: Not on file  Stress: Not on file  Social Connections: Not on file     Family History: The patient's family history includes Diabetes in his brother, father, and mother; Heart attack in his father; Heart disease in his mother; Heart failure in his sister; Hypertension in his father. There is no history of Cancer or Stroke.  ROS:   Please see the history of present illness.     All other systems reviewed and are negative.  EKGs/Labs/Other Studies Reviewed:    The following studies were reviewed today:  Echo 04/2020 1. Left ventricular ejection fraction, by estimation, is 55%. The left  ventricle has normal function. The left ventricle has no regional wall  motion abnormalities. There is mild left ventricular hypertrophy. Left  ventricular diastolic parameters are  consistent with Grade I diastolic dysfunction (impaired relaxation).   2. Right ventricular systolic function is normal. The right ventricular  size is normal. There is mildly elevated pulmonary artery systolic  pressure. The estimated right ventricular systolic pressure is 35.8 mmHg.    Echo 2019 LV EF: 55% -   60%   -------------------------------------------------------------------  History:  PMH:  Bradycardia. DOT assessment. H/O Afib,  cardiomyopathy.  Congestive heart failure.  Risk factors:  Lifelong  nonsmoker.   -------------------------------------------------------------------  Study Conclusions   - Left ventricle: The cavity size was mildly dilated. Wall    thickness was increased in a pattern of mild LVH. Systolic    function was normal. The estimated ejection fraction was in the    range of 55% to  60%. Wall motion was normal; there were no    regional wall motion abnormalities. Left ventricular diastolic    function parameters were normal.  - Aortic valve: There was trivial regurgitation.  - Ascending aorta: The ascending aorta was borderline dilated.  - Aortic arch: The aortic arch was mildly dilated.  - Left atrium: The atrium was mildly dilated.  - Right ventricle: The cavity size was mildly dilated. Wall    thickness was normal. Systolic function was normal.  - Right atrium: The atrium was mildly dilated.  - Pulmonary arteries: Systolic pressure was at the upper limits of    normal, estimated to be 30 mm Hg.   ETT 2018  Blood pressure demonstrated a normal response to exercise. There was no ST segment deviation noted during stress. No T wave inversion was noted during stress.   Normal treadmill stress test with no evidence of ischemia. Excellent exercise capacity. He was able to exercise for 10 minutes and 41 seconds with  normal heart rate and blood pressure response to exercise. Maximum workload was 12.8 METS    Echo 2017 Study Conclusions   - Left ventricle: The cavity size was mildly dilated. Systolic    function was normal. The estimated ejection fraction was in the    range of 50% to 55%. Wall motion was normal; there were no    regional wall motion abnormalities. Left ventricular diastolic    function parameters were normal.  - Ascending aorta: The ascending aorta was mildly dilated.  - Left atrium: The atrium was at the upper limits of normal in    size.  - Right ventricle: Systolic function was normal.  - Pulmonary arteries: Systolic pressure was within the normal    range.   Impressions:   - Rhythm is sinus bradycardia, rate 51 bpm. Challenging image    quality.   R/L heart cath 2017 Ost LAD to Prox LAD lesion, 40% stenosed. There is severe left ventricular systolic dysfunction.    Echo 2017 Study Conclusions   - Left ventricle: The cavity size  was severely dilated. Wall    thickness was normal. Systolic function was severely reduced. The    estimated ejection fraction was in the range of 20% to 25%.    Diffuse hypokinesis.  - Mitral valve: There was moderate regurgitation.  - Left atrium: The atrium was severely dilated.  - Right ventricle: The cavity size was mildly dilated. Wall    thickness was normal. Systolic function was mildly reduced.  - Right atrium: The atrium was severely dilated.  - Tricuspid valve: There was moderate regurgitation.  - Pulmonary arteries: Systolic pressure was moderately increased.    PA peak pressure: 50 mm Hg (S).   Transthoracic echocardiography.  M-mode, complete 2D, spectral  Doppler, and color Doppler.  Birthdate:  Patient birthdate:  07-23-1961.  Age:  Patient is 62 yr old.  Sex:  Gender: male.  BMI: 32.1 kg/m^2.  Blood pressure:     118/80  Patient status:  Inpatient.  Study date:  Study date: 12/08/2015. Study time: 01:29  PM.   EKG:  EKG is ordered today.  The ekg ordered today demonstrates Afib/flutter, 89bpm, LAD, nonspecific T wave changes  Recent Labs: 07/22/2022: ALT 55 07/24/2022: BUN 13; Creatinine, Ser 0.88; Hemoglobin 11.8; Platelets 158; Potassium 3.8; Sodium 139  Recent Lipid Panel    Component Value Date/Time   CHOL 225 (H) 05/09/2021 1001   CHOL 276 (H) 11/11/2020 1102   TRIG 154 (H) 05/09/2021 1001   HDL 42 05/09/2021 1001   HDL 34 (L) 11/11/2020 1102   CHOLHDL 5.4 05/09/2021 1001   VLDL 31 05/09/2021 1001   LDLCALC 152 (H) 05/09/2021 1001   LDLCALC 184 (H) 11/11/2020 1102   LDLDIRECT 110 (H) 04/11/2018 1450     Physical Exam:    VS:  BP 132/85 (BP Location: Left Arm, Patient Position: Sitting, Cuff Size: Normal)   Pulse 89   Ht 6\' 1"  (1.854 m)   Wt 242 lb (109.8 kg)   SpO2 98%   BMI 31.93 kg/m     Wt Readings from Last 3 Encounters:  06/01/23 242 lb (109.8 kg)  02/13/22 274 lb 8 oz (124.5 kg)  05/14/21 273 lb 8 oz (124.1 kg)     GEN:  Well  nourished, well developed in no acute distress HEENT: Normal NECK: No JVD; No carotid bruits LYMPHATICS: No lymphadenopathy CARDIAC: Irreg Irreg, no murmurs, rubs, gallops RESPIRATORY:  Clear to auscultation without rales, wheezing or rhonchi  ABDOMEN: Soft, non-tender, non-distended MUSCULOSKELETAL:  No edema; No deformity  SKIN: Warm and dry NEUROLOGIC:  Alert and oriented x 3 PSYCHIATRIC:  Normal affect   ASSESSMENT:    1. Persistent atrial fibrillation (HCC)   2. Non-ischemic cardiomyopathy (HCC)   3. Chronic systolic heart failure (HCC)   4. Coronary artery disease involving native coronary artery of native heart without angina pectoris   5. Hyperlipidemia, mixed    PLAN:    In order of problems listed above:  HFimpEF NICM History of improved EF with most recent echo in 2021 showing EF of 55%, grade 1 diastolic dysfunction.  Patient has been off all his medications for 1 year. He appears euvolemic on exam. I will restart Toprol 50 mg daily, Entresto 24-26 mg twice daily, and spironolactone 12.5 mg daily.  I recommended patient start Toprol first and Entresto 2 days later as to not drop his pressures too much.  I will do general labs with c-Met, CBC, TSH, mag. I will update an echocardiogram  Persistent Afib Patient has history of prior successful cardioversion. Today, EKG shows rate-controlled A-fib.  Patient reports minimal symptoms.  He has not been on Eliquis or Toprol for a year as above. I will restart Eliquis 5 mg twice daily and Toprol 50 mg daily.  We will see patient back in 1 month and if he is still in A-fib we will consider cardioversion. CBC, TSH and Mag as above.  Moderate 1 vessel CAD Left heart cath in 2017 showed 40% stenosis in the LAD.  Patient denies any chest pain.  No aspirin with Eliquis.  Restart Crestor 20 mg daily.  I will update lipids and A1c today.  HLD In 2022 LDL 152, HDL 42, total cholesterol 098, triglycerides 154.  Restart Crestor 20mg  daily  as above.  Update labs as above.   Disposition: Follow up in 1 month(s) with MD/APP    Signed, Dylan Ruotolo David Stall, PA-C  06/01/2023 9:25 AM    [facility]

## 2023-06-01 NOTE — Patient Instructions (Signed)
Medication Instructions:  Your physician recommends the following medication changes.   START TAKING: Rosuvastatin (Crestor) 20 mg by mouth daily Metoprolol Succinate 50 mg by mouth daily Entresto 24-26 by mouth twice a day (Start two days after starting metoprolol) Spironolactone 1/2 tablet (12.5 mg) by mouth daily Eliquis 5 mg by mouth twice a day   *If you need a refill on your cardiac medications before your next appointment, please call your pharmacy*   Lab Work: Your provider would like for you to have following labs drawn today (CBC, CMP, TSH, Lipid, Mg, A1C).     Testing/Procedures: Your physician has requested that you have an echocardiogram. Echocardiography is a painless test that uses sound waves to create images of your heart. It provides your doctor with information about the size and shape of your heart and how well your heart's chambers and valves are working.   You may receive an ultrasound enhancing agent through an IV if needed to better visualize your heart during the echo. This procedure takes approximately one hour.  There are no restrictions for this procedure.  This will take place at [address] ([facility] [address], [place] [address]    Follow-Up: At [facility], you and your health needs are our priority.  As part of our continuing mission to provide you with exceptional heart care, we have created designated Provider Care Teams.  These Care Teams include your primary Cardiologist (physician) and Advanced Practice Providers (APPs -  Physician Assistants and Nurse Practitioners) who all work together to provide you with the care you need, when you need it.  We recommend signing up for the patient portal called "MyChart".  Sign up information is provided on this After Visit Summary.  MyChart is used to connect with patients for Virtual Visits (Telemedicine).  Patients are able to view lab/test results, encounter notes, upcoming  appointments, etc.  Non-urgent messages can be sent to your provider as well.   To learn more about what you can do with MyChart, go to [email or web address].    Your next appointment:   1 month(s)  Provider:   Terrilee Croak, PA-C

## 2023-06-02 ENCOUNTER — Other Ambulatory Visit: Payer: Self-pay

## 2023-06-02 ENCOUNTER — Telehealth: Payer: Self-pay | Admitting: Cardiovascular Disease

## 2023-06-02 LAB — MAGNESIUM: Magnesium: 2.1 mg/dL (ref 1.6–2.3)

## 2023-06-02 LAB — COMPREHENSIVE METABOLIC PANEL
ALT: 24 IU/L (ref 0–44)
AST: 22 IU/L (ref 0–40)
Albumin: 4.5 g/dL (ref 3.9–4.9)
Alkaline Phosphatase: 74 IU/L (ref 44–121)
BUN/Creatinine Ratio: 24 (ref 10–24)
BUN: 23 mg/dL (ref 8–27)
Bilirubin Total: 0.4 mg/dL (ref 0.0–1.2)
CO2: 23 mmol/L (ref 20–29)
Calcium: 9.2 mg/dL (ref 8.6–10.2)
Chloride: 105 mmol/L (ref 96–106)
Creatinine, Ser: 0.95 mg/dL (ref 0.76–1.27)
Globulin, Total: 2.3 g/dL (ref 1.5–4.5)
Glucose: 101 mg/dL — ABNORMAL HIGH (ref 70–99)
Potassium: 4.9 mmol/L (ref 3.5–5.2)
Sodium: 142 mmol/L (ref 134–144)
Total Protein: 6.8 g/dL (ref 6.0–8.5)
eGFR: 90 mL/min/{1.73_m2} (ref >59)

## 2023-06-02 LAB — HEMOGLOBIN A1C
Est. average glucose Bld gHb Est-mCnc: 128 mg/dL
Hgb A1c MFr Bld: 6.1 % — ABNORMAL HIGH (ref 4.8–5.6)

## 2023-06-02 LAB — CBC
Hematocrit: 46.1 % (ref 37.5–51.0)
Hemoglobin: 15.1 g/dL (ref 13.0–17.7)
MCH: 30.1 pg (ref 26.6–33.0)
MCHC: 32.8 g/dL (ref 31.5–35.7)
MCV: 92 fL (ref 79–97)
Platelets: 189 10*3/uL (ref 150–450)
RBC: 5.01 x10E6/uL (ref 4.14–5.80)
RDW: 12.8 % (ref 11.6–15.4)
WBC: 6.5 10*3/uL (ref 3.4–10.8)

## 2023-06-02 LAB — LIPID PANEL
Chol/HDL Ratio: 4.1 ratio (ref 0.0–5.0)
Cholesterol, Total: 195 mg/dL (ref 100–199)
HDL: 47 mg/dL (ref >39)
LDL Chol Calc (NIH): 130 mg/dL — ABNORMAL HIGH (ref 0–99)
Triglycerides: 101 mg/dL (ref 0–149)
VLDL Cholesterol Cal: 18 mg/dL (ref 5–40)

## 2023-06-02 LAB — TSH: TSH: 2.63 u[IU]/mL (ref 0.450–4.500)

## 2023-06-02 MED ORDER — APIXABAN 5 MG PO TABS: 5.0000 mg | ORAL_TABLET | Freq: Two times a day (BID) | ORAL | Status: DC

## 2023-06-02 NOTE — Telephone Encounter (Signed)
Patient c/o Palpitations:  STAT if patient reporting lightheadedness, shortness of breath, or chest pain  How long have you had palpitations/irregular HR/ Afib? Are you having the symptoms now? Yes   Are you currently experiencing lightheadedness, SOB or CP? SOB   Do you have a history of afib (atrial fibrillation) or irregular heart rhythm? Yes   Have you checked your BP or HR? (document readings if available): N/A  Are you experiencing any other symptoms? No

## 2023-06-02 NOTE — Telephone Encounter (Signed)
Spoke with patient. Patient calling to say that when the pharmacy called him to inform him that his prescriptions were ready they told his prescription would be over $900. Patient states that he can not afford the medication. Patient informed of patient assistance for Eliquis.  Patient agreeable to filling out forms. Samples and patient assistance forms placed in sample room for pick up.

## 2023-06-08 ENCOUNTER — Other Ambulatory Visit (HOSPITAL_COMMUNITY): Payer: Self-pay

## 2023-06-15 ENCOUNTER — Other Ambulatory Visit: Payer: Self-pay

## 2023-06-25 MED ORDER — APIXABAN 5 MG PO TABS: 5.0000 mg | ORAL_TABLET | Freq: Two times a day (BID) | ORAL | 3 refills | Status: DC

## 2023-06-25 NOTE — Addendum Note (Signed)
Addended by: Bryna Colander on: 06/25/2023 11:40 AM   Modules accepted: Orders

## 2023-06-25 NOTE — Telephone Encounter (Signed)
Patient brought in all necessary documents to apply for assistance. Application completed and pending provider signature.

## 2023-06-29 MED ORDER — APIXABAN 5 MG PO TABS: 5.0000 mg | ORAL_TABLET | Freq: Two times a day (BID) | ORAL | 3 refills | Status: DC

## 2023-06-29 NOTE — Telephone Encounter (Signed)
Application has been completed, signed, faxed, and filed.

## 2023-06-29 NOTE — Addendum Note (Signed)
Addended by: Bryna Colander on: 06/29/2023 10:38 AM   Modules accepted: Orders

## 2023-06-30 ENCOUNTER — Ambulatory Visit: Payer: 59 | Attending: Medical

## 2023-06-30 DIAGNOSIS — I428 Other cardiomyopathies: Secondary | ICD-10-CM

## 2023-06-30 LAB — ECHOCARDIOGRAM COMPLETE: S' Lateral: 4.7 cm

## 2023-07-02 ENCOUNTER — Telehealth: Payer: Self-pay | Admitting: Cardiovascular Disease

## 2023-07-02 NOTE — Telephone Encounter (Signed)
Spoke to patient and informed him of the following results from echo as follows:  "Echo showed reduced pump function 30 to 35%.  We can repeat echo in 3 months once he has been on guideline directed medical therapy.  Encouraged medication compliance."  Patient understood and stated that he would discuss further at follow-up appointment

## 2023-07-02 NOTE — Telephone Encounter (Signed)
Patient is returning call to discuss echo results. °

## 2023-07-05 ENCOUNTER — Telehealth: Payer: Self-pay | Admitting: Cardiovascular Disease

## 2023-07-05 NOTE — Telephone Encounter (Signed)
Pt called in asking for an update on his patient assistance application

## 2023-07-06 NOTE — Telephone Encounter (Signed)
Spoke with patient and reviewed that they are requesting his tax returns regardless if he is making less this year. Advised that he can call them to discuss what is needed in order to qualify and discuss income as well. He verbalized understanding with no further questions at this time.

## 2023-07-08 ENCOUNTER — Ambulatory Visit: Payer: 59 | Admitting: Medical

## 2023-07-08 ENCOUNTER — Other Ambulatory Visit: Payer: Self-pay | Admitting: *Deleted

## 2023-07-08 ENCOUNTER — Telehealth: Payer: Self-pay | Admitting: Cardiovascular Disease

## 2023-07-08 DIAGNOSIS — I428 Other cardiomyopathies: Secondary | ICD-10-CM

## 2023-07-08 DIAGNOSIS — I4891 Unspecified atrial fibrillation: Secondary | ICD-10-CM

## 2023-07-08 MED ORDER — ENTRESTO 24-26 MG PO TABS: 1.0000 | ORAL_TABLET | Freq: Two times a day (BID) | ORAL | 3 refills | Status: DC

## 2023-07-08 MED ORDER — ROSUVASTATIN CALCIUM 20 MG PO TABS: 20.0000 mg | ORAL_TABLET | Freq: Every day | ORAL | 3 refills | Status: DC

## 2023-07-08 MED ORDER — SPIRONOLACTONE 25 MG PO TABS: 12.5000 mg | ORAL_TABLET | Freq: Every day | ORAL | 3 refills | Status: DC

## 2023-07-08 MED ORDER — APIXABAN 5 MG PO TABS: 5.0000 mg | ORAL_TABLET | Freq: Two times a day (BID) | ORAL | 1 refills | Status: DC

## 2023-07-08 MED ORDER — METOPROLOL SUCCINATE ER 50 MG PO TB24: 50.0000 mg | ORAL_TABLET | Freq: Every day | ORAL | 3 refills | Status: DC

## 2023-07-08 NOTE — Addendum Note (Signed)
Addended by: Betsy Coder B on: 07/08/2023 11:08 AM   Modules accepted: Orders

## 2023-07-08 NOTE — Telephone Encounter (Signed)
Prescription refill request for Eliquis received. Indication: Afib  Last office visit: 06/01/23 Fransico Michael)  Scr: 0.95 (06/01/23)  Age: 62 Weight: 109.8kg  Appropriate dose. Refill sent.

## 2023-07-08 NOTE — Telephone Encounter (Signed)
Refill Request.  

## 2023-07-08 NOTE — Telephone Encounter (Signed)
*  STAT* If patient is at the pharmacy, call can be transferred to refill team.   1. Which medications need to be refilled? (please list name of each medication and dose if known) Entresto, Spironolactone, Eliqius, Rosuvastatin and Metoprolol   2. Would you like to learn more about the convenience, safety, & potential cost savings by using the [facility]?      3. Are you open to using the [facility] (Type [facility].   4. Which pharmacy/location (including street and city if local pharmacy) is medication to be sent to? [facility] [address], [place]   5. Do they need a 30 day or 90 day supply? 90 days and refills

## 2023-07-27 ENCOUNTER — Encounter: Payer: Self-pay | Admitting: Medical

## 2023-07-27 ENCOUNTER — Ambulatory Visit: Payer: 59 | Attending: Medical | Admitting: Medical

## 2023-07-27 VITALS — BP 142/80 | HR 65 | Ht 74.0 in | Wt 234.2 lb

## 2023-07-27 DIAGNOSIS — I428 Other cardiomyopathies: Secondary | ICD-10-CM

## 2023-07-27 DIAGNOSIS — I2510 Atherosclerotic heart disease of native coronary artery without angina pectoris: Secondary | ICD-10-CM | POA: Diagnosis not present

## 2023-07-27 DIAGNOSIS — E782 Mixed hyperlipidemia: Secondary | ICD-10-CM

## 2023-07-27 DIAGNOSIS — I4819 Other persistent atrial fibrillation: Secondary | ICD-10-CM | POA: Diagnosis not present

## 2023-07-27 DIAGNOSIS — I5022 Chronic systolic (congestive) heart failure: Secondary | ICD-10-CM

## 2023-07-27 MED ORDER — EMPAGLIFLOZIN 10 MG PO TABS: 10.0000 mg | ORAL_TABLET | Freq: Every day | ORAL | 3 refills | Status: DC

## 2023-07-27 NOTE — Progress Notes (Signed)
Cardiology Office Note:    Date:  07/27/2023   ID:  Walter Banks, DOB [birth year], MRN [id number]  PCP:  Patient, No Pcp Per  [facility] Cardiologist:  Lorine Bears, MD  [facility] Electrophysiologist:  None   Referring MD: No ref. provider found   Chief Complaint: 2 month follow-up  History of Present Illness:    Walter Banks is a 62 y.o. male with a hx of  chronic systolic heart failure due to nonischemic cardiomyopathy, persistent A-fib status post cardioversion and moderate nonobstructive one-vessel CAD who presents for follow-up.    The patient was hospitalized at [facility] in March 2017 with CHF and atrial fibrillation.  Echo showed EF of 20 to 25%, diffuse hypokinesis, moderate MR, left atrium severely dilated, mildly reduced RV SF, severely dilated right atrium.  Right and left cardiac cath showed moderate proximal LAD disease with 40% stenosis otherwise no significant stenosis.  Right heart cath showed severely elevated right heart pressures including wedge pressure.  He underwent successful cardioversion to sinus rhythm.  Ejection fraction normalized after that.  Most recent echo in 2021 showed EF of 55% with minimal pulmonary hypertension.  Patient was last seen in August 2024 reporting he had been off all his medications for 1 year due to lack of insurance.  He recently started working at [employer] now and had insurance.  EKG showed rate controlled A-fib.  Patient was otherwise asymptomatic.  Patient was started on Toprol, Entresto, and spironolactone.  Labs were drawn and an echo was updated.  Patient was restarted on Eliquis for A-fib.  Today, the patient is overall feeling OK. He is tolerating his meds well. He denies chest pain. He has SOB when he works out. He denies swelling on his feet. We discussed diet and lifestyle today. Recommended he find a PCP. He is still in rate controlled Afib. He denies missing doses of Eliquis. He is willing to try another  cardioversion.   Past Medical History:  Diagnosis Date   Chronic systolic CHF (congestive heart failure) (HCC)    a. 12/08/2015 Echo: EF of 20-25% w/ diffuse hypokinesis, severely dilated LA and RA, moderate MR and TR, PA peak pressure of 50 mmHg; b. 03/2016 Echo: EF 50-55%, no rwma, mildly dil LA, nl RV fxn; c. 04/2018 Echo: EF 55-60%, mild LVH. Triv AI. Mildly dil Ao arch. Mildly dil LA/RV/RA. PASP .   H. pylori infection    History of pneumonia    Mitral regurgitation    a. 12/2015 Echo: Mod MR;  b. 03/2016 Echo: No MR (EF normalized).   NICM (nonischemic cardiomyopathy) (HCC)    a. cardiac cath 12/10/15: pLAD 40%, o/w no stenosis, severely elevated right heart pressures;  b. 03/2016 Echo: EF 50-55%; c. 04/2018 Echo: EF 55-60%.   PAF (paroxysmal atrial fibrillation) (HCC)    a. Dx 12/2015;  b. s/p DCCV 01/2016;  c. on Eliquis [CHADS2VASc = 1 (CHF)]   VT (ventricular tachycardia) (HCC)    a. 38 beat run during admission 12/2015-->associated with flushing.    Past Surgical History:  Procedure Laterality Date   CARDIAC CATHETERIZATION N/A 12/10/2015   Procedure: Right and Left Heart Cath;  Surgeon: Antonieta Iba, MD;  Location: [facility] INVASIVE CV LAB;  Service: Cardiovascular;  Laterality: N/A;   CARDIOVERSION N/A 03/26/2020   Procedure: CARDIOVERSION;  Surgeon: Yvonne Kendall, MD;  Location: [facility] ORS;  Service: Cardiovascular;  Laterality: N/A;   ELECTROPHYSIOLOGIC STUDY N/A 01/10/2016   Procedure: CARDIOVERSION;  Surgeon: Iran Ouch,  MD;  Location: [facility] ORS;  Service: Cardiovascular;  Laterality: N/A;   WRIST SURGERY Left 2013    Current Medications: Current Meds  Medication Sig   apixaban (ELIQUIS) 5 MG TABS tablet Take 1 tablet (5 mg total) by mouth 2 (two) times daily.   empagliflozin (JARDIANCE) 10 MG TABS tablet Take 1 tablet (10 mg total) by mouth daily before breakfast.   methocarbamol (ROBAXIN) 500 MG tablet Take 1 tablet (500 mg total) by mouth every 8 (eight) hours as  needed for muscle spasms.   metoprolol succinate (TOPROL-XL) 50 MG 24 hr tablet Take 1 tablet (50 mg total) by mouth daily. Take with or immediately following a meal.   multivitamin (ONE-A-DAY MEN'S) TABS tablet Take 1 tablet by mouth daily.   rosuvastatin (CRESTOR) 20 MG tablet Take 1 tablet (20 mg total) by mouth daily.   sacubitril-valsartan (ENTRESTO) 24-26 MG Take 1 tablet by mouth 2 (two) times daily.   spironolactone (ALDACTONE) 25 MG tablet Take 0.5 tablets (12.5 mg total) by mouth daily.     Allergies:   Patient has no known allergies.   Social History   Socioeconomic History   Marital status: Single    Spouse name: Not on file   Number of children: Not on file   Years of education: Not on file   Highest education level: Not on file  Occupational History   Occupation: Truck driver-Delivers bread  Tobacco Use   Smoking status: Never   Smokeless tobacco: Never  Vaping Use   Vaping status: Never Used  Substance and Sexual Activity   Alcohol use: Yes    Alcohol/week: 8.0 standard drinks of alcohol    Types: 8 Cans of beer per week    Comment: weekly   Drug use: No   Sexual activity: Yes  Other Topics Concern   Not on file  Social History Narrative   Not on file   Social Determinants of Health   Financial Resource Strain: Not on file  Food Insecurity: Not on file  Transportation Needs: Not on file  Physical Activity: Not on file  Stress: Not on file  Social Connections: Not on file     Family History: The patient's family history includes Diabetes in his brother, father, and mother; Heart attack in his father; Heart disease in his mother; Heart failure in his sister; Hypertension in his father. There is no history of Cancer or Stroke.  ROS:   Please see the history of present illness.     All other systems reviewed and are negative.  EKGs/Labs/Other Studies Reviewed:    The following studies were reviewed today:  Echo 06/2023  1. Left ventricular ejection  fraction, by estimation, is 30 to 35%. The  left ventricle has moderately decreased function. The left ventricle  demonstrates global hypokinesis. The left ventricular internal cavity size  was mildly dilated. Left ventricular  diastolic parameters are indeterminate. The average left ventricular  global longitudinal strain is -7.1 %.   2. Right ventricular systolic function is mildly reduced. The right  ventricular size is normal. There is normal pulmonary artery systolic  pressure. The estimated right ventricular systolic pressure is 25.2 mmHg.   3. Left atrial size was moderately dilated.   4. The mitral valve is normal in structure. Mild to moderate mitral valve  regurgitation. No evidence of mitral stenosis.   5. The aortic valve is tricuspid. Aortic valve regurgitation is mild. No  aortic stenosis is present.   6. There is mild dilatation  of the ascending aorta, measuring 40 mm.   7. The inferior vena cava is normal in size with greater than 50%  respiratory variability, suggesting right atrial pressure of 3 mmHg.   Expand All Collapse All   Cardiology Office Note:     Date:  06/01/2023    ID:  Walter Banks, DOB [birth year], MRN [id number]   PCP:  Patient, No Pcp Per      [facility] Cardiologist:  Lorine Bears, MD  [facility] Electrophysiologist:  None    Referring MD: No ref. provider found    Chief Complaint: 1 year follow-up   History of Present Illness:     Walter Banks is a 62 y.o. male with a hx of chronic systolic heart failure due to nonischemic cardiomyopathy, persistent A-fib status post cardioversion and moderate nonobstructive one-vessel CAD.   The patient was hospitalized at [facility] in March 2017 with CHF and atrial fibrillation.  Echo showed EF of 20 to 25%, diffuse hypokinesis, moderate MR, left atrium severely dilated, mildly reduced RV SF, severely dilated right atrium.  Right and left cardiac cath showed moderate proximal LAD disease with 40%  stenosis otherwise no significant stenosis.  Right heart cath showed severely elevated right heart pressures including wedge pressure.  He underwent successful cardioversion to sinus rhythm.  Ejection fraction normalized after that.  Most recent echo in 2021 showed EF of 55% with minimal pulmonary hypertension.   Patient was last seen in May 2023 and is overall doing well with no cardiac symptoms.   Today, the patient reports he has been off all his medications for 1 year due to lack of insurance. 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 He is working at [employer] now, and has insurance now. EKG shows Afib with controlled rates. He does feel shortness of breath at times. He denies chest pain, dizziness or lightheadedness. No lower leg edema, orthopnea or pnd.        Past Medical History:  Diagnosis Date   Chronic systolic CHF (congestive heart failure) (HCC)      a. 12/08/2015 Echo: EF of 20-25% w/ diffuse hypokinesis, severely dilated LA and RA, moderate MR and TR, PA peak pressure of 50 mmHg; b. 03/2016 Echo: EF 50-55%, no rwma, mildly dil LA, nl RV fxn; c. 04/2018 Echo: EF 55-60%, mild LVH. Triv AI. Mildly dil Ao arch. Mildly dil LA/RV/RA. PASP .   H. pylori infection     History of pneumonia     Mitral regurgitation      a. 12/2015 Echo: Mod MR;  b. 03/2016 Echo: No MR (EF normalized).   NICM (nonischemic cardiomyopathy) (HCC)      a. cardiac cath 12/10/15: pLAD 40%, o/w no stenosis, severely elevated right heart pressures;  b. 03/2016 Echo: EF 50-55%; c. 04/2018 Echo: EF 55-60%.   PAF (paroxysmal atrial fibrillation) (HCC)      a. Dx 12/2015;  b. s/p DCCV 01/2016;  c. on Eliquis [CHADS2VASc = 1 (CHF)]   VT (ventricular tachycardia) (HCC)      a. 38 beat run during admission 12/2015-->associated with flushing.               Past Surgical History:  Procedure Laterality Date   CARDIAC CATHETERIZATION N/A 12/10/2015    Procedure: Right and Left Heart Cath;  Surgeon: Antonieta Iba, MD;  Location: [facility]  INVASIVE CV LAB;  Service: Cardiovascular;  Laterality: N/A;   CARDIOVERSION N/A 03/26/2020    Procedure: CARDIOVERSION;  Surgeon: End,  Cristal Deer, MD;  Location: [facility] ORS;  Service: Cardiovascular;  Laterality: N/A;   ELECTROPHYSIOLOGIC STUDY N/A 01/10/2016    Procedure: CARDIOVERSION;  Surgeon: Iran Ouch, MD;  Location: [facility] ORS;  Service: Cardiovascular;  Laterality: N/A;   WRIST SURGERY Left 2013          Current Medications: Active Medications  No outpatient medications have been marked as taking for the 06/01/23 encounter (Office Visit) with Fransico Michael, Yariah Selvey H, PA-C.        Allergies:   Patient has no known allergies.    Social History         Socioeconomic History   Marital status: Single      Spouse name: Not on file   Number of children: Not on file   Years of education: Not on file   Highest education level: Not on file  Occupational History   Occupation: Truck driver-Delivers bread  Tobacco Use   Smoking status: Never   Smokeless tobacco: Never  Vaping Use   Vaping status: Never Used  Substance and Sexual Activity   Alcohol use: Yes      Alcohol/week: 8.0 standard drinks of alcohol      Types: 8 Cans of beer per week      Comment: weekly   Drug use: No   Sexual activity: Yes  Other Topics Concern   Not on file  Social History Narrative   Not on file    Social Determinants of Health    Financial Resource Strain: Not on file  Food Insecurity: Not on file  Transportation Needs: Not on file  Physical Activity: Not on file  Stress: Not on file  Social Connections: Not on file      Family History: The patient's family history includes Diabetes in his brother, father, and mother; Heart attack in his father; Heart disease in his mother; Heart failure in his sister; Hypertension in his father. There is no history of Cancer or Stroke.   ROS:   Please see the history of present illness.     All other systems reviewed and are negative.   EKGs/Labs/Other  Studies Reviewed:     The following studies were reviewed today:   Echo 04/2020 1. Left ventricular ejection fraction, by estimation, is 55%. The left  ventricle has normal function. The left ventricle has no regional wall  motion abnormalities. There is mild left ventricular hypertrophy. Left  ventricular diastolic parameters are  consistent with Grade I diastolic dysfunction (impaired relaxation).   2. Right ventricular systolic function is normal. The right ventricular  size is normal. There is mildly elevated pulmonary artery systolic  pressure. The estimated right ventricular systolic pressure is 35.8 mmHg.      Echo 2019 LV EF: 55% -   60%   -------------------------------------------------------------------  History:  PMH:  Bradycardia. DOT assessment. H/O Afib,  cardiomyopathy.  Congestive heart failure.  Risk factors:  Lifelong  nonsmoker.   -------------------------------------------------------------------  Study Conclusions   - Left ventricle: The cavity size was mildly dilated. Wall    thickness was increased in a pattern of mild LVH. Systolic    function was normal. The estimated ejection fraction was in the    range of 55% to 60%. Wall motion was normal; there were no    regional wall motion abnormalities. Left ventricular diastolic    function parameters were normal.  - Aortic valve: There was trivial regurgitation.  - Ascending aorta: The ascending aorta was borderline dilated.  - Aortic arch: The  aortic arch was mildly dilated.  - Left atrium: The atrium was mildly dilated.  - Right ventricle: The cavity size was mildly dilated. Wall    thickness was normal. Systolic function was normal.  - Right atrium: The atrium was mildly dilated.  - Pulmonary arteries: Systolic pressure was at the upper limits of    normal, estimated to be 30 mm Hg.    ETT 2018  Blood pressure demonstrated a normal response to exercise. There was no ST segment deviation noted during  stress. No T wave inversion was noted during stress.   Normal treadmill stress test with no evidence of ischemia. Excellent exercise capacity. He was able to exercise for 10 minutes and 41 seconds with normal heart rate and blood pressure response to exercise. Maximum workload was 12.8 METS     Echo 2017 Study Conclusions   - Left ventricle: The cavity size was mildly dilated. Systolic    function was normal. The estimated ejection fraction was in the    range of 50% to 55%. Wall motion was normal; there were no    regional wall motion abnormalities. Left ventricular diastolic    function parameters were normal.  - Ascending aorta: The ascending aorta was mildly dilated.  - Left atrium: The atrium was at the upper limits of normal in    size.  - Right ventricle: Systolic function was normal.  - Pulmonary arteries: Systolic pressure was within the normal    range.   Impressions:   - Rhythm is sinus bradycardia, rate 51 bpm. Challenging image    quality.    R/L heart cath 2017 Ost LAD to Prox LAD lesion, 40% stenosed. There is severe left ventricular systolic dysfunction.     Echo 2017 Study Conclusions   - Left ventricle: The cavity size was severely dilated. Wall    thickness was normal. Systolic function was severely reduced. The    estimated ejection fraction was in the range of 20% to 25%.    Diffuse hypokinesis.  - Mitral valve: There was moderate regurgitation.  - Left atrium: The atrium was severely dilated.  - Right ventricle: The cavity size was mildly dilated. Wall    thickness was normal. Systolic function was mildly reduced.  - Right atrium: The atrium was severely dilated.  - Tricuspid valve: There was moderate regurgitation.  - Pulmonary arteries: Systolic pressure was moderately increased.    PA peak pressure: 50 mm Hg (S).   Transthoracic echocardiography.  M-mode, complete 2D, spectral  Doppler, and color Doppler.  Birthdate:  Patient birthdate:   1961-03-16.  Age:  Patient is 62 yr old.  Sex:  Gender: male.  BMI: 32.1 kg/m^2.  Blood pressure:     118/80  Patient status:  Inpatient.  Study date:  Study date: 12/08/2015. Study time: 01:29  PM.      EKG:  EKG is ordered today.  The ekg ordered today demonstrates Afib 65bpm, TWI III  Recent Labs: 06/01/2023: ALT 24; BUN 23; Creatinine, Ser 0.95; Hemoglobin 15.1; Magnesium 2.1; Platelets 189; Potassium 4.9; Sodium 142; TSH 2.630  Recent Lipid Panel    Component Value Date/Time   CHOL 195 06/01/2023 0942   TRIG 101 06/01/2023 0942   HDL 47 06/01/2023 0942   CHOLHDL 4.1 06/01/2023 0942   CHOLHDL 5.4 05/09/2021 1001   VLDL 31 05/09/2021 1001   LDLCALC 130 (H) 06/01/2023 0942   LDLDIRECT 110 (H) 04/11/2018 1450    Physical Exam:    VS:  BP Marland Kitchen)  142/80 (BP Location: Left Arm, Patient Position: Sitting, Cuff Size: Normal)   Pulse 65   Ht 6\' 2"  (1.88 m)   Wt 234 lb 3.2 oz (106.2 kg)   SpO2 98%   BMI 30.07 kg/m     Wt Readings from Last 3 Encounters:  07/27/23 234 lb 3.2 oz (106.2 kg)  06/01/23 242 lb (109.8 kg)  02/13/22 274 lb 8 oz (124.5 kg)     GEN:  Well nourished, well developed in no acute distress HEENT: Normal NECK: No JVD; No carotid bruits LYMPHATICS: No lymphadenopathy CARDIAC: RRR, no murmurs, rubs, gallops RESPIRATORY:  Clear to auscultation without rales, wheezing or rhonchi  ABDOMEN: Soft, non-tender, non-distended MUSCULOSKELETAL:  No edema; No deformity  SKIN: Warm and dry NEUROLOGIC:  Alert and oriented x 3 PSYCHIATRIC:  Normal affect   ASSESSMENT:    1. Persistent atrial fibrillation (HCC)   2. Chronic systolic heart failure (HCC)   3. Non-ischemic cardiomyopathy (HCC)   4. Coronary artery disease involving native coronary artery of native heart without angina pectoris   5. Hyperlipidemia, mixed    PLAN:    In order of problems listed above:  HFimpEF Echo in 2017 showed 20-25%. LHC showed 40% stenosis ost LAD to pLAD. Repeat echo in 2017  showed LVEF 55-60%.  Patient was off his medications for 1 year due to lack of insurance. At the last visit he was restarted on Entresto, Toprol, and spironolactone.  He is overall tolerating medications well.  I will check a BMET today.  Repeat echo showed reduced EF of 30 to 35%.  Patient is euvolemic on exam today.  We will continue Entresto 24-26 mg twice daily, Toprol 50 mg daily, spironolactone 12.5 mg daily.  I will add on Jardiance 10 mg daily. Plan to re-check echo in 1-2 months.   Persistent A-fib Patient has a history of prior successful cardioversion. In August patient was noted to be back in rate controlled A-fib. He was restarted on Eliquis at that time.  Is unclear how long he has been in A-fib.  Patient is overall asymptomatic.  He is willing to retry cardioversion.  Denies missing any doses of Eliquis.  We will set him up for repeat cardioversion.  Labs today.  Moderate one-vessel CAD Left heart cath in 2017 showed 40% stenosis in the LAD.  Patient denies any anginal symptoms.  No aspirin with Eliquis.  Crestor restarted at the last visit.  Hyperlipidemia LDL 108.  Crestor restarted at the last visit as above.  We can repeat LFTs and lipid panel at follow-up.  Disposition: Follow up in 1 month(s) with MD/APP   Shared Decision Making/Informed Consent   Informed Consent   Shared Decision Making/Informed Consent The risks (stroke, cardiac arrhythmias rarely resulting in the need for a temporary or permanent pacemaker, skin irritation or burns and complications associated with conscious sedation including aspiration, arrhythmia, respiratory failure and death), benefits (restoration of normal sinus rhythm) and alternatives of a direct current cardioversion were explained in detail to Mr. Malave and he agrees to proceed.         Signed, Calene Paradiso David Stall, PA-C  07/27/2023 12:13 PM    [facility]

## 2023-07-27 NOTE — Patient Instructions (Signed)
Medication Instructions:  Your physician recommends the following medication changes.  START TAKING: Jardiance 10 mg by mouth daily (Please DO NOT start until after Cardioversion)    *If you need a refill on your cardiac medications before your next appointment, please call your pharmacy*   Lab Work: Your provider would like for you to have following labs drawn today (CBC, BMP).     Testing/Procedures: Cardioversion Please see instructions listed below   Follow-Up: At [facility], you and your health needs are our priority.  As part of our continuing mission to provide you with exceptional heart care, we have created designated Provider Care Teams.  These Care Teams include your primary Cardiologist (physician) and Advanced Practice Providers (APPs -  Physician Assistants and Nurse Practitioners) who all work together to provide you with the care you need, when you need it.  We recommend signing up for the patient portal called "MyChart".  Sign up information is provided on this After Visit Summary.  MyChart is used to connect with patients for Virtual Visits (Telemedicine).  Patients are able to view lab/test results, encounter notes, upcoming appointments, etc.  Non-urgent messages can be sent to your provider as well.   To learn more about what you can do with MyChart, go to [email or web address].    Your next appointment:   1 week(s)  Provider:   Terrilee Croak, PA-C        Dear Walter Banks  You are scheduled for a Cardioversion on Friday, October 25 with Dr. Azucena Cecil.  Please arrive at the [facility] Entrance of [facility], [address], [place] [address] at 11:30 AM (This is 1 hour(s) prior to your procedure time).  Proceed to the Check-In Desk directly inside the entrance.  Procedure Parking: Use the entrance off of the [address] side of the hospital. Turn right upon entering and follow the driveway to parking that is directly in  front of the [facility]. There is no valet parking available at this entrance, however there is an awning directly in front of the [facility] for drop off/ pick up for patients.    DIET:  Nothing to eat or drink after midnight except a sip of water with medications (see medication instructions below)  DO NOT start Jardiance until after procedure        :1}Continue taking your anticoagulant (blood thinner): Apixaban (Eliquis).  You will need to continue this after your procedure until you are told by your provider that it is safe to stop.    LABS: Today (CBC, BMP)  FYI:  For your safety, and to allow [place] to monitor your vital signs accurately during the surgery/procedure we request: If you have artificial nails, gel coating, SNS etc, please have those removed prior to your surgery/procedure. Not having the nail coverings /polish removed may result in cancellation or delay of your surgery/procedure.  You must have a responsible person to drive you home and stay in the waiting area during your procedure. Failure to do so could result in cancellation.  Bring your insurance cards.  *Special Note: Every effort is made to have your procedure done on time. Occasionally there are emergencies that occur at the hospital that may cause delays. Please be patient if a delay does occur.

## 2023-07-27 NOTE — H&P (View-Only) (Signed)
Cardiology Office Note:    Date:  07/27/2023   ID:  Walter Banks, DOB [birth year], MRN [id number]  PCP:  Patient, No Pcp Per  [facility] Cardiologist:  Lorine Bears, MD  [facility] Electrophysiologist:  None   Referring MD: No ref. provider found   Chief Complaint: 2 month follow-up  History of Present Illness:    Walter Banks is a 62 y.o. male with a hx of  chronic systolic heart failure due to nonischemic cardiomyopathy, persistent A-fib status post cardioversion and moderate nonobstructive one-vessel CAD who presents for follow-up.    The patient was hospitalized at [facility] in March 2017 with CHF and atrial fibrillation.  Echo showed EF of 20 to 25%, diffuse hypokinesis, moderate MR, left atrium severely dilated, mildly reduced RV SF, severely dilated right atrium.  Right and left cardiac cath showed moderate proximal LAD disease with 40% stenosis otherwise no significant stenosis.  Right heart cath showed severely elevated right heart pressures including wedge pressure.  He underwent successful cardioversion to sinus rhythm.  Ejection fraction normalized after that.  Most recent echo in 2021 showed EF of 55% with minimal pulmonary hypertension.  Patient was last seen in August 2024 reporting he had been off all his medications for 1 year due to lack of insurance.  He recently started working at [employer] now and had insurance.  EKG showed rate controlled A-fib.  Patient was otherwise asymptomatic.  Patient was started on Toprol, Entresto, and spironolactone.  Labs were drawn and an echo was updated.  Patient was restarted on Eliquis for A-fib.  Today, the patient is overall feeling OK. He is tolerating his meds well. He denies chest pain. He has SOB when he works out. He denies swelling on his feet. We discussed diet and lifestyle today. Recommended he find a PCP. He is still in rate controlled Afib. He denies missing doses of Eliquis. He is willing to try another  cardioversion.   Past Medical History:  Diagnosis Date   Chronic systolic CHF (congestive heart failure) (HCC)    a. 12/08/2015 Echo: EF of 20-25% w/ diffuse hypokinesis, severely dilated LA and RA, moderate MR and TR, PA peak pressure of 50 mmHg; b. 03/2016 Echo: EF 50-55%, no rwma, mildly dil LA, nl RV fxn; c. 04/2018 Echo: EF 55-60%, mild LVH. Triv AI. Mildly dil Ao arch. Mildly dil LA/RV/RA. PASP .   H. pylori infection    History of pneumonia    Mitral regurgitation    a. 12/2015 Echo: Mod MR;  b. 03/2016 Echo: No MR (EF normalized).   NICM (nonischemic cardiomyopathy) (HCC)    a. cardiac cath 12/10/15: pLAD 40%, o/w no stenosis, severely elevated right heart pressures;  b. 03/2016 Echo: EF 50-55%; c. 04/2018 Echo: EF 55-60%.   PAF (paroxysmal atrial fibrillation) (HCC)    a. Dx 12/2015;  b. s/p DCCV 01/2016;  c. on Eliquis [CHADS2VASc = 1 (CHF)]   VT (ventricular tachycardia) (HCC)    a. 38 beat run during admission 12/2015-->associated with flushing.    Past Surgical History:  Procedure Laterality Date   CARDIAC CATHETERIZATION N/A 12/10/2015   Procedure: Right and Left Heart Cath;  Surgeon: Antonieta Iba, MD;  Location: [facility] INVASIVE CV LAB;  Service: Cardiovascular;  Laterality: N/A;   CARDIOVERSION N/A 03/26/2020   Procedure: CARDIOVERSION;  Surgeon: Yvonne Kendall, MD;  Location: [facility] ORS;  Service: Cardiovascular;  Laterality: N/A;   ELECTROPHYSIOLOGIC STUDY N/A 01/10/2016   Procedure: CARDIOVERSION;  Surgeon: Iran Ouch,  MD;  Location: [facility] ORS;  Service: Cardiovascular;  Laterality: N/A;   WRIST SURGERY Left 2013    Current Medications: Current Meds  Medication Sig   apixaban (ELIQUIS) 5 MG TABS tablet Take 1 tablet (5 mg total) by mouth 2 (two) times daily.   empagliflozin (JARDIANCE) 10 MG TABS tablet Take 1 tablet (10 mg total) by mouth daily before breakfast.   methocarbamol (ROBAXIN) 500 MG tablet Take 1 tablet (500 mg total) by mouth every 8 (eight) hours as  needed for muscle spasms.   metoprolol succinate (TOPROL-XL) 50 MG 24 hr tablet Take 1 tablet (50 mg total) by mouth daily. Take with or immediately following a meal.   multivitamin (ONE-A-DAY MEN'S) TABS tablet Take 1 tablet by mouth daily.   rosuvastatin (CRESTOR) 20 MG tablet Take 1 tablet (20 mg total) by mouth daily.   sacubitril-valsartan (ENTRESTO) 24-26 MG Take 1 tablet by mouth 2 (two) times daily.   spironolactone (ALDACTONE) 25 MG tablet Take 0.5 tablets (12.5 mg total) by mouth daily.     Allergies:   Patient has no known allergies.   Social History   Socioeconomic History   Marital status: Single    Spouse name: Not on file   Number of children: Not on file   Years of education: Not on file   Highest education level: Not on file  Occupational History   Occupation: Truck driver-Delivers bread  Tobacco Use   Smoking status: Never   Smokeless tobacco: Never  Vaping Use   Vaping status: Never Used  Substance and Sexual Activity   Alcohol use: Yes    Alcohol/week: 8.0 standard drinks of alcohol    Types: 8 Cans of beer per week    Comment: weekly   Drug use: No   Sexual activity: Yes  Other Topics Concern   Not on file  Social History Narrative   Not on file   Social Determinants of Health   Financial Resource Strain: Not on file  Food Insecurity: Not on file  Transportation Needs: Not on file  Physical Activity: Not on file  Stress: Not on file  Social Connections: Not on file     Family History: The patient's family history includes Diabetes in his brother, father, and mother; Heart attack in his father; Heart disease in his mother; Heart failure in his sister; Hypertension in his father. There is no history of Cancer or Stroke.  ROS:   Please see the history of present illness.     All other systems reviewed and are negative.  EKGs/Labs/Other Studies Reviewed:    The following studies were reviewed today:  Echo 06/2023  1. Left ventricular ejection  fraction, by estimation, is 30 to 35%. The  left ventricle has moderately decreased function. The left ventricle  demonstrates global hypokinesis. The left ventricular internal cavity size  was mildly dilated. Left ventricular  diastolic parameters are indeterminate. The average left ventricular  global longitudinal strain is -7.1 %.   2. Right ventricular systolic function is mildly reduced. The right  ventricular size is normal. There is normal pulmonary artery systolic  pressure. The estimated right ventricular systolic pressure is 25.2 mmHg.   3. Left atrial size was moderately dilated.   4. The mitral valve is normal in structure. Mild to moderate mitral valve  regurgitation. No evidence of mitral stenosis.   5. The aortic valve is tricuspid. Aortic valve regurgitation is mild. No  aortic stenosis is present.   6. There is mild dilatation  of the ascending aorta, measuring 40 mm.   7. The inferior vena cava is normal in size with greater than 50%  respiratory variability, suggesting right atrial pressure of 3 mmHg.   Expand All Collapse All   Cardiology Office Note:     Date:  06/01/2023    ID:  MONTEL BORAK, DOB [birth year], MRN [id number]   PCP:  Patient, No Pcp Per      [facility] Cardiologist:  Lorine Bears, MD  [facility] Electrophysiologist:  None    Referring MD: No ref. provider found    Chief Complaint: 1 year follow-up   History of Present Illness:     Walter Banks is a 62 y.o. male with a hx of chronic systolic heart failure due to nonischemic cardiomyopathy, persistent A-fib status post cardioversion and moderate nonobstructive one-vessel CAD.   The patient was hospitalized at [facility] in March 2017 with CHF and atrial fibrillation.  Echo showed EF of 20 to 25%, diffuse hypokinesis, moderate MR, left atrium severely dilated, mildly reduced RV SF, severely dilated right atrium.  Right and left cardiac cath showed moderate proximal LAD disease with 40%  stenosis otherwise no significant stenosis.  Right heart cath showed severely elevated right heart pressures including wedge pressure.  He underwent successful cardioversion to sinus rhythm.  Ejection fraction normalized after that.  Most recent echo in 2021 showed EF of 55% with minimal pulmonary hypertension.   Patient was last seen in May 2023 and is overall doing well with no cardiac symptoms.   Today, the patient reports he has been off all his medications for 1 year due to lack of insurance. 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 He is working at [employer] now, and has insurance now. EKG shows Afib with controlled rates. He does feel shortness of breath at times. He denies chest pain, dizziness or lightheadedness. No lower leg edema, orthopnea or pnd.        Past Medical History:  Diagnosis Date   Chronic systolic CHF (congestive heart failure) (HCC)      a. 12/08/2015 Echo: EF of 20-25% w/ diffuse hypokinesis, severely dilated LA and RA, moderate MR and TR, PA peak pressure of 50 mmHg; b. 03/2016 Echo: EF 50-55%, no rwma, mildly dil LA, nl RV fxn; c. 04/2018 Echo: EF 55-60%, mild LVH. Triv AI. Mildly dil Ao arch. Mildly dil LA/RV/RA. PASP .   H. pylori infection     History of pneumonia     Mitral regurgitation      a. 12/2015 Echo: Mod MR;  b. 03/2016 Echo: No MR (EF normalized).   NICM (nonischemic cardiomyopathy) (HCC)      a. cardiac cath 12/10/15: pLAD 40%, o/w no stenosis, severely elevated right heart pressures;  b. 03/2016 Echo: EF 50-55%; c. 04/2018 Echo: EF 55-60%.   PAF (paroxysmal atrial fibrillation) (HCC)      a. Dx 12/2015;  b. s/p DCCV 01/2016;  c. on Eliquis [CHADS2VASc = 1 (CHF)]   VT (ventricular tachycardia) (HCC)      a. 38 beat run during admission 12/2015-->associated with flushing.               Past Surgical History:  Procedure Laterality Date   CARDIAC CATHETERIZATION N/A 12/10/2015    Procedure: Right and Left Heart Cath;  Surgeon: Antonieta Iba, MD;  Location: [facility]  INVASIVE CV LAB;  Service: Cardiovascular;  Laterality: N/A;   CARDIOVERSION N/A 03/26/2020    Procedure: CARDIOVERSION;  Surgeon: End,  Cristal Deer, MD;  Location: [facility] ORS;  Service: Cardiovascular;  Laterality: N/A;   ELECTROPHYSIOLOGIC STUDY N/A 01/10/2016    Procedure: CARDIOVERSION;  Surgeon: Iran Ouch, MD;  Location: [facility] ORS;  Service: Cardiovascular;  Laterality: N/A;   WRIST SURGERY Left 2013          Current Medications: Active Medications  No outpatient medications have been marked as taking for the 06/01/23 encounter (Office Visit) with Fransico Michael, Yariah Selvey H, PA-C.        Allergies:   Patient has no known allergies.    Social History         Socioeconomic History   Marital status: Single      Spouse name: Not on file   Number of children: Not on file   Years of education: Not on file   Highest education level: Not on file  Occupational History   Occupation: Truck driver-Delivers bread  Tobacco Use   Smoking status: Never   Smokeless tobacco: Never  Vaping Use   Vaping status: Never Used  Substance and Sexual Activity   Alcohol use: Yes      Alcohol/week: 8.0 standard drinks of alcohol      Types: 8 Cans of beer per week      Comment: weekly   Drug use: No   Sexual activity: Yes  Other Topics Concern   Not on file  Social History Narrative   Not on file    Social Determinants of Health    Financial Resource Strain: Not on file  Food Insecurity: Not on file  Transportation Needs: Not on file  Physical Activity: Not on file  Stress: Not on file  Social Connections: Not on file      Family History: The patient's family history includes Diabetes in his brother, father, and mother; Heart attack in his father; Heart disease in his mother; Heart failure in his sister; Hypertension in his father. There is no history of Cancer or Stroke.   ROS:   Please see the history of present illness.     All other systems reviewed and are negative.   EKGs/Labs/Other  Studies Reviewed:     The following studies were reviewed today:   Echo 04/2020 1. Left ventricular ejection fraction, by estimation, is 55%. The left  ventricle has normal function. The left ventricle has no regional wall  motion abnormalities. There is mild left ventricular hypertrophy. Left  ventricular diastolic parameters are  consistent with Grade I diastolic dysfunction (impaired relaxation).   2. Right ventricular systolic function is normal. The right ventricular  size is normal. There is mildly elevated pulmonary artery systolic  pressure. The estimated right ventricular systolic pressure is 35.8 mmHg.      Echo 2019 LV EF: 55% -   60%   -------------------------------------------------------------------  History:  PMH:  Bradycardia. DOT assessment. H/O Afib,  cardiomyopathy.  Congestive heart failure.  Risk factors:  Lifelong  nonsmoker.   -------------------------------------------------------------------  Study Conclusions   - Left ventricle: The cavity size was mildly dilated. Wall    thickness was increased in a pattern of mild LVH. Systolic    function was normal. The estimated ejection fraction was in the    range of 55% to 60%. Wall motion was normal; there were no    regional wall motion abnormalities. Left ventricular diastolic    function parameters were normal.  - Aortic valve: There was trivial regurgitation.  - Ascending aorta: The ascending aorta was borderline dilated.  - Aortic arch: The  aortic arch was mildly dilated.  - Left atrium: The atrium was mildly dilated.  - Right ventricle: The cavity size was mildly dilated. Wall    thickness was normal. Systolic function was normal.  - Right atrium: The atrium was mildly dilated.  - Pulmonary arteries: Systolic pressure was at the upper limits of    normal, estimated to be 30 mm Hg.    ETT 2018  Blood pressure demonstrated a normal response to exercise. There was no ST segment deviation noted during  stress. No T wave inversion was noted during stress.   Normal treadmill stress test with no evidence of ischemia. Excellent exercise capacity. He was able to exercise for 10 minutes and 41 seconds with normal heart rate and blood pressure response to exercise. Maximum workload was 12.8 METS     Echo 2017 Study Conclusions   - Left ventricle: The cavity size was mildly dilated. Systolic    function was normal. The estimated ejection fraction was in the    range of 50% to 55%. Wall motion was normal; there were no    regional wall motion abnormalities. Left ventricular diastolic    function parameters were normal.  - Ascending aorta: The ascending aorta was mildly dilated.  - Left atrium: The atrium was at the upper limits of normal in    size.  - Right ventricle: Systolic function was normal.  - Pulmonary arteries: Systolic pressure was within the normal    range.   Impressions:   - Rhythm is sinus bradycardia, rate 51 bpm. Challenging image    quality.    R/L heart cath 2017 Ost LAD to Prox LAD lesion, 40% stenosed. There is severe left ventricular systolic dysfunction.     Echo 2017 Study Conclusions   - Left ventricle: The cavity size was severely dilated. Wall    thickness was normal. Systolic function was severely reduced. The    estimated ejection fraction was in the range of 20% to 25%.    Diffuse hypokinesis.  - Mitral valve: There was moderate regurgitation.  - Left atrium: The atrium was severely dilated.  - Right ventricle: The cavity size was mildly dilated. Wall    thickness was normal. Systolic function was mildly reduced.  - Right atrium: The atrium was severely dilated.  - Tricuspid valve: There was moderate regurgitation.  - Pulmonary arteries: Systolic pressure was moderately increased.    PA peak pressure: 50 mm Hg (S).   Transthoracic echocardiography.  M-mode, complete 2D, spectral  Doppler, and color Doppler.  Birthdate:  Patient birthdate:   1961-03-16.  Age:  Patient is 62 yr old.  Sex:  Gender: male.  BMI: 32.1 kg/m^2.  Blood pressure:     118/80  Patient status:  Inpatient.  Study date:  Study date: 12/08/2015. Study time: 01:29  PM.      EKG:  EKG is ordered today.  The ekg ordered today demonstrates Afib 65bpm, TWI III  Recent Labs: 06/01/2023: ALT 24; BUN 23; Creatinine, Ser 0.95; Hemoglobin 15.1; Magnesium 2.1; Platelets 189; Potassium 4.9; Sodium 142; TSH 2.630  Recent Lipid Panel    Component Value Date/Time   CHOL 195 06/01/2023 0942   TRIG 101 06/01/2023 0942   HDL 47 06/01/2023 0942   CHOLHDL 4.1 06/01/2023 0942   CHOLHDL 5.4 05/09/2021 1001   VLDL 31 05/09/2021 1001   LDLCALC 130 (H) 06/01/2023 0942   LDLDIRECT 110 (H) 04/11/2018 1450    Physical Exam:    VS:  BP Marland Kitchen)  142/80 (BP Location: Left Arm, Patient Position: Sitting, Cuff Size: Normal)   Pulse 65   Ht 6\' 2"  (1.88 m)   Wt 234 lb 3.2 oz (106.2 kg)   SpO2 98%   BMI 30.07 kg/m     Wt Readings from Last 3 Encounters:  07/27/23 234 lb 3.2 oz (106.2 kg)  06/01/23 242 lb (109.8 kg)  02/13/22 274 lb 8 oz (124.5 kg)     GEN:  Well nourished, well developed in no acute distress HEENT: Normal NECK: No JVD; No carotid bruits LYMPHATICS: No lymphadenopathy CARDIAC: RRR, no murmurs, rubs, gallops RESPIRATORY:  Clear to auscultation without rales, wheezing or rhonchi  ABDOMEN: Soft, non-tender, non-distended MUSCULOSKELETAL:  No edema; No deformity  SKIN: Warm and dry NEUROLOGIC:  Alert and oriented x 3 PSYCHIATRIC:  Normal affect   ASSESSMENT:    1. Persistent atrial fibrillation (HCC)   2. Chronic systolic heart failure (HCC)   3. Non-ischemic cardiomyopathy (HCC)   4. Coronary artery disease involving native coronary artery of native heart without angina pectoris   5. Hyperlipidemia, mixed    PLAN:    In order of problems listed above:  HFimpEF Echo in 2017 showed 20-25%. LHC showed 40% stenosis ost LAD to pLAD. Repeat echo in 2017  showed LVEF 55-60%.  Patient was off his medications for 1 year due to lack of insurance. At the last visit he was restarted on Entresto, Toprol, and spironolactone.  He is overall tolerating medications well.  I will check a BMET today.  Repeat echo showed reduced EF of 30 to 35%.  Patient is euvolemic on exam today.  We will continue Entresto 24-26 mg twice daily, Toprol 50 mg daily, spironolactone 12.5 mg daily.  I will add on Jardiance 10 mg daily. Plan to re-check echo in 1-2 months.   Persistent A-fib Patient has a history of prior successful cardioversion. In August patient was noted to be back in rate controlled A-fib. He was restarted on Eliquis at that time.  Is unclear how long he has been in A-fib.  Patient is overall asymptomatic.  He is willing to retry cardioversion.  Denies missing any doses of Eliquis.  We will set him up for repeat cardioversion.  Labs today.  Moderate one-vessel CAD Left heart cath in 2017 showed 40% stenosis in the LAD.  Patient denies any anginal symptoms.  No aspirin with Eliquis.  Crestor restarted at the last visit.  Hyperlipidemia LDL 108.  Crestor restarted at the last visit as above.  We can repeat LFTs and lipid panel at follow-up.  Disposition: Follow up in 1 month(s) with MD/APP   Shared Decision Making/Informed Consent   Informed Consent   Shared Decision Making/Informed Consent The risks (stroke, cardiac arrhythmias rarely resulting in the need for a temporary or permanent pacemaker, skin irritation or burns and complications associated with conscious sedation including aspiration, arrhythmia, respiratory failure and death), benefits (restoration of normal sinus rhythm) and alternatives of a direct current cardioversion were explained in detail to Mr. Malave and he agrees to proceed.         Signed, Calene Paradiso David Stall, PA-C  07/27/2023 12:13 PM    [facility]

## 2023-07-28 LAB — CBC
Hematocrit: 47.3 % (ref 37.5–51.0)
Hemoglobin: 15.6 g/dL (ref 13.0–17.7)
MCH: 31.1 pg (ref 26.6–33.0)
MCHC: 33 g/dL (ref 31.5–35.7)
MCV: 94 fL (ref 79–97)
Platelets: 190 10*3/uL (ref 150–450)
RBC: 5.01 x10E6/uL (ref 4.14–5.80)
RDW: 13.3 % (ref 11.6–15.4)
WBC: 6.8 10*3/uL (ref 3.4–10.8)

## 2023-07-28 LAB — BASIC METABOLIC PANEL
BUN/Creatinine Ratio: 28 — ABNORMAL HIGH (ref 10–24)
BUN: 23 mg/dL (ref 8–27)
CO2: 18 mmol/L — ABNORMAL LOW (ref 20–29)
Calcium: 9.4 mg/dL (ref 8.6–10.2)
Chloride: 103 mmol/L (ref 96–106)
Creatinine, Ser: 0.82 mg/dL (ref 0.76–1.27)
Glucose: 104 mg/dL — ABNORMAL HIGH (ref 70–99)
Potassium: 4.4 mmol/L (ref 3.5–5.2)
Sodium: 139 mmol/L (ref 134–144)
eGFR: 99 mL/min/{1.73_m2} (ref >59)

## 2023-07-30 ENCOUNTER — Other Ambulatory Visit: Payer: Self-pay

## 2023-07-30 ENCOUNTER — Encounter
Admission: RE | Disposition: A | Discharge status: Home / Self Care | Payer: Self-pay | Source: Home / Self Care | Attending: Cardiology

## 2023-07-30 ENCOUNTER — Ambulatory Visit
Admission: RE | Admit: 2023-07-30 | Discharge: 2023-07-30 | Disposition: A | Discharge status: Home / Self Care | Payer: BC Managed Care – PPO | Attending: Cardiology | Admitting: Cardiology

## 2023-07-30 ENCOUNTER — Telehealth: Payer: Self-pay | Admitting: Pharmacy Technician

## 2023-07-30 ENCOUNTER — Encounter: Payer: Self-pay | Admitting: Cardiology

## 2023-07-30 ENCOUNTER — Ambulatory Visit: Payer: BC Managed Care – PPO | Admitting: Certified Registered"

## 2023-07-30 ENCOUNTER — Other Ambulatory Visit (HOSPITAL_COMMUNITY): Payer: Self-pay

## 2023-07-30 DIAGNOSIS — Z7901 Long term (current) use of anticoagulants: Secondary | ICD-10-CM | POA: Diagnosis not present

## 2023-07-30 DIAGNOSIS — Z833 Family history of diabetes mellitus: Secondary | ICD-10-CM | POA: Insufficient documentation

## 2023-07-30 DIAGNOSIS — E782 Mixed hyperlipidemia: Secondary | ICD-10-CM | POA: Diagnosis not present

## 2023-07-30 DIAGNOSIS — I428 Other cardiomyopathies: Secondary | ICD-10-CM | POA: Diagnosis not present

## 2023-07-30 DIAGNOSIS — I480 Paroxysmal atrial fibrillation: Secondary | ICD-10-CM | POA: Insufficient documentation

## 2023-07-30 DIAGNOSIS — I110 Hypertensive heart disease with heart failure: Secondary | ICD-10-CM | POA: Diagnosis not present

## 2023-07-30 DIAGNOSIS — I491 Atrial premature depolarization: Secondary | ICD-10-CM | POA: Diagnosis not present

## 2023-07-30 DIAGNOSIS — I081 Rheumatic disorders of both mitral and tricuspid valves: Secondary | ICD-10-CM | POA: Diagnosis not present

## 2023-07-30 DIAGNOSIS — Z8249 Family history of ischemic heart disease and other diseases of the circulatory system: Secondary | ICD-10-CM | POA: Insufficient documentation

## 2023-07-30 DIAGNOSIS — I5022 Chronic systolic (congestive) heart failure: Secondary | ICD-10-CM | POA: Diagnosis not present

## 2023-07-30 DIAGNOSIS — I272 Pulmonary hypertension, unspecified: Secondary | ICD-10-CM | POA: Diagnosis not present

## 2023-07-30 DIAGNOSIS — I2510 Atherosclerotic heart disease of native coronary artery without angina pectoris: Secondary | ICD-10-CM | POA: Insufficient documentation

## 2023-07-30 DIAGNOSIS — I4819 Other persistent atrial fibrillation: Secondary | ICD-10-CM | POA: Insufficient documentation

## 2023-07-30 DIAGNOSIS — Z79899 Other long term (current) drug therapy: Secondary | ICD-10-CM | POA: Diagnosis not present

## 2023-07-30 HISTORY — PX: CARDIOVERSION: SHX1299

## 2023-07-30 SURGERY — CARDIOVERSION
Anesthesia: General

## 2023-07-30 MED ORDER — PROPOFOL 10 MG/ML IV BOLUS
INTRAVENOUS | Status: DC | PRN
  Administered 2023-07-30: 90 mg via INTRAVENOUS

## 2023-07-30 NOTE — Anesthesia Postprocedure Evaluation (Signed)
Anesthesia Post Note  Patient: Walter Banks  Procedure(s) Performed: CARDIOVERSION  Patient location during evaluation: Phase II Anesthesia Type: General Level of consciousness: awake and alert Pain management: pain level controlled Vital Signs Assessment: post-procedure vital signs reviewed and stable Respiratory status: spontaneous breathing Cardiovascular status: stable Anesthetic complications: no   No notable events documented.   Last Vitals:  Vitals:   07/30/23 1251 07/30/23 1300  BP: (!) 123/95 96/69  Pulse: (!) 48 (!) 50  Resp: 20 18  Temp:    SpO2: 100% 98%    Last Pain:  Vitals:   07/30/23 1300  TempSrc:   PainSc: 0-No pain                 VAN STAVEREN,Johann Santone

## 2023-07-30 NOTE — Transfer of Care (Signed)
Immediate Anesthesia Transfer of Care Note  Patient: Walter Banks  Procedure(s) Performed: Procedure(s): CARDIOVERSION (N/A)  Patient Location: PACU and Short Stay  Anesthesia Type:General  Level of Consciousness: awake, alert  and oriented  Airway & Oxygen Therapy: Patient Spontanous Breathing and Patient connected to nasal cannula oxygen  Post-op Assessment: Report given to RN and Post -op Vital signs reviewed and stable  Post vital signs: Reviewed and stable  Last Vitals:  Vitals:   07/30/23 1245 07/30/23 1246  BP: 104/86 111/82  Pulse: 61   Resp: 14 14  Temp:    SpO2: 92% 99%    Complications: No apparent anesthesia complications

## 2023-07-30 NOTE — Interval H&P Note (Signed)
History and Physical Interval Note:  07/30/2023 12:43 PM  Walter Banks  has presented today for surgery, with the diagnosis of persistent Afib.  The various methods of treatment have been discussed with the patient and family. After consideration of risks, benefits and other options for treatment, the patient has consented to  Procedure(s): CARDIOVERSION (N/A) as a surgical intervention.  The patient's history has been reviewed, patient examined, no change in status, stable for surgery.  I have reviewed the patient's chart and labs.  Questions were answered to the patient's satisfaction.     Arlys John Agbor-Etang

## 2023-07-30 NOTE — Telephone Encounter (Signed)
Pharmacy Patient Advocate Encounter   Received notification from CoverMyMeds that prior authorization for jardiance is required/requested.   Insurance verification completed.   The patient is insured through [employer] [facility] .   Per test claim: PA required; PA submitted to [employer] [facility] via CoverMyMeds Key/confirmation #/EOC [id number] Status is pending

## 2023-07-30 NOTE — Procedures (Signed)
Cardioversion procedure note For atrial fibrillation.  Procedure Details:  Consent: Risks of procedure as well as the alternatives and risks of each were explained to the (patient/caregiver).  Consent for procedure obtained.  Time Out: Verified patient identification, verified procedure, site/side was marked, verified correct patient position, special equipment/implants available, medications/allergies/relevent history reviewed, required imaging and test results available.  Performed  Patient placed on cardiac monitor, pulse oximetry, supplemental oxygen as necessary.   Sedation given: propofol IV, per anesthesia team Pacer pads placed anterior and posterior chest.   Cardioverted 1 time(s).   Cardioverted at  200J. Synchronized biphasic Converted to NSR   Evaluation: Findings: Post procedure EKG shows: NSR Complications: None Patient did tolerate procedure well.  Time Spent Directly with the Patient:  25 minutes   Demetrious Rainford Agbor-Etang, M.D.  

## 2023-07-30 NOTE — Anesthesia Preprocedure Evaluation (Signed)
Anesthesia Evaluation  Patient identified by MRN, date of birth, ID band Patient awake    Reviewed: Allergy & Precautions, NPO status , Patient's Chart, lab work & pertinent test results  Airway Mallampati: II  TM Distance: >3 FB Neck ROM: full    Dental  (+) Teeth Intact, Missing   Pulmonary neg pulmonary ROS   Pulmonary exam normal breath sounds clear to auscultation       Cardiovascular Exercise Tolerance: Good hypertension, Pt. on medications +CHF  negative cardio ROS Normal cardiovascular exam+ dysrhythmias Atrial Fibrillation  Rhythm:Irregular Rate:Abnormal     Neuro/Psych negative neurological ROS  negative psych ROS   GI/Hepatic negative GI ROS, Neg liver ROS,,,  Endo/Other  negative endocrine ROS    Renal/GU negative Renal ROS  negative genitourinary   Musculoskeletal   Abdominal Normal abdominal exam  (+)   Peds  Hematology negative hematology ROS (+)   Anesthesia Other Findings Past Medical History: No date: Chronic systolic CHF (congestive heart failure) (HCC)     Comment:  a. 12/08/2015 Echo: EF of 20-25% w/ diffuse hypokinesis,               severely dilated LA and RA, moderate MR and TR, PA peak               pressure of 50 mmHg; b. 03/2016 Echo: EF 50-55%, no rwma,               mildly dil LA, nl RV fxn; c. 04/2018 Echo: EF 55-60%, mild              LVH. Triv AI. Mildly dil Ao arch. Mildly dil LA/RV/RA.               PASP . No date: H. pylori infection No date: Mitral regurgitation     Comment:  a. 12/2015 Echo: Mod MR;  b. 03/2016 Echo: No MR (EF               normalized). No date: NICM (nonischemic cardiomyopathy) (HCC)     Comment:  a. cardiac cath 12/10/15: pLAD 40%, o/w no stenosis,               severely elevated right heart pressures;  b. 03/2016 Echo:              EF 50-55%; c. 04/2018 Echo: EF 55-60%. No date: PAF (paroxysmal atrial fibrillation) (HCC)     Comment:  a. Dx 12/2015;  b.  s/p DCCV 01/2016;  c. on Eliquis               [CHADS2VASc = 1 (CHF)] No date: VT (ventricular tachycardia) (HCC)     Comment:  a. 38 beat run during admission 12/2015-->associated with              flushing.  Past Surgical History: 12/10/2015: CARDIAC CATHETERIZATION; N/A     Comment:  Procedure: Right and Left Heart Cath;  Surgeon: Antonieta Iba, MD;  Location: [facility] INVASIVE CV LAB;  Service:               Cardiovascular;  Laterality: N/A; 03/26/2020: CARDIOVERSION; N/A     Comment:  Procedure: CARDIOVERSION;  Surgeon: Yvonne Kendall,               MD;  Location: [facility] ORS;  Service: Cardiovascular;  Laterality: N/A; 01/10/2016: ELECTROPHYSIOLOGIC STUDY; N/A     Comment:  Procedure: CARDIOVERSION;  Surgeon: Iran Ouch,               MD;  Location: [facility] ORS;  Service: Cardiovascular;                Laterality: N/A; 2013: WRIST SURGERY; Left  BMI    Body Mass Index: 30.04 kg/m      Reproductive/Obstetrics negative OB ROS                             Anesthesia Physical Anesthesia Plan  ASA: 3  Anesthesia Plan: General   Post-op Pain Management:    Induction: Intravenous  PONV Risk Score and Plan: Propofol infusion and TIVA  Airway Management Planned: Natural Airway and Nasal Cannula  Additional Equipment:   Intra-op Plan:   Post-operative Plan:   Informed Consent: I have reviewed the patients History and Physical, chart, labs and discussed the procedure including the risks, benefits and alternatives for the proposed anesthesia with the patient or authorized representative who has indicated his/her understanding and acceptance.     Dental Advisory Given  Plan Discussed with: CRNA and Surgeon  Anesthesia Plan Comments:        Anesthesia Quick Evaluation

## 2023-07-30 NOTE — Telephone Encounter (Signed)
Pharmacy Patient Advocate Encounter  Received notification from [facility] that Prior Authorization for jardiance has been APPROVED from 07/30/23 to 07/28/24. Ran test claim, Copay is $138.16- one month. This test claim was processed through [facility]- copay amounts may vary at other pharmacies due to pharmacy/plan contracts, or as the patient moves through the different stages of their insurance plan.   PA #/Case ID/Reference #: [id number]

## 2023-07-30 NOTE — Anesthesia Procedure Notes (Signed)
Date/Time: 07/30/2023 12:38 PM  Performed by: Stormy Fabian, CRNAPre-anesthesia Checklist: Patient identified, Emergency Drugs available, Suction available and Patient being monitored Patient Re-evaluated:Patient Re-evaluated prior to induction Oxygen Delivery Method: Nasal cannula Induction Type: IV induction Dental Injury: Teeth and Oropharynx as per pre-operative assessment  Comments: Nasal cannula with etCO2 monitoring

## 2023-08-02 ENCOUNTER — Encounter: Payer: Self-pay | Admitting: Cardiology

## 2023-08-04 ENCOUNTER — Ambulatory Visit: Payer: 59 | Admitting: Medical

## 2023-08-09 ENCOUNTER — Telehealth: Payer: Self-pay | Admitting: Cardiovascular Disease

## 2023-08-09 MED ORDER — EMPAGLIFLOZIN 10 MG PO TABS: 10.0000 mg | ORAL_TABLET | Freq: Every day | ORAL | 0 refills | Status: DC

## 2023-08-09 NOTE — Telephone Encounter (Signed)
*  STAT* If patient is at the pharmacy, call can be transferred to refill team.   1. Which medications need to be refilled? (please list name of each medication and dose if known) empagliflozin (JARDIANCE) 10 MG TABS tablet    2. Would you like to learn more about the convenience, safety, & potential cost savings by using the [facility]? No     3. Are you open to using the [facility] (Type [facility]. No  ).   4. Which pharmacy/location (including street and city if local pharmacy) is medication to be sent to? [facility] [address], [place] - [address]     5. Do they need a 30 day or 90 day supply? 90

## 2023-08-09 NOTE — Telephone Encounter (Signed)
Disp Refills Start End   empagliflozin (JARDIANCE) 10 MG TABS tablet 90 tablet 0 08/09/2023 --   Sig - Route: Take 1 tablet (10 mg total) by mouth daily before breakfast. - Oral   Sent to pharmacy as: empagliflozin (JARDIANCE) 10 MG Tab tablet   E-Prescribing Status: Receipt confirmed by pharmacy (08/09/2023  1:55 PM EST)

## 2023-08-16 ENCOUNTER — Ambulatory Visit: Payer: 59 | Admitting: Student

## 2023-08-16 NOTE — Progress Notes (Addendum)
Cardiology Clinic Note   Date: 08/19/2023 ID: JAIMES DELOSANGELES, DOB 1960/11/25, MRN [id number]  Primary Cardiologist:  Lorine Bears, MD  Patient Profile    Walter Banks is a 62 y.o. male who presents to the clinic today for follow up after cardioversion.     Past medical history significant for: Nonobstructive CAD. R/LHC 12/10/2015: Ostial to proximal LAD 40%.  Severe left ventricular systolic dysfunction.  Severely elevated right heart pressures including wedge pressure. Exercise stress test 04/06/2017: Normal treadmill stress test with no evidence of ischemia.  Excellent exercise capacity.  No ST segment deviation/T wave inversion noted during stress.  BP demonstrated normal response to exercise. Chronic systolic heart failure/nonischemic cardiomyopathy. Echo 06/30/2023: EF 30 to 35%.  Global hypokinesis.  Indeterminate diastolic parameters.  Mildly reduced RV function.  Normal PA pressure.  Moderate LAE.  Mild to moderate MR.  Mild AI.  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 PAF. Onset ~March 2017. DCCV 01/10/2016. DCCV 03/26/2020. DCCV 07/30/2023. Subarachnoid hemorrhage.  In summary, patient was first evaluated by cardiology in March 2017 during hospital admission for congestive heart failure and new onset A-fib.  Echo at that time showed EF 20 to 25%, diffuse hypokinesis, moderate MR, severe BAE, mildly reduced RV function.  R/LHC showed mild LAD disease (details above) as well as severely elevated right heart pressures including wedge pressure.  He underwent successful cardioversion April 2017.  Repeat echo June 2017 demonstrated recovered LV function.  Patient was seen in the office August 2024 with reports of being off all medications for 1 year due to lack of insurance.  He had recently reestablished medical insurance.  EKG showed rate controlled A-fib.  Patient was restarted on Eliquis, Toprol, Entresto, spironolactone.  Repeat echo September 2024 showed EF 30 to 35% with  global hypokinesis (details above).     History of Present Illness    Walter Banks is followed by Dr. Kirke Corin for the above outlined history.  Patient was last seen in the office on 07/27/2023 by Cadence Fransico Michael, PA-C for follow-up.  He remains in rate controlled A-fib and was scheduled for cardioversion.  He underwent successful cardioversion 07/30/2023.  Discussed the use of AI scribe software for clinical note transcription with the patient, who gave verbal consent to proceed.  Patient is here alone. He is back in afib per EKG today. He does not have any cardiac awareness of afib. During exam patient states that he feels very nervous and like he is going to pass out. He laid back on exam table and I elevated his legs. He became profusely diaphoretic with continued complaints of feeling like he was going to pass out. Unable to obtain manual BP. Pulse oximeter showed HR 44 and SPO2 99%. Dynamap BP 80/52. FSBS 121. IV and fluids started. Repeat EKG showed afib with slow ventricular response, 46 bpm. BP improved to 92/52 then 93/66 and patient felt improved. Patient took morning medications and did not eat. He received a total of 750 mL of fluid with improved BP 106/73. Diaphoresis completely resolved.  He was able to sit up and ambulate with no lightheadedness, dizziness or unsteadiness. Decision made to send patient home. He was provided with ED precautions. He was provided with a work note for today and he is off work Advertising account executive.       ROS: All other systems reviewed and are otherwise negative except as noted in History of Present Illness.  Studies Reviewed    EKG Interpretation Date/Time:  Thursday  August 19 2023 10:48:20 EST Ventricular Rate:  62 PR Interval:    QRS Duration:  98 QT Interval:  448 QTC Calculation: 454 R Axis:   -9  Text Interpretation: Atrial fibrillation When compared with ECG of 19-Aug-2023 10:47, QT has lengthened Confirmed by Carlos Levering [phone]) on  08/19/2023 12:31:44 PM    Risk Assessment/Calculations     CHA2DS2-VASc Score = 2   This indicates a 2.2% annual risk of stroke. The patient's score is based upon: CHF History: 1 HTN History: 1 Diabetes History: 0 Stroke History: 0 Vascular Disease History: 0 Age Score: 0 Gender Score: 0             Physical Exam    VS:  BP 127/68 (BP Location: Left Arm, Patient Position: Sitting, Cuff Size: Normal)   Pulse 68   Ht 6\' 2"  (1.88 m)   Wt 235 lb 3.2 oz (106.7 kg)   SpO2 99%   BMI 30.20 kg/m  , BMI Body mass index is 30.2 kg/m.  GEN: Well nourished, well developed, in no acute distress. Neck: No JVD or carotid bruits. Cardiac: Irregular rhythm, regular rate. No murmurs. No rubs or gallops.   Respiratory:  Respirations regular and unlabored. Clear to auscultation without rales, wheezing or rhonchi. GI: Soft, nontender, nondistended. Extremities: Radials/DP/PT 2+ and equal bilaterally. No clubbing or cyanosis. No edema.  Skin: Profoundly diaphoretic on initial exam. Warm and dry on re-exam prior to discharge.  Neuro: Strength intact.  Assessment & Plan   Bradycardia/hypotension Patient became dizzy and profusely diaphoretic during exam. Patient reported taking morning medications and not eating.  Manual BP unable to be obtained. Pulse oximeter showed HR 44, 99%. Dynamap BP 80/52. FSBS 121. IV and NS started. Repeat EKG showed afib with slow ventricular response, 46 bpm. BP improved with fluids 92/52>>93/66. Patient reported resolution of dizziness. He was able to sit up and ambulate with no dizziness. Diaphoresis resolved. BP 106/73. He received a total of 750 mL NS. Decision made to allow patient to return home. ED precautions provided.  -Hold Entresto and spironolactone.  -Cut Toprol in half to 25 mg.  -CBC and BMP today.   PAF Onset March 2017.  DCCV April 2017, June 2021.  Last DCCV 07/30/2023.  Patient denies cardiac awareness of arrhythmia. EKG shows afib with  controlled rate. Denies spontaneous bleeding concerns. Irregular rhythm, regular rate on exam.  -Continue Toprol, Eliquis. -Refer to EP.   Nonobstructive CAD [facility] March 2017 demonstrated ostial to proximal LAD 40%.  Exercise stress test July 2018 was normal with excellent exercise capacity.  Patient denies chest pain, pressure or tightness.  -Continue Toprol. -Order PET stress test.   Chronic systolic heart failure/nonischemic cardiomyopathy Echo September 2024 showed EF 30 to 35%, global hypokinesis, mildly reduced RV function, normal PA pressure, moderate LAE, mild to moderate MR, mild AI, mild dilatation of ascending aorta 40 mm.  In August 2024 patient had been off all medications for 1 year secondary to lack of insurance.  Medications restarted at that time.  Patient denies lower extremity edema, DOE, orthopnea or PND.  Euvolemic on exam. -Hold Entresto and spironolactone as above.  -PET stress test for ischemic evaluation.  -Continue Toprol, Jardiance. -Consider referral to advanced heart failure clinic at follow up.     Disposition: CBC and BMP today. PET CT. Refer to EP. Hold Entresto and spironolactone secondary to hypotension. Decrease Toprol to 25 mg daily. Monitor BP at home. Return in 1 week or sooner as needed.  Informed Consent   Shared Decision Making/Informed Consent The risks [chest pain, shortness of breath, cardiac arrhythmias, dizziness, blood pressure fluctuations, myocardial infarction, stroke/transient ischemic attack, nausea, vomiting, allergic reaction, radiation exposure, metallic taste sensation and life-threatening complications (estimated to be 1 in 10,000)], benefits (risk stratification, diagnosing coronary artery disease, treatment guidance) and alternatives of a cardiac PET stress test were discussed in detail with Mr. Mcclurg and he agrees to proceed.      Signed, Etta Grandchild. Delia Slatten, DNP, NP-C

## 2023-08-19 ENCOUNTER — Ambulatory Visit: Payer: BC Managed Care – PPO | Attending: Student | Admitting: Student

## 2023-08-19 ENCOUNTER — Encounter: Payer: Self-pay | Admitting: Emergency Medicine

## 2023-08-19 ENCOUNTER — Encounter: Payer: Self-pay | Admitting: Student

## 2023-08-19 VITALS — BP 127/68 | HR 68 | Ht 74.0 in | Wt 235.2 lb

## 2023-08-19 DIAGNOSIS — R001 Bradycardia, unspecified: Secondary | ICD-10-CM | POA: Diagnosis not present

## 2023-08-19 DIAGNOSIS — I480 Paroxysmal atrial fibrillation: Secondary | ICD-10-CM

## 2023-08-19 DIAGNOSIS — I5022 Chronic systolic (congestive) heart failure: Secondary | ICD-10-CM | POA: Diagnosis not present

## 2023-08-19 DIAGNOSIS — I2510 Atherosclerotic heart disease of native coronary artery without angina pectoris: Secondary | ICD-10-CM | POA: Diagnosis not present

## 2023-08-19 DIAGNOSIS — I952 Hypotension due to drugs: Secondary | ICD-10-CM

## 2023-08-19 MED ORDER — METOPROLOL SUCCINATE ER 25 MG PO TB24: 25.0000 mg | ORAL_TABLET | Freq: Every day | ORAL | 3 refills | Status: DC

## 2023-08-19 MED ORDER — SODIUM CHLORIDE (PF) 0.9 % IJ SOLN: 1000.0000 mL | Freq: Once | INTRAMUSCULAR | Status: DC

## 2023-08-19 MED ORDER — SODIUM CHLORIDE 0.9 % IV SOLN: INTRAVENOUS | Status: AC

## 2023-08-19 NOTE — Patient Instructions (Signed)
Medication Instructions:  Your physician recommends the following medication changes.  STOP TAKING: Entresto Spironolactone  DECREASE: Metoprolol 25 mg daily  *If you need a refill on your cardiac medications before your next appointment, please call your pharmacy*   Lab Work: Your provider would like for you to have following labs drawn today CBC and BMP.   If you have labs (blood work) drawn today and your tests are completely normal, you will receive your results only by: MyChart Message (if you have MyChart) OR A paper copy in the mail If you have any lab test that is abnormal or we need to change your treatment, we will call you to review the results.   Testing/Procedures: How to Prepare for Your Cardiac PET/CT Stress Test:  1. Please do not take these medications before your test:   Medications that may interfere with the cardiac pharmacological stress agent (ex. nitrates - including erectile dysfunction medications, isosorbide mononitrate- [please start to hold this medication the day before the test], tamulosin or beta-blockers) the day of the exam. (Erectile dysfunction medication should be held for at least 72 hrs prior to test) Theophylline containing medications for 12 hours. Dipyridamole 48 hours prior to the test. Your remaining medications may be taken with water.  2. Nothing to eat or drink, except water, 3 hours prior to arrival time.   NO caffeine/decaffeinated products, or chocolate 12 hours prior to arrival.  3. NO perfume, cologne or lotion on chest or abdomen area.   4. Total time is 1 to 2 hours; you may want to bring reading material for the waiting time.  5. Please report to Radiology at [facility] Main Entrance, medical mall, 30 mins prior to your test.  [address]  [place], [place]  [phone]  In preparation for your appointment, medication and supplies will be purchased.  Appointment availability is limited, so  if you need to cancel or reschedule, please call the Radiology Department at [phone] [facility]) OR [phone] [facility])  24 hours in advance to avoid a cancellation fee of $100.00  What to Expect After you Arrive:  Once you arrive and check in for your appointment, you will be taken to a preparation room within the Radiology Department.  A technologist or Nurse will obtain your medical history, verify that you are correctly prepped for the exam, and explain the procedure.  Afterwards,  an IV will be started in your arm and electrodes will be placed on your skin for EKG monitoring during the stress portion of the exam. Then you will be escorted to the PET/CT scanner.  There, staff will get you positioned on the scanner and obtain a blood pressure and EKG.  During the exam, you will continue to be connected to the EKG and blood pressure machines.  A small, safe amount of a radioactive tracer will be injected in your IV to obtain a series of pictures of your heart along with an injection of a stress agent.    After your Exam:  It is recommended that you eat a meal and drink a caffeinated beverage to counter act any effects of the stress agent.  Drink plenty of fluids for the remainder of the day and urinate frequently for the first couple of hours after the exam.  Your doctor will inform you of your test results within 7-10 business days.  For more information and frequently asked questions, please visit our website : [email or web address]  For questions about your test or  how to prepare for your test, please call: Cardiac Imaging Nurse Navigators Office: [phone]    Follow-Up: At [facility], you and your health needs are our priority.  As part of our continuing mission to provide you with exceptional heart care, we have created designated Provider Care Teams.  These Care Teams include your primary Cardiologist (physician) and Advanced Practice Providers (APPs -   Physician Assistants and Nurse Practitioners) who all work together to provide you with the care you need, when you need it.  We recommend signing up for the patient portal called "MyChart".  Sign up information is provided on this After Visit Summary.  MyChart is used to connect with patients for Virtual Visits (Telemedicine).  Patients are able to view lab/test results, encounter notes, upcoming appointments, etc.  Non-urgent messages can be sent to your provider as well.   To learn more about what you can do with MyChart, go to [email or web address].    Your next appointment:   08/27/2023 at 8:25   Provider:   You may see Carlos Levering, NP   Other Instructions Referral to electrophysiology  -- Dr Lalla Brothers

## 2023-08-20 ENCOUNTER — Telehealth: Payer: Self-pay | Admitting: Student

## 2023-08-20 LAB — BASIC METABOLIC PANEL
BUN/Creatinine Ratio: 21 (ref 10–24)
BUN: 23 mg/dL (ref 8–27)
CO2: 22 mmol/L (ref 20–29)
Calcium: 9.4 mg/dL (ref 8.6–10.2)
Chloride: 101 mmol/L (ref 96–106)
Creatinine, Ser: 1.09 mg/dL (ref 0.76–1.27)
Glucose: 124 mg/dL — ABNORMAL HIGH (ref 70–99)
Potassium: 4.7 mmol/L (ref 3.5–5.2)
Sodium: 140 mmol/L (ref 134–144)
eGFR: 77 mL/min/{1.73_m2} (ref >59)

## 2023-08-20 LAB — CBC
Hematocrit: 44.1 % (ref 37.5–51.0)
Hemoglobin: 15.1 g/dL (ref 13.0–17.7)
MCH: 31.8 pg (ref 26.6–33.0)
MCHC: 34.2 g/dL (ref 31.5–35.7)
MCV: 93 fL (ref 79–97)
Platelets: 177 10*3/uL (ref 150–450)
RBC: 4.75 x10E6/uL (ref 4.14–5.80)
RDW: 13.1 % (ref 11.6–15.4)
WBC: 6.9 10*3/uL (ref 3.4–10.8)

## 2023-08-20 NOTE — Telephone Encounter (Signed)
Pt called in stating he no longer takes Keppra on his med list and I think it confused him. He did not seem sure on what he should and should not taking. Please advise if someone can call to go over med list.

## 2023-08-20 NOTE — Telephone Encounter (Signed)
The patient called for medication clarification. The patient was advised that, during the office visit on 08/19/23, Walter Banks recommended the following:  Stop taking: Entresto  Spiroloctone  Decrease: Metoprolol to 25 mg daily

## 2023-08-26 NOTE — Progress Notes (Signed)
Cardiology Clinic Note   Date: 08/27/2023 ID: Walter Banks, DOB Sep 20, 1961, MRN [id number]  Primary Cardiologist:  Lorine Bears, MD  Patient Profile    Walter Banks is a 62 y.o. male who presents to the clinic today for follow up.     Past medical history significant for: Nonobstructive CAD. R/LHC 12/10/2015: Ostial to proximal LAD 40%.  Severe left ventricular systolic dysfunction.  Severely elevated right heart pressures including wedge pressure. Exercise stress test 04/06/2017: Normal treadmill stress test with no evidence of ischemia.  Excellent exercise capacity.  No ST segment deviation/T wave inversion noted during stress.  BP demonstrated normal response to exercise. Chronic systolic heart failure/nonischemic cardiomyopathy. Echo 06/30/2023: EF 30 to 35%.  Global hypokinesis.  Indeterminate diastolic parameters.  Mildly reduced RV function.  Normal PA pressure.  Moderate LAE.  Mild to moderate MR.  Mild AI.  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 PAF. Onset ~March 2017. DCCV 01/10/2016. DCCV 03/26/2020. DCCV 07/30/2023. Subarachnoid hemorrhage.  In summary, patient was first evaluated by cardiology in March 2017 during hospital admission for congestive heart failure and new onset A-fib.  Echo at that time showed EF 20 to 25%, diffuse hypokinesis, moderate MR, severe BAE, mildly reduced RV function.  R/LHC showed mild LAD disease (details above) as well as severely elevated right heart pressures including wedge pressure.  He underwent successful cardioversion April 2017.  Repeat echo June 2017 demonstrated recovered LV function.  Patient was seen in the office August 2024 with reports of being off all medications for 1 year due to lack of insurance.  He had recently reestablished medical insurance.  EKG showed rate controlled A-fib.  Patient was restarted on Eliquis, Toprol, Entresto, spironolactone.  Repeat echo September 2024 showed EF 30 to 35% with global hypokinesis  (details above).  Patient underwent successful cardioversion on 07/30/2023.      History of Present Illness    Walter Banks is followed by Dr. Kirke Corin for the above outlined history.   Patient was last seen in the office by me on 08/19/2023 for follow-up post cardioversion.  Back in A-fib with controlled rate.  During exam patient became profusely diaphoretic, bradycardic and hypotensive.  Blood sugar was normal.  Patient had taken all medications but did not eat.  He received 750 mL of fluid with resolution of diaphoresis and improved BP.  Decision was made to allow patient to return home with ED precautions.  He was instructed to stop Entresto and spironolactone and decrease Toprol to 25 mg daily.  PET/CT ordered.  He was scheduled for close follow-up.  Discussed the use of AI scribe software for clinical note transcription with the patient, who gave verbal consent to proceed.  The patient presents for a follow-up visit. BP has consistently been >/= 120/70 and heart rate has typically been in the 60-70s. He has had no further episodes of lightheadedness or diaphoresis. Patient denies shortness of breath, dyspnea on exertion, lower extremity edema, orthopnea or PND. No chest pain, pressure, or tightness. No palpitations.  Daily weights have been stable. He continues to make positive dietary changes by cutting out fast food and limiting portions. He utilizes his air fryer and choses lean meats.         ROS: All other systems reviewed and are otherwise negative except as noted in History of Present Illness.  Studies Reviewed    EKG Interpretation Date/Time:  Friday August 27 2023 08:28:02 EST Ventricular Rate:  49 PR Interval:  QRS Duration:  108 QT Interval:  428 QTC Calculation: 386 R Axis:   -27  Text Interpretation: Atrial fibrillation with slow ventricular response When compared with ECG of 19-Aug-2023 10:48, QT has shortened Confirmed by Carlos Levering [phone]) on  08/27/2023 8:36:41 AM    Risk Assessment/Calculations     CHA2DS2-VASc Score = 2   This indicates a 2.2% annual risk of stroke. The patient's score is based upon: CHF History: 1 HTN History: 1 Diabetes History: 0 Stroke History: 0 Vascular Disease History: 0 Age Score: 0 Gender Score: 0             Physical Exam    VS:  BP 110/72 (BP Location: Left Arm, Patient Position: Sitting, Cuff Size: Normal)   Pulse (!) 49   Ht 6\' 2"  (1.88 m)   Wt 236 lb 12.8 oz (107.4 kg)   SpO2 98%   BMI 30.40 kg/m  , BMI Body mass index is 30.4 kg/m.  GEN: Well nourished, well developed, in no acute distress. Neck: No JVD or carotid bruits. Cardiac: Irregular rhythm, regular rate. No murmurs. No rubs or gallops.   Respiratory:  Respirations regular and unlabored. Clear to auscultation without rales, wheezing or rhonchi. GI: Soft, nontender, nondistended. Extremities: Radials/DP/PT 2+ and equal bilaterally. No clubbing or cyanosis. No edema.  Skin: Warm and dry, no rash. Neuro: Strength intact.  Assessment & Plan      PAF Onset March 2017.  DCCV April 2017, June 2021.  DCCV 07/30/2023.  Patient denies cardiac awareness of arrhythmia. EKG shows afib with slow ventricular response. He is tolerating work and home activities without dyspnea or fatigue. Denies spontaneous bleeding concerns. Irregular rhythm, regular rate on exam.  -Continue Toprol, Eliquis. -Will plan to see Dr. Lalla Brothers on 10/27/2023.    Nonobstructive CAD [facility] March 2017 demonstrated ostial to proximal LAD 40%.  Exercise stress test July 2018 was normal with excellent exercise capacity.  Patient denies chest pain, pressure or tightness.  -Continue Toprol. -PET stress test scheduled for 09/09/2023.    Chronic systolic heart failure/nonischemic cardiomyopathy Echo September 2024 showed EF 30 to 35%, global hypokinesis, mildly reduced RV function, normal PA pressure, moderate LAE, mild to moderate MR, mild AI, mild dilatation  of ascending aorta 40 mm.  In August 2024 patient had been off all medications for 1 year secondary to lack of insurance.  Medications restarted at that time.  Patient denies lower extremity edema, DOE, orthopnea or PND.  Euvolemic on exam. -Restart Entresto 24-26 mg bid. Continue to monitor BP. Contact office for hypotension.  -Continue daily weight. Contact office for weight gain or edema. -PET stress test for ischemic evaluation.  -Continue Toprol, Jardiance. -Will hold off on heart failure clinic referral for now in favor of close follow up.          Disposition: Keep previously scheduled visit with Dr. Lalla Brothers on 10/27/2023. Return in 2 months or sooner as needed.          Signed, Etta Grandchild. Artisha Capri, DNP, NP-C

## 2023-08-27 ENCOUNTER — Encounter: Payer: Self-pay | Admitting: Student

## 2023-08-27 ENCOUNTER — Ambulatory Visit: Payer: 59 | Attending: Student | Admitting: Student

## 2023-08-27 VITALS — BP 110/72 | HR 49 | Ht 74.0 in | Wt 236.8 lb

## 2023-08-27 DIAGNOSIS — I2510 Atherosclerotic heart disease of native coronary artery without angina pectoris: Secondary | ICD-10-CM | POA: Diagnosis not present

## 2023-08-27 DIAGNOSIS — R001 Bradycardia, unspecified: Secondary | ICD-10-CM | POA: Diagnosis not present

## 2023-08-27 DIAGNOSIS — I4819 Other persistent atrial fibrillation: Secondary | ICD-10-CM | POA: Diagnosis not present

## 2023-08-27 DIAGNOSIS — I5022 Chronic systolic (congestive) heart failure: Secondary | ICD-10-CM

## 2023-08-27 MED ORDER — ENTRESTO 24-26 MG PO TABS: 1.0000 | ORAL_TABLET | Freq: Two times a day (BID) | ORAL | Status: DC

## 2023-08-27 NOTE — Patient Instructions (Signed)
Medication Instructions:  Your physician has recommended you make the following change in your medication:   RESTART Entresto 24-26 mg twice daily   Monitor daily blood pressure and weights  *If you need a refill on your cardiac medications before your next appointment, please call your pharmacy*   Lab Work: None  If you have labs (blood work) drawn today and your tests are completely normal, you will receive your results only by: MyChart Message (if you have MyChart) OR A paper copy in the mail If you have any lab test that is abnormal or we need to change your treatment, we will call you to review the results.   Testing/Procedures: None   Follow-Up: At [facility], you and your health needs are our priority.  As part of our continuing mission to provide you with exceptional heart care, we have created designated Provider Care Teams.  These Care Teams include your primary Cardiologist (physician) and Advanced Practice Providers (APPs -  Physician Assistants and Nurse Practitioners) who all work together to provide you with the care you need, when you need it.   Your next appointment:   2 month(s)  Provider:   Lorine Bears, MD or Carlos Levering, NP

## 2023-09-09 ENCOUNTER — Ambulatory Visit: Payer: 59

## 2023-10-07 ENCOUNTER — Ambulatory Visit: Payer: BC Managed Care – PPO

## 2023-10-19 ENCOUNTER — Encounter (HOSPITAL_COMMUNITY): Payer: Self-pay

## 2023-10-20 ENCOUNTER — Telehealth (HOSPITAL_COMMUNITY): Payer: Self-pay | Admitting: *Deleted

## 2023-10-20 NOTE — Telephone Encounter (Signed)
 Patient returning call about his upcoming cardiac imaging study; pt verbalizes understanding of appt date/time, parking situation and where to check in, pre-test NPO status; name and call back number provided for further questions should they arise  Chase Copping RN Navigator Cardiac Imaging [facility] [phone] office [phone] cell  Patient aware to avoid caffeine 12 hours prior to his cardiac PET scan.

## 2023-10-20 NOTE — Telephone Encounter (Signed)
 Attempted to call patient regarding upcoming cardiac PET appointment. Left message on voicemail with name and callback number Johney Frame RN Navigator Cardiac Imaging [facility] [phone] Office  Advised to avoid caffeine for 12 hours prior to test.

## 2023-10-21 ENCOUNTER — Ambulatory Visit
Admission: RE | Admit: 2023-10-21 | Discharge: 2023-10-21 | Disposition: A | Discharge status: Home / Self Care | Payer: BC Managed Care – PPO | Source: Ambulatory Visit | Attending: Student | Admitting: Student

## 2023-10-21 DIAGNOSIS — I480 Paroxysmal atrial fibrillation: Secondary | ICD-10-CM | POA: Insufficient documentation

## 2023-10-21 DIAGNOSIS — I2510 Atherosclerotic heart disease of native coronary artery without angina pectoris: Secondary | ICD-10-CM | POA: Insufficient documentation

## 2023-10-21 DIAGNOSIS — I4892 Unspecified atrial flutter: Secondary | ICD-10-CM | POA: Diagnosis not present

## 2023-10-21 DIAGNOSIS — I517 Cardiomegaly: Secondary | ICD-10-CM | POA: Diagnosis present

## 2023-10-21 DIAGNOSIS — K760 Fatty (change of) liver, not elsewhere classified: Secondary | ICD-10-CM | POA: Insufficient documentation

## 2023-10-21 LAB — NM PET CT CARDIAC PERFUSION MULTI W/ABSOLUTE BLOODFLOW
LV dias vol: 215 mL (ref 62–150)
LV sys vol: 162 mL
Nuc Stress EF: 25 %
Peak HR: 67 {beats}/min
Rest HR: 70 {beats}/min
Rest Nuclear Isotope Dose: 24.9 mCi
SRS: 0
SSS: 10
Stress Nuclear Isotope Dose: 24.9 mCi

## 2023-10-21 MED ORDER — REGADENOSON 0.4 MG/5ML IV SOLN
0.4000 mg | Freq: Once | INTRAVENOUS | Status: AC
  Administered 2023-10-21: 0.4 mg via INTRAVENOUS
  Filled 2023-10-21: qty 5

## 2023-10-21 MED ORDER — RUBIDIUM RB82 GENERATOR (RUBYFILL)
25.0000 | PACK | Freq: Once | INTRAVENOUS | Status: AC
  Administered 2023-10-21: 24.93 via INTRAVENOUS

## 2023-10-21 MED ORDER — REGADENOSON 0.4 MG/5ML IV SOLN
INTRAVENOUS | Status: AC
Start: 2023-10-21 — End: ?
  Filled 2023-10-21: qty 5

## 2023-10-21 MED ORDER — RUBIDIUM RB82 GENERATOR (RUBYFILL)
25.0000 | PACK | Freq: Once | INTRAVENOUS | Status: AC
  Administered 2023-10-21: 24.91 via INTRAVENOUS

## 2023-10-21 NOTE — Progress Notes (Signed)
10:04a BP 108/68 HR 60 - 60cc fluid infused from 500cc NS bolus. Patient awake and ambulatory to PET/CT scan room ,steady gait, denies dizziness.   Proceeding with cardiac PET stress test  Rockwell Alexandria RN Navigator Cardiac Imaging [facility] [phone] Office  [phone] Cell

## 2023-10-21 NOTE — Progress Notes (Signed)
Pt arrived to [facility] cardiac PET appt ambulatory, steady gait. Pt taken to prep room for vitals and IV placement. BP 111/86 HR 56. IV was placed and patient began to feel syncopal, pale, diaphoretic.  Patient placed in reclined position with feet elevated, cool wash cloth to face. BP down to 68/50 and HR 49.   Phone call placed to reader who suggested supportive therapies with IV hydration to bring BP up for test. Suggested proceeding with test since IV is established and patient is here. Patient agreed to proceed.   500cc NS bolus started.   New BP 93/56 HR 66  Will continue to monitor.  Rockwell Alexandria RN Navigator Cardiac Imaging [facility] [phone] Office  [phone] Cell

## 2023-10-21 NOTE — Progress Notes (Signed)
Pt completed test diaphoretic. Experienced difficulty getting BP on case machine while lying so sat him up, pt felt okay at first and then felt syncopal again, requesting to lie down. Unable to obtain BP on case machine.  Obtained a chair on wheels to take him to recovery room to assesses vitals with BP machine. BP 108/51   Pt given shasta cola (completed by patient) and assisted to standing position to do a walk test. Patient ambulated within PET department with steady gait, denying dizziness.   Pts IV was removed and escorted to lobby with RN.   Pt appreciated the care given today.  Rockwell Alexandria RN Navigator Cardiac Imaging [facility] [phone] Office  [phone] Cell

## 2023-10-27 ENCOUNTER — Institutional Professional Consult (permissible substitution): Payer: Self-pay | Admitting: Cardiology

## 2023-10-28 ENCOUNTER — Ambulatory Visit: Payer: 59 | Admitting: Student

## 2023-10-31 NOTE — Progress Notes (Unsigned)
Cardiology Clinic Note   Date: 11/02/2023 ID: Walter Banks, DOB [birth year], MRN [id number]  Primary Cardiologist:  Lorine Bears, MD  Patient Profile    Walter Banks is a 63 y.o. male who presents to the clinic today for routine follow up.     Past medical history significant for: Nonobstructive CAD. R/LHC 12/10/2015: Ostial to proximal LAD 40%.  Severe left ventricular systolic dysfunction.  Severely elevated right heart pressures including wedge pressure. Cardiac PET/CT 10/21/2023: Normal LV perfusion.  No evidence of ischemia.  No evidence of infarction.  Stress EF 25%.  High risk study due to severity of reduced LVEF. Chronic systolic heart failure/nonischemic cardiomyopathy. Echo 06/30/2023: EF 30 to 35%.  Global hypokinesis.  Indeterminate diastolic parameters.  Mildly reduced RV function.  Normal PA pressure.  Moderate LAE.  Mild to moderate MR.  Mild AI.  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 PAF. Onset ~March 2017. DCCV 01/10/2016. DCCV 03/26/2020. DCCV 07/30/2023. Subarachnoid hemorrhage.  In summary, patient was first evaluated by cardiology in March 2017 during hospital admission for congestive heart failure and new onset A-fib.  Echo at that time showed EF 20 to 25%, diffuse hypokinesis, moderate MR, severe BAE, mildly reduced RV function.  R/LHC showed mild LAD disease (details above) as well as severely elevated right heart pressures including wedge pressure.  He underwent successful cardioversion April 2017.  Repeat echo June 2017 demonstrated recovered LV function.  Patient was seen in the office August 2024 with reports of being off all medications for 1 year due to lack of insurance.  He had recently reestablished medical insurance.  EKG showed rate controlled A-fib.  Patient was restarted on Eliquis, Toprol, Entresto, spironolactone.  Repeat echo September 2024 showed EF 30 to 35% with global hypokinesis (details above).  Patient underwent successful  cardioversion on 07/30/2023.   Patient was in the office in November 2024 for follow-up post cardioversion. Back in A-fib with controlled rate. During exam patient became profusely diaphoretic, bradycardic and hypotensive. Blood sugar was normal. Patient had taken all medications but did not eat. He received 750 mL of fluid with resolution of diaphoresis and improved BP. Decision was made to allow patient to return home with ED precautions. He was instructed to stop Entresto and spironolactone and decrease Toprol to 25 mg daily. PET/CT ordered. He was scheduled for close follow-up.  Patient was seen in follow-up the following week and was doing well with no further episodes of hypotension/bradycardia.  Home BP readings consistently >/= 120/70 and HR 60 to 70s.  He was restarted on low-dose Entresto.     History of Present Illness    Walter Banks is followed by Dr. Kirke Corin for the above outlined history.   Today, patient reports he is doing well. Patient denies shortness of breath, dyspnea on exertion, lower extremity edema, abdominal bloating/fullness, orthopnea or PND. No chest pain, pressure, or tightness. No palpitations. Patient has no cardiac awareness of afib.  His appetite has increased so he is eating more but making smart food choices. His weight has gone up but he has not experienced any big increases in daily weight. BP at home typically 120s/75-80 and HR 70s. He is tolerating work activities without difficulty.     ROS: All other systems reviewed and are otherwise negative except as noted in History of Present Illness.  EKGs/Labs Reviewed    EKG Interpretation Date/Time:  Tuesday November 02 2023 10:32:59 EST Ventricular Rate:  65 PR Interval:    QRS  Duration:  104 QT Interval:  392 QTC Calculation: 407 R Axis:   -25  Text Interpretation: Atrial fibrillation with premature ventricular or aberrantly conducted complexes When compared with ECG of 27-Aug-2023 08:28, No  significant change was found Confirmed by Carlos Levering [phone]) on 11/02/2023 10:40:21 AM   06/01/2023: ALT 24; AST 22 08/19/2023: BUN 23; Creatinine, Ser 1.09; Potassium 4.7; Sodium 140   08/19/2023: Hemoglobin 15.1; WBC 6.9   06/01/2023: TSH 2.630    Risk Assessment/Calculations     CHA2DS2-VASc Score = 2   This indicates a 2.2% annual risk of stroke. The patient's score is based upon: CHF History: 1 HTN History: 1 Diabetes History: 0 Stroke History: 0 Vascular Disease History: 0 Age Score: 0 Gender Score: 0             Physical Exam    VS:  BP 104/76 (Cuff Size: Normal)   Pulse 73   Ht 6\' 2"  (1.88 m)   Wt 244 lb 6.4 oz (110.9 kg)   SpO2 96%   BMI 31.38 kg/m  , BMI Body mass index is 31.38 kg/m.  GEN: Well nourished, well developed, in no acute distress. Neck: No JVD or carotid bruits. Cardiac: Irregular rhythm, regular rate. No murmurs. No rubs or gallops.   Respiratory:  Respirations regular and unlabored. Clear to auscultation without rales, wheezing or rhonchi. GI: Soft, nontender, nondistended. Extremities: Radials/DP/PT 2+ and equal bilaterally. No clubbing or cyanosis. No edema.  Skin: Warm and dry, no rash. Neuro: Strength intact.  Assessment & Plan   PAF Onset March 2017.  DCCV April 2017, June 2021.  DCCV 07/30/2023.  Patient denies cardiac awareness of arrhythmia. He is tolerating work and home activities without dyspnea or fatigue. Denies spontaneous bleeding concerns. Irregular rhythm, regular rate on exam.  EKG shows afib, 65 bpm.  -Continue Toprol, Eliquis. -Will plan to see Dr. Lalla Brothers on 11/05/2023.    Nonobstructive CAD [facility] March 2017 demonstrated ostial to proximal LAD 40%.  PET stress 10/21/2023 without evidence of ischemia or infarct, high risk secondary to severely reduced LVEF.  Patient denies chest pain, pressure or tightness. -Continue Toprol.   Chronic systolic heart failure/nonischemic cardiomyopathy Echo September 2024 showed  EF 30 to 35%, global hypokinesis, mildly reduced RV function, normal PA pressure, moderate LAE, mild to moderate MR, mild AI, mild dilatation of ascending aorta 40 mm.  In August 2024 patient had been off all medications for 1 year secondary to lack of insurance.  Medications restarted at that time.  Patient denies lower extremity edema, abdominal fullness/bloating, DOE, orthopnea or PND. Home weight stable. Euvolemic on exam. -Contact office for hypotension.  -Continue daily weight. Contact office for weight gain or edema. -Continue Toprol, Jardiance, Entresto. GDMT limited secondary to soft BP (104/76 today).   Disposition: Keep previously scheduled visit with EP. Return in 3-4 months or sooner as needed.          Signed, Etta Grandchild. Raelynn Corron, DNP, NP-C

## 2023-11-02 ENCOUNTER — Encounter: Payer: Self-pay | Admitting: Student

## 2023-11-02 ENCOUNTER — Ambulatory Visit: Payer: BC Managed Care – PPO | Attending: Student | Admitting: Student

## 2023-11-02 VITALS — BP 104/76 | HR 73 | Ht 74.0 in | Wt 244.4 lb

## 2023-11-02 DIAGNOSIS — I4819 Other persistent atrial fibrillation: Secondary | ICD-10-CM | POA: Diagnosis not present

## 2023-11-02 DIAGNOSIS — I428 Other cardiomyopathies: Secondary | ICD-10-CM

## 2023-11-02 DIAGNOSIS — I5022 Chronic systolic (congestive) heart failure: Secondary | ICD-10-CM | POA: Diagnosis not present

## 2023-11-02 DIAGNOSIS — I2510 Atherosclerotic heart disease of native coronary artery without angina pectoris: Secondary | ICD-10-CM | POA: Diagnosis not present

## 2023-11-02 DIAGNOSIS — R001 Bradycardia, unspecified: Secondary | ICD-10-CM

## 2023-11-02 NOTE — Patient Instructions (Signed)
Medication Instructions:  Your Physician recommend you continue on your current medication as directed.    *If you need a refill on your cardiac medications before your next appointment, please call your pharmacy*   Lab Work: None ordered at this time    Follow-Up: At [facility], you and your health needs are our priority.  As part of our continuing mission to provide you with exceptional heart care, we have created designated Provider Care Teams.  These Care Teams include your primary Cardiologist (physician) and Advanced Practice Providers (APPs -  Physician Assistants and Nurse Practitioners) who all work together to provide you with the care you need, when you need it.  We recommend signing up for the patient portal called "MyChart".  Sign up information is provided on this After Visit Summary.  MyChart is used to connect with patients for Virtual Visits (Telemedicine).  Patients are able to view lab/test results, encounter notes, upcoming appointments, etc.  Non-urgent messages can be sent to your provider as well.   To learn more about what you can do with MyChart, go to [email or web address].    Your next appointment:   3 month(s)  Provider:   You may see Lorine Bears, MD or one of the following Advanced Practice Providers on your designated Care Team:   Carlos Levering, NP

## 2023-11-04 NOTE — Progress Notes (Signed)
Electrophysiology Office Note:    Date:  11/05/2023   ID:  Walter Banks, DOB [birth year], MRN [id number]  [facility] Cardiologist:  Walter Bears, MD  [facility] Electrophysiologist:  Walter Prude, MD   Referring MD: Walter Levering, NP   Chief Complaint: Atrial fibrillation  History of Present Illness:    Walter Banks is a 63 year old man who I am seeing today for an evaluation of atrial fibrillation at the request of Walter Banks.  The patient also has a history of vasovagal syncope, especially in healthcare settings and chronic systolic heart failure.  His atrial fibrillation dates back to 2017.  He has had multiple cardioversions.  He takes Eliquis for stroke prophylaxis. Previous medical therapy has been complicated by lack of insurance.  Today the patient reports fatigue while in atrial fibrillation.  He does not appreciate the palpitations.  He reports his sister who is 1 year older than him has a similar cardiac condition with a reduced pump function.  She is being treated medically.  His father had a defibrillator implanted around the age of 9.      Their past medical, social and family history was reviewed.   ROS:   Please see the history of present illness.    All other systems reviewed and are negative.  EKGs/Labs/Other Studies Reviewed:    The following studies were reviewed today:  October 21, 2023 PET CT cardiac No ischemia, no infarction EF 25%  June 30, 2023 echo EF 30 to 35% Global hypokinesis Mildly reduced RV Moderately dilated left atrium Mild to moderate MR  December 10, 2015 left and right heart catheterization Mild proximal LAD disease  November 02, 2023 EKG shows atrial fibrillation, ventricular rate 65 bpm       Physical Exam:    VS:  BP (!) 100/58 (BP Location: Left Arm, Patient Position: Sitting, Cuff Size: Large)   Pulse 76   Ht 6\' 2"  (1.88 m)   Wt 242 lb 9.6 oz (110 kg)   SpO2 97%   BMI 31.15 kg/m      Wt Readings from Last 3 Encounters:  11/05/23 242 lb 9.6 oz (110 kg)  11/02/23 244 lb 6.4 oz (110.9 kg)  08/27/23 236 lb 12.8 oz (107.4 kg)     GEN: no distress CARD: Irregularly irregular, No MRG RESP: No IWOB. CTAB.        ASSESSMENT AND PLAN:    1. Chronic systolic heart failure (HCC)   2. Persistent atrial fibrillation (HCC)     #Persistent atrial fibrillation Associated with reduced left ventricular function. On Eliquis for stroke prophylaxis I do think rhythm control be important for Walter Banks given his systolic heart failure history.  I would like to first get the cardiac MRI results as below to determine whether or not he needs an ICD.  Will plan to address rhythm control at the next appointment.  #Chronic systolic heart failure NYHA class II.  Warm and dry on exam.  EF 30 to 35%.  No significant obstructive coronary disease on a 2017 left heart cath.  Recent PET CT showed no ischemia or infarction.  I suspect he has nonischemic cardiomyopathy.  Potentially worsened/exacerbated by his atrial fibrillation.  We discussed the data supporting rhythm control in patients with atrial fibrillation and systolic heart failure during today's visit.  I would like to perform a cardiac MRI to assess his LV function and for any evidence of LGE.  We discussed the role of defibrillator therapy during  today's visit.  If his ejection fraction is still reduced on the cardiac MRI, I would recommend defibrillator therapy given his young age.  A transvenous device would be preferred given his bradycardia if necessary.    Follow-up with me in 6 to 8 weeks to review results of cardiac MRI.   Signed, Walter Banks. Walter Brothers, MD, [facility], [facility] 11/05/2023 9:04 AM    Electrophysiology [facility]

## 2023-11-05 ENCOUNTER — Encounter: Payer: Self-pay | Admitting: Cardiology

## 2023-11-05 ENCOUNTER — Ambulatory Visit: Payer: BC Managed Care – PPO | Attending: Cardiology | Admitting: Cardiology

## 2023-11-05 VITALS — BP 100/58 | HR 76 | Ht 74.0 in | Wt 242.6 lb

## 2023-11-05 DIAGNOSIS — I5022 Chronic systolic (congestive) heart failure: Secondary | ICD-10-CM

## 2023-11-05 DIAGNOSIS — I4819 Other persistent atrial fibrillation: Secondary | ICD-10-CM

## 2023-11-05 NOTE — Patient Instructions (Signed)
Medication Instructions:  Your physician recommends that you continue on your current medications as directed. Please refer to the Current Medication list given to you today.  *If you need a refill on your cardiac medications before your next appointment, please call your pharmacy*  Testing/Procedures: Cardiac MRI Your physician has requested that you have a cardiac MRI. Cardiac MRI uses a computer to create images of your heart as its beating, producing both still and moving pictures of your heart and major blood vessels. For further information please visit [email or web address]. Please follow the instruction sheet given to you today for more information.   Follow-Up: At [facility], you and your health needs are our priority.  As part of our continuing mission to provide you with exceptional heart care, we have created designated Provider Care Teams.  These Care Teams include your primary Cardiologist (physician) and Advanced Practice Providers (APPs -  Physician Assistants and Nurse Practitioners) who all work together to provide you with the care you need, when you need it.  Your next appointment:   6-8 weeks  Provider:   Steffanie Dunn, MD

## 2023-12-10 ENCOUNTER — Telehealth: Payer: Self-pay | Admitting: Cardiovascular Disease

## 2023-12-10 ENCOUNTER — Encounter (HOSPITAL_COMMUNITY): Payer: Self-pay

## 2023-12-10 DIAGNOSIS — I4891 Unspecified atrial fibrillation: Secondary | ICD-10-CM

## 2023-12-10 NOTE — Telephone Encounter (Signed)
 Caller checking to see if we received fax for therapy change for Eliquis.Ref #[id number]

## 2023-12-10 NOTE — Telephone Encounter (Signed)
 Called pt to clarify message and determine if he needs a refill, no answer. Left message on voicemail.

## 2023-12-14 ENCOUNTER — Ambulatory Visit (HOSPITAL_COMMUNITY): Admission: RE | Admit: 2023-12-14 | Payer: BC Managed Care – PPO | Source: Ambulatory Visit

## 2023-12-28 ENCOUNTER — Telehealth: Payer: Self-pay

## 2023-12-28 NOTE — Telephone Encounter (Signed)
 Left message for patient to call back to reschedule his appointment with Dr. Lalla Brothers until after her MRI.

## 2023-12-29 ENCOUNTER — Ambulatory Visit: Payer: BC Managed Care – PPO | Attending: Cardiology | Admitting: Cardiology

## 2023-12-30 ENCOUNTER — Telehealth (HOSPITAL_COMMUNITY): Payer: Self-pay | Admitting: *Deleted

## 2023-12-30 NOTE — Telephone Encounter (Signed)
 Attempted to call patient regarding upcoming cardiac MRI appointment. Left message on voicemail with name and callback number  Larey Brick RN Navigator Cardiac Imaging [facility] [phone] Office [phone] Cell

## 2023-12-31 ENCOUNTER — Ambulatory Visit (HOSPITAL_COMMUNITY)
Admission: RE | Admit: 2023-12-31 | Discharge: 2023-12-31 | Disposition: A | Discharge status: Home / Self Care | Source: Ambulatory Visit | Attending: Cardiology | Admitting: Cardiology

## 2023-12-31 ENCOUNTER — Other Ambulatory Visit: Payer: Self-pay | Admitting: Cardiology

## 2023-12-31 DIAGNOSIS — I5022 Chronic systolic (congestive) heart failure: Secondary | ICD-10-CM | POA: Insufficient documentation

## 2023-12-31 DIAGNOSIS — I4819 Other persistent atrial fibrillation: Secondary | ICD-10-CM | POA: Insufficient documentation

## 2023-12-31 MED ORDER — GADOBUTROL 1 MMOL/ML IV SOLN
10.0000 mL | Freq: Once | INTRAVENOUS | Status: AC | PRN
  Administered 2023-12-31: 10 mL via INTRAVENOUS

## 2024-01-01 ENCOUNTER — Encounter: Payer: Self-pay | Admitting: Cardiology

## 2024-01-12 NOTE — Telephone Encounter (Signed)
 Pt returning call

## 2024-01-12 NOTE — Telephone Encounter (Signed)
 Left message for patient to call back

## 2024-01-13 NOTE — Telephone Encounter (Signed)
 Patient is scheduled for an appointment on 4/30 at 8:40am with Dr. Lalla Brothers in [place]

## 2024-01-13 NOTE — Telephone Encounter (Signed)
 Left message with appointment information--4/30 at 8:40 AM with Dr. Lalla Brothers in [place].

## 2024-01-13 NOTE — Telephone Encounter (Signed)
 Patient is returning call. He says he goes to work from 12:00 PM - 12:00 AM. If possible, he would like a call back before 12:00 AM, or he would like to schedule at appointment for a morning around 9:00 AM to review results.

## 2024-01-29 NOTE — Progress Notes (Deleted)
 Cardiology Clinic Note   Date: 01/29/2024 ID: Walter Banks, DOB [birth year], MRN [id number]  Primary Cardiologist:  Antionette Kirks, MD  Chief Complaint   Walter Banks is a 63 y.o. male who presents to the clinic today for ***  Patient Profile   Walter Banks is followed by *** for the history outlined below.      Past medical history significant for: Nonobstructive CAD. R/LHC 12/10/2015: Ostial to proximal LAD 40%.  Severe left ventricular systolic dysfunction.  Severely elevated right heart pressures including wedge pressure. Cardiac PET/CT 10/21/2023: Normal LV perfusion.  No evidence of ischemia.  No evidence of infarction.  Stress EF 25%.  High risk study due to severity of reduced LVEF. Chronic systolic heart failure/nonischemic cardiomyopathy. Echo 06/30/2023: EF 30 to 35%.  Global hypokinesis.  Indeterminate diastolic parameters.  Mildly reduced RV function.  Normal PA pressure.  Moderate LAE.  Mild to moderate MR.  Mild AI.  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 Cardiac MRI 12/31/2023: Moderate to severely reduced LV systolic function, LVEF 32% with normal LV chamber size.  No definite delayed myocardial enhancement, no myocardial edema.  Findings most consistent with nonischemic cardiomyopathy.  Mildly reduced RV systolic function, RVEF 45% with normal RV chamber size.  No LGE.  Mild MR. PAF. Onset ~March 2017. DCCV 01/10/2016. DCCV 03/26/2020. DCCV 07/30/2023. Subarachnoid hemorrhage.  In summary, patient was first evaluated by cardiology in March 2017 during hospital admission for congestive heart failure and new onset A-fib.  Echo at that time showed EF 20 to 25%, diffuse hypokinesis, moderate MR, severe BAE, mildly reduced RV function.  R/LHC showed mild LAD disease (details above) as well as severely elevated right heart pressures including wedge pressure.  He underwent successful cardioversion April 2017.  Repeat echo June 2017 demonstrated recovered LV  function.  Patient was seen in the office August 2024 with reports of being off all medications for 1 year due to lack of insurance.  He had recently reestablished medical insurance.  EKG showed rate controlled A-fib.  Patient was restarted on Eliquis , Toprol , Entresto , spironolactone .  Repeat echo September 2024 showed EF 30 to 35% with global hypokinesis (details above).  Patient underwent successful cardioversion on 07/30/2023.   Patient was in the office in November 2024 for follow-up post cardioversion. Back in A-fib with controlled rate. During exam patient became profusely diaphoretic, bradycardic and hypotensive. Blood sugar was normal. Patient had taken all medications but did not eat. He received 750 mL of fluid with resolution of diaphoresis and improved BP. Decision was made to allow patient to return home with ED precautions. He was instructed to stop Entresto  and spironolactone  and decrease Toprol  to 25 mg daily. PET/CT ordered. He was scheduled for close follow-up.  Patient was seen in follow-up the following week and was doing well with no further episodes of hypotension/bradycardia.  Home BP readings consistently >/= 120/70 and HR 60 to 70s.  He was restarted on low-dose Entresto .   Patient was last seen in the office by me on 11/02/2023 for routine follow-up.  He was doing well at that time.  EKG demonstrated A-fib 65 bpm.  No medication changes were made.  Patient was evaluated by Dr. Marven Slimmer on 11/05/2023 he reported fatigue when in A-fib.  Plan for cardiac MRI to determine if patient needs ICD placement and then will proceed with plan for rhythm control.  He underwent cardiac MRI on 12/31/2023 which demonstrated findings consistent with nonischemic cardiomyopathy.  History of Present Illness    Today, patient ***  PAF Onset March 2017.  DCCV April 2017, June 2021.  DCCV 07/30/2023.  Patient denies cardiac awareness of arrhythmia. He is tolerating work and home activities  without dyspnea or fatigue. Denies spontaneous bleeding concerns. Irregular rhythm, regular rate on exam.  EKG*** -Continue Toprol , Eliquis . -Will plan to see Dr. Marven Slimmer on 02/02/2024.   Nonobstructive CAD [facility] March 2017 demonstrated ostial to proximal LAD 40%.  PET stress 10/21/2023 without evidence of ischemia or infarct, high risk secondary to severely reduced LVEF.  Patient denies chest pain, pressure or tightness.*** -Continue Toprol .  Not on aspirin  secondary to Eliquis .   Chronic systolic heart failure/nonischemic cardiomyopathy Echo September 2024 showed EF 30 to 35%, global hypokinesis, mildly reduced RV function, normal PA pressure, moderate LAE, mild to moderate MR, mild AI, mild dilatation of ascending aorta 40 mm.  Cardiac MRI March 2025 demonstrated findings most consistent with nonischemic cardiomyopathy.  Patient*** Euvolemic and well compensated on exam.  -Contact office for hypotension.*** -Continue daily weight. Contact office for weight gain or edema.*** -Continue Toprol , Jardiance , Entresto . GDMT limited secondary to soft BP (***today).  ROS: All other systems reviewed and are otherwise negative except as noted in History of Present Illness.  EKGs/Labs Reviewed        06/01/2023: ALT 24; AST 22 08/19/2023: BUN 23; Creatinine, Ser 1.09; Potassium 4.7; Sodium 140   08/19/2023: Hemoglobin 15.1; WBC 6.9   06/01/2023: TSH 2.630   No results found for requested labs within last 365 days.  ***  Risk Assessment/Calculations    {Does this patient have ATRIAL FIBRILLATION?:[id number]} No BP recorded.  {Refresh Note OR Click here to enter BP  :1}***        Physical Exam    VS:  There were no vitals taken for this visit. , BMI There is no height or weight on file to calculate BMI.  GEN: Well nourished, well developed, in no acute distress. Neck: No JVD or carotid bruits. Cardiac: *** RRR. No murmurs. No rubs or gallops.   Respiratory:  Respirations regular and  unlabored. Clear to auscultation without rales, wheezing or rhonchi. GI: Soft, nontender, nondistended. Extremities: Radials/DP/PT 2+ and equal bilaterally. No clubbing or cyanosis. No edema ***  Skin: Warm and dry, no rash. Neuro: Strength intact.  Assessment & Plan   ***  Disposition: ***     {Are you ordering a CV Procedure (e.g. stress test, cath, DCCV, TEE, etc)?   Press F2        :[id number]}   Signed, Lonell Rives. Jiyah Torpey, DNP, NP-C

## 2024-01-31 ENCOUNTER — Ambulatory Visit: Payer: BC Managed Care – PPO | Admitting: Student

## 2024-02-01 NOTE — Progress Notes (Unsigned)
 Electrophysiology Office Follow up Visit Note:    Date:  02/01/2024   ID:  [name], DOB 04/28/1961, MRN [id number]  PCP:  Patient, No Pcp Per  [facility] Cardiologist:  Antionette Kirks, MD  [facility] Electrophysiologist:  Boyce Byes, MD    Interval History:     Walter Banks is a 63 y.o. male who presents for a follow up visit.   I last saw the patient in November 05, 2023 for persistent atrial fibrillation and chronic systolic heart failure.  We plan for cardiac MRI to assess for LGE and need for defibrillator.  He presents today to follow-up the results of the MRI.        Past medical, surgical, social and family history were reviewed.  ROS:   Please see the history of present illness.    All other systems reviewed and are negative.  EKGs/Labs/Other Studies Reviewed:    The following studies were reviewed today:  December 31, 2023 cardiac MRI EF 32 Nonischemic cardiomyopathy pattern Mild MR  November 02, 2023 EKG shows atrial fibrillation.  QTc 407 ms.  QRS duration 104 ms.  October 25th 2024 EKG shows sinus rhythm.  QTc 436 ms       Physical Exam:    VS:  There were no vitals taken for this visit.    Wt Readings from Last 3 Encounters:  11/05/23 242 lb 9.6 oz (110 kg)  11/02/23 244 lb 6.4 oz (110.9 kg)  08/27/23 236 lb 12.8 oz (107.4 kg)     GEN: no distress CARD: Irregularly irregular, No MRG RESP: No IWOB. CTAB.      ASSESSMENT:    No diagnosis found. PLAN:    In order of problems listed above:  #Persistent atrial fibrillation Rhythm control indicated given his reduced LV function.  I do think he is at high risk of developing significant bradycardia given his resting slow heart rate. Plan to start Tikosyn after his defibrillator is implanted.    Risks/benefits of Tikosyn were discussed in detail with the patient during today's clinic appointment and he wishes to proceed.  #Chronic systolic heart failure NYHA class  II-III.  Warm and dry on exam.  EF persistently low despite medical therapy.  QRS is narrow.  I discussed the role of ICD during today's visit.  I discussed the ICD implant procedure in detail the patient including the risks and recovery and he wishes to proceed.  Plan for a [employer] dual-chamber ICD.  The patient has a non-ischemic CM (EF 32%), NYHA Class III CHF, and CAD.  He is referred by Dr Gollan for risk stratification of sudden death and consideration of ICD implantation.  At this time, he meets criteria for ICD implantation for primary prevention of sudden death.  I have had a thorough discussion with the patient reviewing options.  The patient and their family (if available) have had opportunities to ask questions and have them answered. The patient and I have decided together through a shared decision making process to proceed with ICD implant at this time.    Risks, benefits, alternatives to ICD implantation were discussed in detail with the patient today. The patient understands that the risks include but are not limited to bleeding, infection, pneumothorax, perforation, tamponade, vascular damage, renal failure, MI, stroke, death, inappropriate shocks, and lead dislodgement and wishes to proceed.  We will therefore schedule device implantation at the next available time.  He will need to hold his Eliquis  for 3 days  prior to the defibrillator implant.  Plan for Tikosyn loading 4 weeks after he restarts his anticoagulation following ICD implant.  Signed, Harvie Liner, MD, [facility], [facility] 02/01/2024 8:21 PM    Electrophysiology [facility]

## 2024-02-02 ENCOUNTER — Ambulatory Visit: Attending: Cardiology | Admitting: Cardiology

## 2024-02-02 ENCOUNTER — Other Ambulatory Visit: Payer: Self-pay

## 2024-02-02 ENCOUNTER — Encounter: Payer: Self-pay | Admitting: Cardiology

## 2024-02-02 VITALS — BP 118/70 | HR 64 | Ht 74.0 in | Wt 241.0 lb

## 2024-02-02 DIAGNOSIS — I4819 Other persistent atrial fibrillation: Secondary | ICD-10-CM

## 2024-02-02 DIAGNOSIS — I5022 Chronic systolic (congestive) heart failure: Secondary | ICD-10-CM

## 2024-02-02 NOTE — Patient Instructions (Addendum)
 Medication Instructions:  Your physician recommends that you continue on your current medications as directed. Please refer to the Current Medication list given to you today.  *If you need a refill on your cardiac medications before your next appointment, please call your pharmacy*  Lab Work: BMET and CBC  Testing/Procedures: ICD Implant Your physician has recommended that you have a defibrillator inserted. An implantable cardioverter defibrillator (ICD) is a small device that is placed in your chest or, in rare cases, your abdomen. This device uses electrical pulses or shocks to help control life-threatening, irregular heartbeats that could lead the heart to suddenly stop beating (sudden cardiac arrest). Leads are attached to the ICD that goes into your heart. This is done in the hospital and usually requires an overnight stay. Please see the instruction sheet given to you today for more information.  Follow-Up: At [facility], you and your health needs are our priority.  As part of our continuing mission to provide you with exceptional heart care, our providers are all part of one team.  This team includes your primary Cardiologist (physician) and Advanced Practice Providers or APPs (Physician Assistants and Nurse Practitioners) who all work together to provide you with the care you need, when you need it.  Your next appointment:   We will call you to arrange your post-procedure appointments   Tikosyn (Dofetilide) Hospital Admission   Prior to day of admission:  Check with drug insurance company for cost of drug to ensure affordability --- Dofetilide 500 mcg twice a day.  GoodRx is an option if insurance copay is unaffordable.    No Benadryl is allowed 3 days prior to admission.   Please ensure no missed doses of your anticoagulation (blood thinner) for 3 weeks prior to admission. If a dose is missed please notify our office immediately.   A pharmacist will review all your  medications for potential interactions with Tikosyn. If any medication changes are needed prior to admission we will be in touch with you.   If any new medications are started AFTER your admission date is set with Radio producer. Please notify our office immediately so your medication list can be updated and reviewed by our pharmacist again.  On day of admission:  Tikosyn initiation requires a 3 night/4 day hospital stay with constant telemetry monitoring. You will have an EKG after each dose of Tikosyn as well as daily lab draws.   If the drug does not convert you to normal rhythm a cardioversion after the 4th dose of Tikosyn.   Afib Clinic office visit on the morning of admission is needed for preliminary labs/ekg.   Time of admission is dependent on bed availability in the hospital. In some instances, you will be sent home until bed is available. Rarely admission can be delayed to the following day if hospital census prevents available beds.   You may bring personal belongings/clothing with you to the hospital. Please leave your suitcase in the car until you arrive in admissions.   Questions please call our office at [phone]

## 2024-02-07 ENCOUNTER — Telehealth: Payer: Self-pay | Admitting: Pharmacist

## 2024-02-07 NOTE — Telephone Encounter (Signed)
Medication list reviewed in anticipation of upcoming Tikosyn initiation. Patient is not taking any contraindicated or QTc prolonging medications.   Patient is anticoagulated on Eliquis on the appropriate dose. Please ensure that patient has not missed any anticoagulation doses in the 3 weeks prior to Tikosyn initiation.   Patient will need to be counseled to avoid use of Benadryl while on Tikosyn and in the 2-3 days prior to Tikosyn initiation.  

## 2024-02-18 NOTE — Telephone Encounter (Signed)
 Patient is scheduled for an ICD implant with Dr. Marven Slimmer on 6/6.    Tikosyn load about 6 weeks after implant.

## 2024-02-21 ENCOUNTER — Other Ambulatory Visit: Payer: Self-pay

## 2024-02-21 MED ORDER — EMPAGLIFLOZIN 10 MG PO TABS: 10.0000 mg | ORAL_TABLET | Freq: Every day | ORAL | 3 refills | Status: AC

## 2024-02-24 LAB — CBC WITH DIFFERENTIAL/PLATELET
Basophils Absolute: 0 10*3/uL (ref 0.0–0.2)
Basos: 1 %
EOS (ABSOLUTE): 0.1 10*3/uL (ref 0.0–0.4)
Eos: 2 %
Hematocrit: 45.5 % (ref 37.5–51.0)
Hemoglobin: 15.1 g/dL (ref 13.0–17.7)
Immature Grans (Abs): 0 10*3/uL (ref 0.0–0.1)
Immature Granulocytes: 0 %
Lymphocytes Absolute: 2.2 10*3/uL (ref 0.7–3.1)
Lymphs: 34 %
MCH: 32.1 pg (ref 26.6–33.0)
MCHC: 33.2 g/dL (ref 31.5–35.7)
MCV: 97 fL (ref 79–97)
Monocytes Absolute: 0.7 10*3/uL (ref 0.1–0.9)
Monocytes: 11 %
Neutrophils Absolute: 3.3 10*3/uL (ref 1.4–7.0)
Neutrophils: 52 %
Platelets: 223 10*3/uL (ref 150–450)
RBC: 4.7 x10E6/uL (ref 4.14–5.80)
RDW: 13.6 % (ref 11.6–15.4)
WBC: 6.3 10*3/uL (ref 3.4–10.8)

## 2024-02-24 LAB — BASIC METABOLIC PANEL WITH GFR
BUN/Creatinine Ratio: 21 (ref 10–24)
BUN: 22 mg/dL (ref 8–27)
CO2: 24 mmol/L (ref 20–29)
Calcium: 9.1 mg/dL (ref 8.6–10.2)
Chloride: 102 mmol/L (ref 96–106)
Creatinine, Ser: 1.04 mg/dL (ref 0.76–1.27)
Glucose: 96 mg/dL (ref 70–99)
Potassium: 4.7 mmol/L (ref 3.5–5.2)
Sodium: 139 mmol/L (ref 134–144)
eGFR: 81 mL/min/{1.73_m2} (ref >59)

## 2024-03-06 ENCOUNTER — Telehealth (HOSPITAL_COMMUNITY): Payer: Self-pay

## 2024-03-06 NOTE — Telephone Encounter (Signed)
 Attempted to reach patient to discuss upcoming procedure, no answer. Left VM for patient to return call.

## 2024-03-06 NOTE — Telephone Encounter (Addendum)
 Patient returned call to discuss upcoming procedure.   Confirmed patient is scheduled for a ICD implant on Friday, June 6 with Dr. Harvie Liner. Instructed patient to arrive at the Main Entrance A at [facility]: [address] [place], [place] [address] and check in at Admitting at 12:30 PM.   Labs completed  Any recent signs of acute illness or been started on antibiotics?  No Any new medications started? No Any medications to hold? Hold Jardiance  and Eliquis  for 3 days prior to procedure- last dose today, June 2.  Medication instructions:  On the morning of your procedure you may take all other medications not discussed with a sip of water. No eating or drinking after midnight prior to procedure.   The night before your procedure and the morning of your procedure, wash thoroughly with the CHG surgical soap from the neck down, paying special attention to the area where your procedure will be performed.  Advised of plan to go home the same day and will only stay overnight if medically necessary. You MUST have a responsible adult to drive you home and MUST be with you the first 24 hours after you arrive home.  Patient verbalized understanding to all instructions provided and agreed to proceed with procedure.

## 2024-03-08 ENCOUNTER — Encounter: Payer: Self-pay | Admitting: Emergency Medicine

## 2024-03-09 NOTE — Pre-Procedure Instructions (Signed)
 Attempted to call patient regarding procedure instructions.  Left voice mail regarding  the following items: Arrival time 1230 Nothing to eat or drink after midnight No meds AM of procedure Responsible person to drive you home and stay with you for 24 hrs Wash with special soap night before and morning of procedure If on anti-coagulant drug instructions Eliquis - should have bee n holding last 3 days.

## 2024-03-10 ENCOUNTER — Ambulatory Visit (HOSPITAL_COMMUNITY)
Admission: RE | Admit: 2024-03-10 | Discharge: 2024-03-11 | Disposition: A | Discharge status: Home / Self Care | Attending: Cardiology | Admitting: Cardiology

## 2024-03-10 ENCOUNTER — Ambulatory Visit (HOSPITAL_COMMUNITY)
Admission: RE | Disposition: A | Discharge status: Home / Self Care | Payer: Self-pay | Source: Home / Self Care | Attending: Cardiology

## 2024-03-10 ENCOUNTER — Other Ambulatory Visit: Payer: Self-pay

## 2024-03-10 DIAGNOSIS — I4891 Unspecified atrial fibrillation: Secondary | ICD-10-CM

## 2024-03-10 DIAGNOSIS — I4819 Other persistent atrial fibrillation: Secondary | ICD-10-CM | POA: Diagnosis not present

## 2024-03-10 DIAGNOSIS — I428 Other cardiomyopathies: Secondary | ICD-10-CM | POA: Diagnosis not present

## 2024-03-10 DIAGNOSIS — Z79899 Other long term (current) drug therapy: Secondary | ICD-10-CM | POA: Diagnosis not present

## 2024-03-10 DIAGNOSIS — Z7901 Long term (current) use of anticoagulants: Secondary | ICD-10-CM | POA: Insufficient documentation

## 2024-03-10 DIAGNOSIS — I2510 Atherosclerotic heart disease of native coronary artery without angina pectoris: Secondary | ICD-10-CM | POA: Insufficient documentation

## 2024-03-10 DIAGNOSIS — Z95810 Presence of automatic (implantable) cardiac defibrillator: Secondary | ICD-10-CM | POA: Diagnosis present

## 2024-03-10 DIAGNOSIS — I5022 Chronic systolic (congestive) heart failure: Secondary | ICD-10-CM

## 2024-03-10 HISTORY — PX: ICD IMPLANT: EP1208

## 2024-03-10 MED ORDER — CEFAZOLIN SODIUM-DEXTROSE 2-4 GM/100ML-% IV SOLN
INTRAVENOUS | Status: AC
  Filled 2024-03-10: qty 100

## 2024-03-10 MED ORDER — FENTANYL CITRATE (PF) 100 MCG/2ML IJ SOLN
INTRAMUSCULAR | Status: AC
  Filled 2024-03-10: qty 2

## 2024-03-10 MED ORDER — MIDAZOLAM HCL 2 MG/2ML IJ SOLN
INTRAMUSCULAR | Status: AC
  Filled 2024-03-10: qty 2

## 2024-03-10 MED ORDER — FENTANYL CITRATE (PF) 100 MCG/2ML IJ SOLN
INTRAMUSCULAR | Status: DC | PRN
  Administered 2024-03-10 (×2): 25 ug via INTRAVENOUS

## 2024-03-10 MED ORDER — SODIUM CHLORIDE 0.9 % IV SOLN: INTRAVENOUS | Status: DC

## 2024-03-10 MED ORDER — ACETAMINOPHEN 325 MG PO TABS: 325.0000 mg | ORAL_TABLET | ORAL | Status: DC | PRN

## 2024-03-10 MED ORDER — LIDOCAINE HCL (PF) 1 % IJ SOLN
INTRAMUSCULAR | Status: DC | PRN
  Administered 2024-03-10: 60 mL

## 2024-03-10 MED ORDER — CEFAZOLIN SODIUM-DEXTROSE 2-4 GM/100ML-% IV SOLN
2.0000 g | INTRAVENOUS | Status: AC
  Administered 2024-03-10: 2 g via INTRAVENOUS

## 2024-03-10 MED ORDER — LIDOCAINE HCL (PF) 1 % IJ SOLN
INTRAMUSCULAR | Status: AC
Start: 2024-03-10 — End: ?
  Filled 2024-03-10: qty 30

## 2024-03-10 MED ORDER — MIDAZOLAM HCL 2 MG/2ML IJ SOLN
INTRAMUSCULAR | Status: AC
Start: 2024-03-10 — End: ?
  Filled 2024-03-10: qty 2

## 2024-03-10 MED ORDER — CHLORHEXIDINE GLUCONATE 4 % EX SOLN: 4.0000 | Freq: Once | CUTANEOUS | Status: DC

## 2024-03-10 MED ORDER — GENTAMICIN SULFATE 40 MG/ML IJ SOLN
INTRAMUSCULAR | Status: AC
  Filled 2024-03-10: qty 2

## 2024-03-10 MED ORDER — SODIUM CHLORIDE 0.9 % IV SOLN
80.0000 mg | INTRAVENOUS | Status: AC
  Administered 2024-03-10: 80 mg

## 2024-03-10 MED ORDER — MIDAZOLAM HCL 5 MG/5ML IJ SOLN
INTRAMUSCULAR | Status: DC | PRN
  Administered 2024-03-10 (×2): 1 mg via INTRAVENOUS

## 2024-03-10 MED ORDER — POVIDONE-IODINE 10 % EX SWAB
2.0000 | Freq: Once | CUTANEOUS | Status: AC
  Administered 2024-03-10: 2 via TOPICAL

## 2024-03-10 MED ORDER — LIDOCAINE HCL (PF) 1 % IJ SOLN
INTRAMUSCULAR | Status: AC
  Filled 2024-03-10: qty 30

## 2024-03-10 MED ORDER — HEPARIN (PORCINE) IN NACL 1000-0.9 UT/500ML-% IV SOLN
INTRAVENOUS | Status: DC | PRN
  Administered 2024-03-10: 500 mL

## 2024-03-10 MED ORDER — SODIUM CHLORIDE 0.9 % IV SOLN
INTRAVENOUS | Status: DC | PRN
  Administered 2024-03-10: 500 mL via INTRAVENOUS

## 2024-03-10 NOTE — Progress Notes (Signed)
 C/O feeling faint; diaphoretic, and color pale after one IV started; given cold rag for forehead; placed in trendelenburg; states feels better

## 2024-03-10 NOTE — Progress Notes (Signed)
 EP BRIEF NOTE  Patient with hypotension during the last portion of ICD implant today. Hypotension accompanied by diaphoresis. Fluoroscopic assessment of the pericardium showed good movement of the heart borders. Electrical parameters of all leads were stable.  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 BP improved with IV NS. In recovery patient's BP slowly trended back to the 90s while I was still at the bedside. He is without complaint (no pain, lightheadedness, dizziness, N/V).  I suspect his hypotension is vagally mediated.  Plan to observe overnight. I did discuss him with our evening team.   Donelda Fujita T. Marven Slimmer, MD, [facility], [facility] Cardiac Electrophysiology

## 2024-03-10 NOTE — H&P (Signed)
 Electrophysiology Office Follow up Visit Note:     Date:  03/10/2024    ID:  Walter Banks, DOB August 03, 1961, MRN [id number]   PCP:  Patient, No Pcp Per      [facility] Cardiologist:  Antionette Kirks, MD  [facility] Electrophysiologist:  Boyce Byes, MD      Interval History:       Walter Banks is a 63 y.o. male who presents for a follow up visit.    I last saw the patient in November 05, 2023 for persistent atrial fibrillation and chronic systolic heart failure.  We plan for cardiac MRI to assess for LGE and need for defibrillator.  He presents today to follow-up the results of the MRI.  Presents for ICD implant. Procedure reviewed.     Objective Past medical, surgical, social and family history were reviewed.   ROS:   Please see the history of present illness.    All other systems reviewed and are negative.   EKGs/Labs/Other Studies Reviewed:     The following studies were reviewed today:   December 31, 2023 cardiac MRI EF 32 Nonischemic cardiomyopathy pattern Mild MR   November 02, 2023 EKG shows atrial fibrillation.  QTc 407 ms.  QRS duration 104 ms.   October 25th 2024 EKG shows sinus rhythm.  QTc 436 ms         Physical Exam:     VS:  AF 116/82, 79, 20      Wt Readings from Last 3 Encounters:  11/05/23 242 lb 9.6 oz (110 kg)  11/02/23 244 lb 6.4 oz (110.9 kg)  08/27/23 236 lb 12.8 oz (107.4 kg)      GEN: no distress CARD: Irregularly irregular, No MRG RESP: No IWOB. CTAB.     Assessment ASSESSMENT:     No diagnosis found. PLAN:     In order of problems listed above:   #Persistent atrial fibrillation Rhythm control indicated given his reduced LV function.  I do think he is at high risk of developing significant bradycardia given his resting slow heart rate.   Plan to start Tikosyn 6 weeks after ICD implant.   #Chronic systolic heart failure NYHA class II-III.  Warm and dry on exam.  EF persistently low despite medical therapy.   QRS is narrow.  I discussed the role of ICD during today's visit.  I discussed the ICD implant procedure in detail the patient including the risks and recovery and he wishes to proceed.   Plan for a [employer] dual-chamber ICD.   The patient has a non-ischemic CM (EF 32%), NYHA Class III CHF, and CAD.  He is referred by Dr Gollan for risk stratification of sudden death and consideration of ICD implantation.  At this time, he meets criteria for ICD implantation for primary prevention of sudden death.  I have had a thorough discussion with the patient reviewing options.  The patient and their family (if available) have had opportunities to ask questions and have them answered. The patient and I have decided together through a shared decision making process to proceed with ICD implant at this time.     Risks, benefits, alternatives to ICD implantation were discussed in detail with the patient today. The patient understands that the risks include but are not limited to bleeding, infection, pneumothorax, perforation, tamponade, vascular damage, renal failure, MI, stroke, death, inappropriate shocks, and lead dislodgement and wishes to proceed.  We will therefore schedule device implantation at the next available  time.   He will need to hold his Eliquis  for 3 days prior to the defibrillator implant.   Plan for Tikosyn loading 6 weeks after he restarts his anticoagulation following ICD implant.   Presents for ICD implant. Procedure reviewed.  Signed, Harvie Liner, MD, [facility], [facility] 03/10/2024 Electrophysiology [facility]

## 2024-03-11 ENCOUNTER — Ambulatory Visit (HOSPITAL_COMMUNITY)

## 2024-03-11 DIAGNOSIS — I5022 Chronic systolic (congestive) heart failure: Secondary | ICD-10-CM | POA: Diagnosis not present

## 2024-03-11 DIAGNOSIS — Z95810 Presence of automatic (implantable) cardiac defibrillator: Secondary | ICD-10-CM

## 2024-03-11 DIAGNOSIS — I4819 Other persistent atrial fibrillation: Secondary | ICD-10-CM | POA: Diagnosis not present

## 2024-03-11 DIAGNOSIS — I2510 Atherosclerotic heart disease of native coronary artery without angina pectoris: Secondary | ICD-10-CM | POA: Diagnosis not present

## 2024-03-11 DIAGNOSIS — I428 Other cardiomyopathies: Secondary | ICD-10-CM | POA: Diagnosis not present

## 2024-03-11 MED ORDER — SACUBITRIL-VALSARTAN 24-26 MG PO TABS
1.0000 | ORAL_TABLET | Freq: Two times a day (BID) | ORAL | Status: DC
  Administered 2024-03-11: 1 via ORAL
  Filled 2024-03-11: qty 1

## 2024-03-11 MED ORDER — ONDANSETRON HCL 4 MG/2ML IJ SOLN: 4.0000 mg | Freq: Four times a day (QID) | INTRAMUSCULAR | Status: DC | PRN

## 2024-03-11 NOTE — Discharge Instructions (Addendum)
 Hold Eliquis  for 5 days after the ICD implantation. OK to restart Eliquis  03/16/2024    After Your ICD (Implantable Cardiac Defibrillator)      You have a [employer] ICD  Do not lift your arm above shoulder height for 1 week after your procedure. After 7 days, you may progress as below.     Saturday March 18, 2024  Sunday March 19, 2024 Monday March 20, 2024 Tuesday March 21, 2024   Do not lift, push, pull, or carry anything over 10 pounds with the affected arm until 6 weeks (04/22/24) after your procedure.   Monitor your defibrillator site for redness, swelling, and drainage. Call the device clinic at [phone] if you experience these symptoms or fever/chills.  If your incision is closed with Steri-strips or staples:  You may shower 7 days after your procedure and wash your incision with soap and water. Avoid lotions, ointments, or perfumes over your incision until it is well-healed.  You may use a hot tub or a pool AFTER your wound check appointment if the incision is completely closed.  Your ICD  is MRI compatible.  Your ICD is designed to protect you from life threatening heart rhythms. Because of this, you may receive a shock.   1 shock with no symptoms:  Call the office during business hours. 1 shock with symptoms (chest pain, chest pressure, dizziness, lightheadedness, shortness of breath, overall feeling unwell):  Call 911. If you experience 2 or more shocks in 24 hours:  Call 911. If you receive a shock, you should not drive for 6 months per the [place] DMV IF you receive appropriate therapy from your ICD.   ICD Alerts:  Some alerts are vibratory and others beep. These are NOT emergencies. Please call our office to let us  know. If this occurs at night or on weekends, it can wait until the next business day. Send a remote transmission.  If your device is capable of reading fluid status (for heart failure), you will be offered monthly monitoring to review this with you.    Remote monitoring is used to monitor your ICD from home. This monitoring is scheduled every 91 days by our office. It allows us  to keep an eye on the functioning of your device to ensure it is working properly. You will routinely see your Electrophysiologist annually (more often if necessary).

## 2024-03-11 NOTE — Progress Notes (Signed)
   Rounding Note    Patient Name: Walter Banks Date of Encounter: 03/11/2024  [facility] Cardiologist: Antionette Kirks, MD   Subjective   NAEO. Feels well this AM. Friend at bedside.  Vital Signs    Vitals:   03/10/24 1945 03/10/24 2000 03/10/24 2050 03/11/24 0500  BP: (!) 111/93 111/78 103/69 119/81  Pulse: 64 65 61 66  Resp: 12 20 17 19   Temp:    97.6 F (36.4 C)  TempSrc:      SpO2: 99% 100% 100% 97%  Weight:      Height:        Intake/Output Summary (Last 24 hours) at 03/11/2024 0948 Last data filed at 03/11/2024 8119 Gross per 24 hour  Intake 100 ml  Output --  Net 100 ml      03/10/2024   12:56 PM 02/02/2024    8:39 AM 11/05/2023    8:46 AM  Last 3 Weights  Weight (lbs) 240 lb 241 lb 242 lb 9.6 oz  Weight (kg) 108.863 kg 109.317 kg 110.043 kg      Telemetry    Personally Reviewed  ECG    Personally Reviewed  Physical Exam   GEN: No acute distress.   Cardiac: irregularly irregular, no murmurs, rubs, or gallops. L prepectoral pocket healing well. Respiratory: Clear to auscultation bilaterally. Psych: Normal affect   Assessment & Plan    #ICD in situ Doing well after ICD Implant. CXR still pending Device check shows stable parameters OK to restart OAC 03/16/2024  Anticipate discharge later this AM.  Donelda Fujita T. Marven Slimmer, MD, [facility], [facility] Cardiac Electrophysiology

## 2024-03-11 NOTE — Discharge Summary (Signed)
 Discharge Summary   Patient ID: Walter Banks MRN: [id number]; DOB: Jan 22, 1961  Admit date: 03/10/2024 Discharge date: 03/11/2024  PCP:  Patient, No Pcp Per   [facility] Providers Cardiologist:  Antionette Kirks, MD  Electrophysiologist:  Boyce Byes, MD      Discharge Diagnoses  Principal Problem:   Cardiac defibrillator in situ   Diagnostic Studies/Procedures   ICD implantation  CONCLUSIONS:   1. Chronic systolic heart failure  2. Successful ICD implantation with a [employer] ICD implanted for prevention of sudden death.   3.  No early apparent complications.   4. Hold anticoagulation for 5 days  (OK to restart 03/16/2024; risks of OAC hold have been discussed with the patient)  Right atrial lead (model Ingevity+, serial [id number])  RV single coil ICD lead was advanced to the RV mid septum (model Reliance 4-Front, serial [id number])  Pulse generator (model Vigilant EL ICD DR, serial [id number])  _____________   History of Present Illness   Walter Banks is a 63 y.o. male with with chronic systolic CHF and persistent afib. I last saw the patient in November 05, 2023 for persistent atrial fibrillation and chronic systolic heart failure.  We plan for cardiac MRI to assess for LGE and need for defibrillator.  He presents today to follow-up the results of the MRI.   #Persistent atrial fibrillation Rhythm control indicated given his reduced LV function.  I do think he is at high risk of developing significant bradycardia given his resting slow heart rate.   Plan to start Tikosyn 6 weeks after ICD implant.   #Chronic systolic heart failure NYHA class II-III.  Warm and dry on exam.  EF persistently low despite medical therapy.  QRS is narrow.  I discussed the role of ICD during today's visit.  I discussed the ICD implant procedure in detail the patient including the risks and recovery and he wishes to proceed.   Plan for a [employer] dual-chamber  ICD.  Presents for ICD implant. Procedure reviewed.   Hospital Course   Consultants: N/A   Patient presented for the planned procedure and underwent implantation of [employer] dual-chamber ICD for primary prevention of sudden cardiac death.  His anticoagulation therapy was held with instruction to continue to hold the blood thinner for 5 more days.  Plan to restart anticoagulation therapy on 03/16/2024.  He did have hypotension during the last portion of the ICD implantation, this was felt to be vagally mediated.  He recovered well without any issue.  Postprocedure, there was no early complications.  He was kept overnight for observation.  Chest x-ray obtained in the morning of 03/11/2024 showed no sign of pneumothorax.  Patient was seen by Dr. Marven Banks at which time he was doing well and that he is deemed stable for discharge from the cardiac perspective.  Device check showed stable parameters.       Did the patient have an acute coronary syndrome (MI, NSTEMI, STEMI, etc) this admission?:  No                               Did the patient have a percutaneous coronary intervention (stent / angioplasty)?:  No.          _____________  Discharge Vitals Blood pressure 119/81, pulse 66, temperature 97.6 F (36.4 C), resp. rate 19, height 6\' 2"  (1.88 m), weight 108.9 kg, SpO2 97%.  Filed Weights   03/10/24 1256  Weight: 108.9 kg    Labs & Radiologic Studies  CBC No results for input(s): "WBC", "NEUTROABS", "HGB", "HCT", "MCV", "PLT" in the last 72 hours. Basic Metabolic Panel No results for input(s): "NA", "K", "CL", "CO2", "GLUCOSE", "BUN", "CREATININE", "CALCIUM ", "MG", "PHOS" in the last 72 hours. Liver Function Tests No results for input(s): "AST", "ALT", "ALKPHOS", "BILITOT", "PROT", "ALBUMIN" in the last 72 hours. No results for input(s): "LIPASE", "AMYLASE" in the last 72 hours. High Sensitivity Troponin:   No results for input(s): "TROPONINIHS" in the last 720 hours.  No  results for input(s): "TRNPT" in the last 720 hours.  BNP Invalid input(s): "POCBNP" No results for input(s): "PROBNP" in the last 72 hours.  No results for input(s): "BNP" in the last 72 hours.  D-Dimer No results for input(s): "DDIMER" in the last 72 hours. Hemoglobin A1C No results for input(s): "HGBA1C" in the last 72 hours. Fasting Lipid Panel No results for input(s): "CHOL", "HDL", "LDLCALC", "TRIG", "CHOLHDL", "LDLDIRECT" in the last 72 hours. No results found for: "LIPOA"  Thyroid  Function Tests No results for input(s): "TSH", "T4TOTAL", "T3FREE", "THYROIDAB" in the last 72 hours.  Invalid input(s): "FREET3" _____________  DG Chest 2 View Result Date: 03/11/2024 CLINICAL DATA:  [id number] ICD (implantable cardioverter-defibrillator) in place [id number]. EXAM: CHEST - 2 VIEW COMPARISON:  07/22/2022. FINDINGS: Bilateral lung fields are clear. Bilateral costophrenic angles are clear. Normal cardio-mediastinal silhouette. Interval placement of left-sided dual lead cardiac pacemaker/defibrillator. No pneumothorax. No acute osseous abnormalities. The soft tissues are within normal limits. IMPRESSION: No active cardiopulmonary disease. Interval placement of left-sided dual lead cardiac pacemaker/defibrillator. No pneumothorax. Electronically Signed   By: Beula Brunswick M.D.   On: 03/11/2024 10:12   EP PPM/ICD IMPLANT Result Date: 03/10/2024  CONCLUSIONS:  1. Chronic systolic heart failure  2. Successful ICD implantation with a [employer] ICD implanted for prevention of sudden death.  3.  No early apparent complications.  4. Hold anticoagulation for 5 days  (OK to restart 03/16/2024; risks of OAC hold have been discussed with the patient)    Disposition Pt is being discharged home today in good condition.  Follow-up Plans & Appointments  Follow-up Information     [facility] at [facility] Dept of [facility] [employer] Follow up on 03/23/2024.   Specialty: Cardiology Why: 10:40AM.  Wound check Contact information: [address] [place]  [address] [phone]        Riddle, Suzann, NP Follow up on 06/19/2024.   Specialty: Cardiology Why: 1:00PM. Cardiology follow up Contact information: [address] [place] [address] [phone]                Discharge Instructions     Diet - low sodium heart healthy   Complete by: As directed    Increase activity slowly   Complete by: As directed        Discharge Medications Allergies as of 03/11/2024   No Known Allergies      Medication List     TAKE these medications    apixaban  5 MG Tabs tablet Commonly known as: ELIQUIS  Take 1 tablet (5 mg total) by mouth 2 (two) times daily.   empagliflozin  10 MG Tabs tablet Commonly known as: Jardiance  Take 1 tablet (10 mg total) by mouth daily before breakfast.   Entresto  24-26 MG Generic drug: sacubitril -valsartan  Take 1 tablet by mouth 2 (two) times daily.   metoprolol  succinate 50 MG 24 hr tablet Commonly known as: TOPROL -XL  Take 25 mg by mouth daily.   multivitamin Tabs tablet Take 1 tablet by mouth daily.         Outstanding Labs/Studies N/A  Duration of Discharge Encounter: APP Time: 15 minutes   Signed, Zakyia Gagan, PA 03/11/2024, 11:41 AM

## 2024-03-11 NOTE — Plan of Care (Signed)
 Pt now has ICD in place for arrhythmias

## 2024-03-13 ENCOUNTER — Telehealth: Payer: Self-pay

## 2024-03-13 ENCOUNTER — Encounter (HOSPITAL_COMMUNITY): Payer: Self-pay | Admitting: Cardiology

## 2024-03-13 NOTE — Telephone Encounter (Signed)
 Alert remote transmission: NSVT events and Atrial Burden of at least 6.0 hours in a 24 hour period. Presents in AF on the 6/9 remote. AF started on 6/8. 182 ATR.  On OAC per EMR. Known persistent AF.   Histograms show poor ventricular control.  Since 6/7, 116 NSVT events, longest x 23 seconds, V-rates 160-190s.  Irregular R-R. (Meet criteria for AF with RVR events). See examples below.   Patient just discharged home on 6/7 following ICD implant.  Plans to initiate Tikosyn 6 weeks post implant.  LM for patient to see how he is feeling and ensure he is taking his Metoprolol . Also, forwarding to Dr. Marven Slimmer for review and recommendations.

## 2024-03-14 MED FILL — Midazolam HCl Inj 2 MG/2ML (Base Equivalent): INTRAMUSCULAR | Qty: 1 | Status: AC

## 2024-03-14 NOTE — Telephone Encounter (Signed)
 Patient states that he is always in Af and he can tell when his heart rates are up. He feels weak and fatigued but these are ongoing symptoms for some time and not necessarily worse over the weekend.  He is aware of plans to start on Tikosyn in the future.   Overall has been feeling better since he discharged from the hospital.  Says he is taking all of his medications and in particular the Metoprolol .  He has our device clinic number to call if sx's get worse before his next follow up.

## 2024-03-15 ENCOUNTER — Telehealth: Payer: Self-pay | Admitting: Cardiovascular Disease

## 2024-03-15 NOTE — Telephone Encounter (Signed)
 Pt c/o medication issue:  1. Name of Medication:  Jardiance   2. How are you currently taking this medication (dosage and times per day)?   3. Are you having a reaction (difficulty breathing--STAT)?   4. What is your medication issue?   Britta Candy with [employer] says on 6/07 they sent a fax for patient to be switched from Jardiance  to Ste[place] and she would like to confirm it has been received. Please advise.  reference#: [id number]

## 2024-03-16 NOTE — Telephone Encounter (Signed)
 Attempted to contact [place].  Left message to call back

## 2024-03-20 NOTE — Telephone Encounter (Signed)
 Spoke with Maggie from [employer] who stated she will refax forms for review.

## 2024-03-23 ENCOUNTER — Ambulatory Visit

## 2024-03-24 ENCOUNTER — Ambulatory Visit: Attending: Cardiology | Admitting: *Deleted

## 2024-03-24 DIAGNOSIS — I4819 Other persistent atrial fibrillation: Secondary | ICD-10-CM | POA: Diagnosis not present

## 2024-03-24 DIAGNOSIS — I5022 Chronic systolic (congestive) heart failure: Secondary | ICD-10-CM | POA: Diagnosis not present

## 2024-03-24 NOTE — Patient Instructions (Signed)

## 2024-04-06 NOTE — Telephone Encounter (Signed)
Lm to sch

## 2024-04-07 ENCOUNTER — Other Ambulatory Visit: Payer: Self-pay | Admitting: Cardiovascular Disease

## 2024-04-07 DIAGNOSIS — I4891 Unspecified atrial fibrillation: Secondary | ICD-10-CM

## 2024-04-10 NOTE — Progress Notes (Signed)
 Normal dual chamber ICD wound check. Wound well healed. Presenting rhythm: Afib/VS . Routine testing performed. Thresholds, sensing, and impedance consistent with implant measurements with 3.5V safety margin/auto capture until 3 month visit. No treated arrhythmias. Multiple AT/AF/NSVT episodes with poor ventricular rate control noted with ongoing Afib on Eliquis  and Toprol -XL. Patient planned to be started on Tikosyn at 6 week visit per Bethania note in [place]. Reviewed arm restrictions to continue for 6 weeks total post op. Reviewed shock plan.  Pt enrolled in remote follow-up. All questions answered

## 2024-04-10 NOTE — Telephone Encounter (Signed)
 Eliquis  5mg  refill request received. Patient is 63 years old, weight-108.9kg, Crea-1.04 on 02/23/24, Diagnosis-Afib, and last seen by Dr. Cindie on 02/02/24. Dose is appropriate based on dosing criteria. Will send in refill to requested pharmacy.

## 2024-04-18 ENCOUNTER — Encounter (HOSPITAL_COMMUNITY): Payer: Self-pay | Admitting: *Deleted

## 2024-04-20 ENCOUNTER — Telehealth (HOSPITAL_COMMUNITY): Payer: Self-pay

## 2024-04-20 NOTE — Telephone Encounter (Signed)
 Initiated prior authorization to [facility] for Tikosyn admission.  Date of service: 05/09/2024 Faxed clinical information to [phone] Reference # [id number] Authorization is pending for review.

## 2024-04-24 ENCOUNTER — Ambulatory Visit

## 2024-04-24 DIAGNOSIS — I4891 Unspecified atrial fibrillation: Secondary | ICD-10-CM

## 2024-04-24 NOTE — Telephone Encounter (Signed)
 Admission on 05/09/24 approved ref  [id number].

## 2024-04-25 LAB — CUP PACEART REMOTE DEVICE CHECK
Battery Remaining Longevity: 96 mo
Battery Remaining Percentage: 85 %
Brady Statistic RA Percent Paced: 0 %
Brady Statistic RV Percent Paced: 0 %
Date Time Interrogation Session: 20250721043500
HighPow Impedance: 71 Ohm
Implantable Lead Connection Status: 753985
Implantable Lead Connection Status: 753985
Implantable Lead Implant Date: 20250606
Implantable Lead Implant Date: 20250606
Implantable Lead Location: 753859
Implantable Lead Location: 753860
Implantable Lead Model: 673
Implantable Lead Model: 7841
Implantable Lead Serial Number: 1579360
Implantable Lead Serial Number: 2599026
Implantable Pulse Generator Implant Date: 20250606
Lead Channel Impedance Value: 452 Ohm
Lead Channel Impedance Value: 633 Ohm
Lead Channel Setting Pacing Amplitude: 3.5 V
Lead Channel Setting Pacing Amplitude: 3.5 V
Lead Channel Setting Pacing Pulse Width: 0.4 ms
Lead Channel Setting Sensing Sensitivity: 0.5 mV
Pulse Gen Serial Number: 695542
Zone Setting Status: 755011

## 2024-04-27 ENCOUNTER — Ambulatory Visit: Payer: Self-pay | Admitting: Cardiology

## 2024-05-05 ENCOUNTER — Encounter (HOSPITAL_COMMUNITY): Payer: Self-pay

## 2024-05-09 ENCOUNTER — Telehealth (HOSPITAL_COMMUNITY): Payer: Self-pay | Admitting: Pharmacy Technician

## 2024-05-09 ENCOUNTER — Ambulatory Visit (HOSPITAL_COMMUNITY)
Admission: RE | Admit: 2024-05-09 | Discharge: 2024-05-09 | Disposition: A | Discharge status: Home / Self Care | Source: Ambulatory Visit | Attending: Internal Medicine | Admitting: Internal Medicine

## 2024-05-09 ENCOUNTER — Inpatient Hospital Stay (HOSPITAL_COMMUNITY)
Admission: AD | Admit: 2024-05-09 | Discharge: 2024-05-12 | DRG: 309 | Disposition: A | Discharge status: Home / Self Care | Source: Ambulatory Visit | Attending: Cardiology | Admitting: Cardiology

## 2024-05-09 ENCOUNTER — Ambulatory Visit (HOSPITAL_COMMUNITY): Payer: Self-pay | Admitting: Internal Medicine

## 2024-05-09 ENCOUNTER — Encounter (HOSPITAL_COMMUNITY): Payer: Self-pay | Admitting: Cardiology

## 2024-05-09 ENCOUNTER — Other Ambulatory Visit (HOSPITAL_COMMUNITY): Payer: Self-pay

## 2024-05-09 ENCOUNTER — Other Ambulatory Visit: Payer: Self-pay

## 2024-05-09 VITALS — BP 104/64 | HR 76 | Ht 74.0 in | Wt 247.2 lb

## 2024-05-09 DIAGNOSIS — I4819 Other persistent atrial fibrillation: Secondary | ICD-10-CM | POA: Diagnosis present

## 2024-05-09 DIAGNOSIS — Z79899 Other long term (current) drug therapy: Secondary | ICD-10-CM | POA: Diagnosis not present

## 2024-05-09 DIAGNOSIS — Z6831 Body mass index (BMI) 31.0-31.9, adult: Secondary | ICD-10-CM | POA: Diagnosis not present

## 2024-05-09 DIAGNOSIS — I110 Hypertensive heart disease with heart failure: Secondary | ICD-10-CM | POA: Diagnosis present

## 2024-05-09 DIAGNOSIS — E66811 Obesity, class 1: Secondary | ICD-10-CM | POA: Diagnosis present

## 2024-05-09 DIAGNOSIS — Z7901 Long term (current) use of anticoagulants: Secondary | ICD-10-CM

## 2024-05-09 DIAGNOSIS — I4891 Unspecified atrial fibrillation: Secondary | ICD-10-CM | POA: Diagnosis not present

## 2024-05-09 DIAGNOSIS — K089 Disorder of teeth and supporting structures, unspecified: Secondary | ICD-10-CM | POA: Diagnosis present

## 2024-05-09 DIAGNOSIS — I5022 Chronic systolic (congestive) heart failure: Secondary | ICD-10-CM | POA: Diagnosis present

## 2024-05-09 DIAGNOSIS — D6869 Other thrombophilia: Secondary | ICD-10-CM | POA: Diagnosis present

## 2024-05-09 DIAGNOSIS — Z95810 Presence of automatic (implantable) cardiac defibrillator: Secondary | ICD-10-CM

## 2024-05-09 LAB — BASIC METABOLIC PANEL WITH GFR
BUN/Creatinine Ratio: 28 — ABNORMAL HIGH (ref 10–24)
BUN: 24 mg/dL (ref 8–27)
CO2: 22 mmol/L (ref 20–29)
Calcium: 8.8 mg/dL (ref 8.6–10.2)
Chloride: 100 mmol/L (ref 96–106)
Creatinine, Ser: 0.86 mg/dL (ref 0.76–1.27)
Glucose: 183 mg/dL — ABNORMAL HIGH (ref 70–99)
Potassium: 4.1 mmol/L (ref 3.5–5.2)
Sodium: 139 mmol/L (ref 134–144)
eGFR: 97 mL/min/1.73 (ref >59)

## 2024-05-09 LAB — MAGNESIUM: Magnesium: 2 mg/dL (ref 1.6–2.3)

## 2024-05-09 LAB — HIV ANTIBODY (ROUTINE TESTING W REFLEX): HIV Screen 4th Generation wRfx: NONREACTIVE

## 2024-05-09 MED ORDER — SACUBITRIL-VALSARTAN 24-26 MG PO TABS
1.0000 | ORAL_TABLET | Freq: Two times a day (BID) | ORAL | Status: DC
  Administered 2024-05-09 – 2024-05-12 (×5): 1 via ORAL
  Filled 2024-05-09 (×5): qty 1

## 2024-05-09 MED ORDER — SODIUM CHLORIDE 0.9% FLUSH
3.0000 mL | Freq: Two times a day (BID) | INTRAVENOUS | Status: DC
  Administered 2024-05-09 – 2024-05-12 (×4): 3 mL via INTRAVENOUS

## 2024-05-09 MED ORDER — SODIUM CHLORIDE 0.9 % IV SOLN
250.0000 mL | INTRAVENOUS | Status: AC | PRN
Start: 2024-05-09 — End: 2024-05-10

## 2024-05-09 MED ORDER — METOPROLOL SUCCINATE ER 25 MG PO TB24
25.0000 mg | ORAL_TABLET | Freq: Every day | ORAL | Status: DC
  Administered 2024-05-10: 25 mg via ORAL
  Filled 2024-05-09: qty 1

## 2024-05-09 MED ORDER — APIXABAN 5 MG PO TABS
5.0000 mg | ORAL_TABLET | Freq: Two times a day (BID) | ORAL | Status: DC
  Administered 2024-05-09 – 2024-05-12 (×6): 5 mg via ORAL
  Filled 2024-05-09 (×6): qty 1

## 2024-05-09 MED ORDER — SODIUM CHLORIDE 0.9% FLUSH: 3.0000 mL | INTRAVENOUS | Status: DC | PRN

## 2024-05-09 MED ORDER — DOFETILIDE 500 MCG PO CAPS
500.0000 ug | ORAL_CAPSULE | Freq: Two times a day (BID) | ORAL | Status: DC
  Administered 2024-05-09 – 2024-05-12 (×6): 500 ug via ORAL
  Filled 2024-05-09 (×6): qty 1

## 2024-05-09 NOTE — Telephone Encounter (Signed)
 Patient Product/process development scientist completed.    The patient is insured through [facility]. Patient has [employer], may use a copay card, and/or apply for patient assistance if available.    Ran test claim for dofetilide  (Tikosyn ) 500 mcg and must fill prescription at [facility] or [employer]   This test claim was processed through [employer]- copay amounts may vary at other pharmacies due to [employer], or as the patient moves through the different stages of their insurance plan.     Reyes Sharps, CPHT Pharmacy Technician III Certified Patient Advocate [facility] Patient Advocate Team Direct Number: [phone]  Fax: [phone]

## 2024-05-09 NOTE — Progress Notes (Signed)
 Pharmacy: Dofetilide  (Tikosyn ) - Initial Consult Assessment and Electrolyte Replacement  Pharmacy consulted to assist in monitoring and replacing electrolytes in this 63 y.o. male admitted on 05/09/2024 undergoing dofetilide  initiation.   Assessment:  Patient Exclusion Criteria: If any screening criteria checked as Yes, then  patient  should NOT receive dofetilide  until criteria item is corrected.  If "Yes" please indicate correction plan.  YES  NO Patient  Exclusion Criteria Correction Plan/Comments   []   [x]   Baseline QTc interval is greater than or equal to 440 msec. IF above YES box checked dofetilide  contraindicated unless patient has ICD; then may proceed if QTc 500-550 msec or with known ventricular conduction abnormalities may proceed with QTc 550-600 msec. QTc = 436 per clinic note    []   [x]   Patient is known or suspected to have a digoxin  level greater than 2 ng/ml: Lab Results  Component Value Date   DIGOXIN  0.6 (L) 12/13/2015       []   [x]   Creatinine clearance less than 20 ml/min (calculated using Cockcroft-Gault, actual body weight and serum creatinine): Estimated Creatinine Clearance: 117.1 mL/min (by C-G formula based on SCr of 0.86 mg/dL).     []   [x]  Patient has received drugs known to prolong the QT intervals within the last 48 hour (examples: phenothiazines, tricyclics or tetracyclic antidepressants, macrolides, 1st generation H-1 antihistamines (especially diphenhydramine), fluoroquinolones, azoles, ondansetron , metoclopramide, promethazine).   Updated information on QT prolonging agents is available to be searched on the following database:QT prolonging agents -If SSRI or antihistamine needed, preferred options are sertraline and loratadine respectively     []   [x]  Patient received a dose of a thiazide diuretic in the last 48 hours [including hydrochlorothiazide (Oretic) alone or in any combination including triamterene (Dyazide, Maxzide)].    []    [x]  Patient received a medication known to increase dofetilide  plasma concentrations prior to initial dofetilide  dose:  Trimethoprim (Primsol, Proloprim) in the last 36 hours Verapamil (Calan, Verelan) in the last 36 hours or a sustained release dose in the last 72 hours Megestrol (Megace) in the last 5 days  Cimetidine (Tagamet) in the last 6 hours Ketoconazole (Nizoral) in the last 24 hours Itraconazole (Sporanox) in the last 48 hours  Prochlorperazine (Compazine) in the last 36 hours     []   [x]   Patient is known to have a history of torsades de pointes; congenital or acquired long QT syndromes.    []   [x]   Patient has received a Class 1 and Class 3 antiarrhythmic with less than 2 half-lives since last dose. (Disopyramide, Quinidine, Procainamide, Lidocaine , Mexiletine, Flecainide, Propafenone, Sotalol, Dronedarone)    []   [x]   Patient has received amiodarone therapy in the past 3 months or amiodarone level is greater than 0.3 ng/ml.    Labs:    Component Value Date/Time   K 4.1 05/09/2024 0854   MG 2.0 05/09/2024 0854     Plan: Select One Calculated CrCl  Dose q12h  [x]  > 60 ml/min 500 mcg  []  40-60 ml/min 250 mcg  []  20-40 ml/min 125 mcg   [x]   Physician selected initial dose within range recommended for patients level of renal function - will monitor for response.  []   Physician selected initial dose outside of range recommended for patients level of renal function - will discuss if the dose should be altered at this time.   Patient has been appropriately anticoagulated with apixaban .  Potassium: K >/= 4: Appropriate to initiate Tikosyn , no replacement needed  Magnesium : Mg 1.8-2: Give Mg 2 gm IV x1 to prevent Mg from dropping below 1.8 - do not need to recheck Mg. Appropriate to initiate Tikosyn    Thank you for allowing pharmacy to participate in this patient's care   Prentice Poisson, PharmD Clinical Pharmacist **Pharmacist phone directory can now be found  on [email or web address] ([email or web address]).  Listed under [facility].

## 2024-05-09 NOTE — H&P (Signed)
 Electrophysiology H&P  Note    Primary Care Physician: Patient, No Pcp Per Primary Cardiologist: Deatrice Cage, MD Electrophysiologist: OLE ONEIDA HOLTS, MD     Referring Physician: Dr. HOLTS [name] is a 63 y.o. male with a history of HTN, chronic systolic CHF s/p ICD implant on 03/10/24, and persistent atrial fibrillation who presents for consultation in the [facility]. Patient is on Eliquis  5 mg BID for a CHADS2VASC score of 2.  On evaluation today, patient is here for Tikosyn  admission. No new medications since last OV. No benadryl use. No missed doses of anticoagulant. He last took Jardiance  yesterday (8/4) morning.    Today, he denies symptoms of palpitations, chest pain, shortness of breath, orthopnea, PND, lower extremity edema, dizziness, presyncope, syncope, snoring, daytime somnolence, bleeding, or neurologic sequela. The patient is tolerating medications without difficulties and is otherwise without complaint today.   he has a BMI of Body mass index is 31.74 kg/m.SABRA Filed Weights   05/09/24 1315  Weight: 112.1 kg    Current Facility-Administered Medications  Medication Dose Route Frequency Provider Last Rate Last Admin   0.9 %  sodium chloride  infusion  250 mL Intravenous PRN HOLTS OLE ONEIDA, MD       apixaban  (ELIQUIS ) tablet 5 mg  5 mg Oral BID HOLTS OLE ONEIDA, MD       dofetilide  (TIKOSYN ) capsule 500 mcg  500 mcg Oral BID HOLTS OLE ONEIDA, MD       [START ON 05/10/2024] metoprolol  succinate (TOPROL -XL) 24 hr tablet 25 mg  25 mg Oral Daily Suarez, Joseph J, PA-C       sacubitril -valsartan  (ENTRESTO ) 24-26 mg per tablet  1 tablet Oral BID Terra Fairy PARAS, PA-C       sodium chloride  flush (NS) 0.9 % injection 3 mL  3 mL Intravenous Q12H HOLTS OLE ONEIDA, MD       sodium chloride  flush (NS) 0.9 % injection 3 mL  3 mL Intravenous PRN HOLTS OLE ONEIDA, MD        Atrial Fibrillation Management history:  Previous  antiarrhythmic drugs: none Previous cardioversions: 2017, 2021 Previous ablations: none Anticoagulation history: Eliquis    ROS- All systems are reviewed and negative except as per the HPI above.  Physical Exam: BP 115/81 (BP Location: Left Arm)   Pulse 78   Temp (!) 95.9 F (35.5 C) (Axillary)   Resp 18   Ht 6' 2 (1.88 m)   Wt 112.1 kg   SpO2 98%   BMI 31.74 kg/m   GEN: Well nourished, well developed in no acute distress NECK: No JVD; No carotid bruits CARDIAC: Irregularly irregular rate and rhythm, no murmurs, rubs, gallops RESPIRATORY:  Clear to auscultation without rales, wheezing or rhonchi  ABDOMEN: Soft, non-tender, non-distended EXTREMITIES:  No edema; No deformity   EKG today demonstrates  Vent. rate 76 BPM PR interval * ms QRS duration 96 ms QT/QTcB 376/423 ms P-R-T axes * -69 39 Atrial fibrillation Left axis deviation Abnormal ECG When compared with ECG of 11-Mar-2024 08:11, No significant change was found  Echo 06/30/23 demonstrated  1. Left ventricular ejection fraction, by estimation, is 30 to 35%. The  left ventricle has moderately decreased function. The left ventricle  demonstrates global hypokinesis. The left ventricular internal cavity size  was mildly dilated. Left ventricular  diastolic parameters are indeterminate. The average left ventricular  global longitudinal strain is -7.1 %.   2. Right ventricular systolic function is mildly reduced.  The right  ventricular size is normal. There is normal pulmonary artery systolic  pressure. The estimated right ventricular systolic pressure is 25.2 mmHg.   3. Left atrial size was moderately dilated.   4. The mitral valve is normal in structure. Mild to moderate mitral valve  regurgitation. No evidence of mitral stenosis.   5. The aortic valve is tricuspid. Aortic valve regurgitation is mild. No  aortic stenosis is present.   6. There is mild dilatation of the ascending aorta, measuring 40 mm.   7. The  inferior vena cava is normal in size with greater than 50%  respiratory variability, suggesting right atrial pressure of 3 mmHg.    ASSESSMENT & PLAN CHA2DS2-VASc Score = 2  The patient's score is based upon: CHF History: 1 HTN History: 1 Diabetes History: 0 Stroke History: 0 Vascular Disease History: 0 Age Score: 0 Gender Score: 0       ASSESSMENT AND PLAN: Persistent Atrial Fibrillation (ICD10:  I48.19) The patient's CHA2DS2-VASc score is 2, indicating a 2.2% annual risk of stroke.    Patient would like to presents for dofetilide  admission. Continue Eliquis  5 mg BID, states no missed doses in the last 3 weeks. No recent benadryl use PharmD has screened medications QTc in SR 436 ms Labs today currently pending CrCl 112 mL/min from [facility] on 5/21.   Secondary Hypercoagulable State (ICD10:  D68.69) The patient is at significant risk for stroke/thromboembolism based upon his CHA2DS2-VASc Score of 2.  Continue Apixaban  (Eliquis ).  No missed doses.   Pt presents for admission as above.   Ozell Jodie Passey, PA-C  05/09/2024 1:42 PM

## 2024-05-09 NOTE — Progress Notes (Signed)
 Primary Care Physician: Patient, No Pcp Per Primary Cardiologist: Walter Cage, MD Electrophysiologist: Walter ONEIDA HOLTS, MD     Referring Physician: Dr. HOLTS Walter Banks Walter Banks is a 62 y.o. male with a history of HTN, chronic systolic CHF s/p ICD implant on 03/10/24, and persistent atrial fibrillation who presents for consultation in the [facility]. Patient is on Eliquis  5 mg BID for a CHADS2VASC score of 2.  On evaluation today, patient is here for Tikosyn  admission. No new medications since last OV. No benadryl use. No missed doses of anticoagulant. He last took Jardiance  yesterday (8/4) morning.    Today, he denies symptoms of palpitations, chest pain, shortness of breath, orthopnea, PND, lower extremity edema, dizziness, presyncope, syncope, snoring, daytime somnolence, bleeding, or neurologic sequela. The patient is tolerating medications without difficulties and is otherwise without complaint today.   he has a BMI of Body mass index is 31.74 kg/m.SABRA Filed Weights   05/09/24 0853  Weight: 112.1 kg    Current Outpatient Medications  Medication Sig Dispense Refill   apixaban  (ELIQUIS ) 5 MG TABS tablet Take 1 tablet by mouth twice daily 180 tablet 1   empagliflozin  (JARDIANCE ) 10 MG TABS tablet Take 1 tablet (10 mg total) by mouth daily before breakfast. 90 tablet 3   metoprolol  succinate (TOPROL -XL) 50 MG 24 hr tablet Take 25 mg by mouth daily.     multivitamin (ONE-A-DAY MEN'S) TABS tablet Take 1 tablet by mouth daily.     sacubitril -valsartan  (ENTRESTO ) 24-26 MG Take 1 tablet by mouth 2 (two) times daily.     No current facility-administered medications for this encounter.    Atrial Fibrillation Management history:  Previous antiarrhythmic drugs: none Previous cardioversions: 2017, 2021 Previous ablations: none Anticoagulation history: Eliquis    ROS- All systems are reviewed and negative except as per the HPI above.  Physical  Exam: BP 104/64   Pulse 76   Ht 6' 2 (1.88 m)   Wt 112.1 kg   BMI 31.74 kg/m   GEN: Well nourished, well developed in no acute distress NECK: No JVD; No carotid bruits CARDIAC: Irregularly irregular rate and rhythm, no murmurs, rubs, gallops RESPIRATORY:  Clear to auscultation without rales, wheezing or rhonchi  ABDOMEN: Soft, non-tender, non-distended EXTREMITIES:  No edema; No deformity   EKG today demonstrates  Vent. rate 76 BPM PR interval * ms QRS duration 96 ms QT/QTcB 376/423 ms P-R-T axes * -69 39 Atrial fibrillation Left axis deviation Abnormal ECG When compared with ECG of 11-Mar-2024 08:11, No significant change was found  Echo 06/30/23 demonstrated  1. Left ventricular ejection fraction, by estimation, is 30 to 35%. The  left ventricle has moderately decreased function. The left ventricle  demonstrates global hypokinesis. The left ventricular internal cavity size  was mildly dilated. Left ventricular  diastolic parameters are indeterminate. The average left ventricular  global longitudinal strain is -7.1 %.   2. Right ventricular systolic function is mildly reduced. The right  ventricular size is normal. There is normal pulmonary artery systolic  pressure. The estimated right ventricular systolic pressure is 25.2 mmHg.   3. Left atrial size was moderately dilated.   4. The mitral valve is normal in structure. Mild to moderate mitral valve  regurgitation. No evidence of mitral stenosis.   5. The aortic valve is tricuspid. Aortic valve regurgitation is mild. No  aortic stenosis is present.   6. There is mild dilatation of the ascending aorta, measuring 40 mm.  7. The inferior vena cava is normal in size with greater than 50%  respiratory variability, suggesting right atrial pressure of 3 mmHg.    ASSESSMENT & PLAN CHA2DS2-VASc Score = 2  The patient's score is based upon: CHF History: 1 HTN History: 1 Diabetes History: 0 Stroke History: 0 Vascular  Disease History: 0 Age Score: 0 Gender Score: 0       ASSESSMENT AND PLAN: Persistent Atrial Fibrillation (ICD10:  I48.19) The patient's CHA2DS2-VASc score is 2, indicating a 2.2% annual risk of stroke.    Patient would like to presents for dofetilide  admission. Continue Eliquis  5 mg BID, states no missed doses in the last 3 weeks. No recent benadryl use PharmD has screened medications QTc in SR 436 ms Labs today currently pending CrCl 112 mL/min from [facility] on 5/21.   Secondary Hypercoagulable State (ICD10:  D68.69) The patient is at significant risk for stroke/thromboembolism based upon his CHA2DS2-VASc Score of 2.  Continue Apixaban  (Eliquis ).  No missed doses.    Patient will present to admissions when a bed is available.   Terra Pac, PA-C  [facility] [address] [place], [place] [address] [phone]

## 2024-05-10 ENCOUNTER — Other Ambulatory Visit: Payer: Self-pay

## 2024-05-10 DIAGNOSIS — I4819 Other persistent atrial fibrillation: Secondary | ICD-10-CM | POA: Diagnosis not present

## 2024-05-10 LAB — BASIC METABOLIC PANEL WITH GFR
Anion gap: 8 (ref 5–15)
BUN: 18 mg/dL (ref 8–23)
CO2: 24 mmol/L (ref 22–32)
Calcium: 8.8 mg/dL — ABNORMAL LOW (ref 8.9–10.3)
Chloride: 104 mmol/L (ref 98–111)
Creatinine, Ser: 0.93 mg/dL (ref 0.61–1.24)
GFR, Estimated: >60 mL/min (ref >60)
Glucose, Bld: 96 mg/dL (ref 70–99)
Potassium: 4.3 mmol/L (ref 3.5–5.1)
Sodium: 136 mmol/L (ref 135–145)

## 2024-05-10 LAB — MAGNESIUM: Magnesium: 2 mg/dL (ref 1.7–2.4)

## 2024-05-10 MED ORDER — MAGNESIUM SULFATE 2 GM/50ML IV SOLN
2.0000 g | Freq: Once | INTRAVENOUS | Status: AC
  Administered 2024-05-10: 2 g via INTRAVENOUS
  Filled 2024-05-10: qty 50

## 2024-05-10 NOTE — Progress Notes (Signed)
 Pharmacy: Dofetilide  (Tikosyn ) - Follow Up Assessment and Electrolyte Replacement  Pharmacy consulted to assist in monitoring and replacing electrolytes in this 63 y.o. male admitted on 05/09/2024 undergoing dofetilide  initiation.   Labs:    Component Value Date/Time   K 4.3 05/10/2024 0442   MG 2.0 05/10/2024 0442     Plan: Potassium: K >/= 4: No additional supplementation needed  Magnesium : Mg 1.8-2: Give Mg 2 gm IV x1    Thank you for allowing pharmacy to participate in this patient's care   Prentice Poisson, PharmD Clinical Pharmacist **Pharmacist phone directory can now be found on [email or web address] ([email or web address]).  Listed under [facility].

## 2024-05-10 NOTE — Progress Notes (Signed)
 Morning EKG reviewed     Shows pt remains in afib with borderline QTc at ~480 ms.  Continue  Tikosyn  500 mcg BID.   Potassium4.3 (08/06 0442) Magnesium   2.0 (08/06 0442) Creatinine, ser  0.93 (08/06 0442)  Pt will be NPO after midnight for DCCV if remains in afib   Ozell Prentice Passey, PA-C  05/10/2024 11:49 AM

## 2024-05-10 NOTE — TOC CM/SW Note (Signed)
 Transition of Care [facility]) - Inpatient Brief Assessment   Patient Details  Name: Walter Banks MRN: [id number] Date of Birth: April 20, 1961  Transition of Care [facility]) CM/SW Contact:    Sudie Erminio Deems, RN Phone Number: 05/10/2024, 2:27 PM   Clinical Narrative: Patient presented for Tikosyn  Load. Case Manager spoke with the patient regarding co pay cost. Case Manager unable to get a cost from insurance. Patient works at [employer] and he gets his Rx's filled at [facility] usually zero cost. A fib Clinic CMA had submitted a prior authorization on 04-20-24-states pending for review and patient states he never heard back about cost. Case Manager reached out to the CMA to see if the PA was completed. Patient would like to have the initial Rx filled via [facility] and the Rx refills escribed to [facility] [address] in [place]. No further needs identified at this time.    Transition of Care Asessment: Insurance and Status: Insurance coverage has been reviewed Patient has primary care physician: Yes (Cardiology) Home environment has been reviewed: reviewed Prior level of function:: independent Prior/Current Home Services: No current home services Social Drivers of Health Review: SDOH reviewed no interventions necessary Readmission risk has been reviewed: Yes Transition of care needs: no transition of care needs at this time

## 2024-05-10 NOTE — Anesthesia Preprocedure Evaluation (Signed)
 Anesthesia Evaluation  Patient identified by MRN, date of birth, ID band Patient awake    Reviewed: Allergy & Precautions, H&P , NPO status , Patient's Chart, lab work & pertinent test results  Airway Mallampati: II  TM Distance: >3 FB Neck ROM: Full    Dental no notable dental hx. (+) Poor Dentition, Dental Advisory Given   Pulmonary neg pulmonary ROS   Pulmonary exam normal breath sounds clear to auscultation       Cardiovascular Exercise Tolerance: Good hypertension, Pt. on medications and Pt. on home beta blockers +CHF  + dysrhythmias Atrial Fibrillation and Ventricular Tachycardia + pacemaker + Cardiac Defibrillator  Rhythm:Irregular Rate:Tachycardia     Neuro/Psych negative neurological ROS  negative psych ROS   GI/Hepatic negative GI ROS, Neg liver ROS,,,  Endo/Other  negative endocrine ROS    Renal/GU negative Renal ROS  negative genitourinary   Musculoskeletal   Abdominal   Peds  Hematology negative hematology ROS (+)   Anesthesia Other Findings   Reproductive/Obstetrics negative OB ROS                              Anesthesia Physical Anesthesia Plan  ASA: 3  Anesthesia Plan: General   Post-op Pain Management: Minimal or no pain anticipated   Induction: Intravenous  PONV Risk Score and Plan: 2 and Propofol  infusion and Treatment may vary due to age or medical condition  Airway Management Planned: Natural Airway and Nasal Cannula  Additional Equipment:   Intra-op Plan:   Post-operative Plan:   Informed Consent: I have reviewed the patients History and Physical, chart, labs and discussed the procedure including the risks, benefits and alternatives for the proposed anesthesia with the patient or authorized representative who has indicated his/her understanding and acceptance.     Dental advisory given  Plan Discussed with: CRNA  Anesthesia Plan Comments:           Anesthesia Quick Evaluation

## 2024-05-10 NOTE — H&P (View-Only) (Signed)
   Electrophysiology Rounding Note  Patient Name: Walter Banks Date of Encounter: 05/10/2024  Primary Cardiologist: Deatrice Cage, MD  Electrophysiologist: OLE ONEIDA HOLTS, MD    Subjective   Pt remains in afib on Tikosyn  500 mcg BID   QTc from EKG last pm shows stable QTc at ~480 when measured manually and corrects further for rate.  The patient is doing well today.  At this time, the patient denies chest pain, shortness of breath, or any new concerns.  Inpatient Medications    Scheduled Meds:  apixaban   5 mg Oral BID   dofetilide   500 mcg Oral BID   metoprolol  succinate  25 mg Oral Daily   sacubitril -valsartan   1 tablet Oral BID   sodium chloride  flush  3 mL Intravenous Q12H   Continuous Infusions:  sodium chloride      PRN Meds: sodium chloride , sodium chloride  flush   Vital Signs    Vitals:   05/09/24 1629 05/09/24 2015 05/09/24 2313 05/10/24 0523  BP: 102/72 122/89 115/78 112/76  Pulse: 80 85 85 89  Resp: 18 18 20 18   Temp:  98.1 F (36.7 C) 97.9 F (36.6 C) 98 F (36.7 C)  TempSrc: Oral Oral Oral Oral  SpO2:  100% 99% 98%  Weight:      Height:        Intake/Output Summary (Last 24 hours) at 05/10/2024 0703 Last data filed at 05/09/2024 2045 Gross per 24 hour  Intake 240 ml  Output --  Net 240 ml   Filed Weights   05/09/24 1315  Weight: 112.1 kg    Physical Exam    GEN- NAD, A&O x 3. Normal affect.  Lungs- CTAB, Normal effort.  Heart- Irregularly irregular rate and rhythm. No M/G/R GI- Soft, NT, ND Extremities- No clubbing, cyanosis, or edema Skin- no rash or lesion  Labs    CBC No results for input(s): WBC, NEUTROABS, HGB, HCT, MCV, PLT in the last 72 hours. Basic Metabolic Panel Recent Labs    91/94/74 0854 05/10/24 0442  NA 139 136  K 4.1 4.3  CL 100 104  CO2 22 24  GLUCOSE 183* 96  BUN 24 18  CREATININE 0.86 0.93  CALCIUM  8.8 8.8*  MG 2.0 2.0    Telemetry    AF 80-100s with occasional PVCs (personally  reviewed)  Patient Profile     Walter Banks is a 63 y.o. male with a past medical history significant for persistent atrial fibrillation.  They were admitted for tikosyn  load.   Assessment & Plan    Persistent atrial fibrillation Pt remains in afib on Tikosyn  500 mcg BID  Continue Eliquis  Creatinine, ser  0.93 (08/06 0442) Magnesium   2.0 (08/06 0442) Potassium4.3 (08/06 0442) Supplement Mg  If pt does not convert chemically, plan on DCCV tomorrow    For questions or updates, please contact [facility] Please consult [email or web address] for contact info under Cardiology/STEMI.  Signed, Ozell Prentice Passey, PA-C  05/10/2024, 7:03 AM

## 2024-05-10 NOTE — Progress Notes (Signed)
   Electrophysiology Rounding Note  Patient Name: Walter Banks Date of Encounter: 05/10/2024  Primary Cardiologist: Deatrice Cage, MD  Electrophysiologist: OLE ONEIDA HOLTS, MD    Subjective   Pt remains in afib on Tikosyn  500 mcg BID   QTc from EKG last pm shows stable QTc at ~480 when measured manually and corrects further for rate.  The patient is doing well today.  At this time, the patient denies chest pain, shortness of breath, or any new concerns.  Inpatient Medications    Scheduled Meds:  apixaban   5 mg Oral BID   dofetilide   500 mcg Oral BID   metoprolol  succinate  25 mg Oral Daily   sacubitril -valsartan   1 tablet Oral BID   sodium chloride  flush  3 mL Intravenous Q12H   Continuous Infusions:  sodium chloride      PRN Meds: sodium chloride , sodium chloride  flush   Vital Signs    Vitals:   05/09/24 1629 05/09/24 2015 05/09/24 2313 05/10/24 0523  BP: 102/72 122/89 115/78 112/76  Pulse: 80 85 85 89  Resp: 18 18 20 18   Temp:  98.1 F (36.7 C) 97.9 F (36.6 C) 98 F (36.7 C)  TempSrc: Oral Oral Oral Oral  SpO2:  100% 99% 98%  Weight:      Height:        Intake/Output Summary (Last 24 hours) at 05/10/2024 0703 Last data filed at 05/09/2024 2045 Gross per 24 hour  Intake 240 ml  Output --  Net 240 ml   Filed Weights   05/09/24 1315  Weight: 112.1 kg    Physical Exam    GEN- NAD, A&O x 3. Normal affect.  Lungs- CTAB, Normal effort.  Heart- Irregularly irregular rate and rhythm. No M/G/R GI- Soft, NT, ND Extremities- No clubbing, cyanosis, or edema Skin- no rash or lesion  Labs    CBC No results for input(s): WBC, NEUTROABS, HGB, HCT, MCV, PLT in the last 72 hours. Basic Metabolic Panel Recent Labs    91/94/74 0854 05/10/24 0442  NA 139 136  K 4.1 4.3  CL 100 104  CO2 22 24  GLUCOSE 183* 96  BUN 24 18  CREATININE 0.86 0.93  CALCIUM  8.8 8.8*  MG 2.0 2.0    Telemetry    AF 80-100s with occasional PVCs (personally  reviewed)  Patient Profile     Walter Banks is a 63 y.o. male with a past medical history significant for persistent atrial fibrillation.  They were admitted for tikosyn  load.   Assessment & Plan    Persistent atrial fibrillation Pt remains in afib on Tikosyn  500 mcg BID  Continue Eliquis  Creatinine, ser  0.93 (08/06 0442) Magnesium   2.0 (08/06 0442) Potassium4.3 (08/06 0442) Supplement Mg  If pt does not convert chemically, plan on DCCV tomorrow    For questions or updates, please contact [facility] Please consult [email or web address] for contact info under Cardiology/STEMI.  Signed, Ozell Prentice Passey, PA-C  05/10/2024, 7:03 AM

## 2024-05-11 ENCOUNTER — Inpatient Hospital Stay (HOSPITAL_COMMUNITY): Payer: Self-pay | Admitting: Anesthesiology

## 2024-05-11 ENCOUNTER — Encounter (HOSPITAL_COMMUNITY): Payer: Self-pay | Admitting: Cardiology

## 2024-05-11 ENCOUNTER — Other Ambulatory Visit: Payer: Self-pay

## 2024-05-11 ENCOUNTER — Encounter (HOSPITAL_COMMUNITY)
Admission: AD | Disposition: A | Discharge status: Home / Self Care | Payer: Self-pay | Source: Ambulatory Visit | Attending: Cardiology

## 2024-05-11 DIAGNOSIS — I4891 Unspecified atrial fibrillation: Secondary | ICD-10-CM

## 2024-05-11 HISTORY — PX: CARDIOVERSION: EP1203

## 2024-05-11 LAB — BASIC METABOLIC PANEL WITH GFR
Anion gap: 10 (ref 5–15)
BUN: 21 mg/dL (ref 8–23)
CO2: 22 mmol/L (ref 22–32)
Calcium: 8.9 mg/dL (ref 8.9–10.3)
Chloride: 103 mmol/L (ref 98–111)
Creatinine, Ser: 1.08 mg/dL (ref 0.61–1.24)
GFR, Estimated: >60 mL/min (ref >60)
Glucose, Bld: 111 mg/dL — ABNORMAL HIGH (ref 70–99)
Potassium: 4.4 mmol/L (ref 3.5–5.1)
Sodium: 135 mmol/L (ref 135–145)

## 2024-05-11 LAB — CBC
HCT: 49.9 % (ref 39.0–52.0)
Hemoglobin: 17.1 g/dL — ABNORMAL HIGH (ref 13.0–17.0)
MCH: 31.9 pg (ref 26.0–34.0)
MCHC: 34.3 g/dL (ref 30.0–36.0)
MCV: 93.1 fL (ref 80.0–100.0)
Platelets: 199 K/uL (ref 150–400)
RBC: 5.36 MIL/uL (ref 4.22–5.81)
RDW: 12.7 % (ref 11.5–15.5)
WBC: 7.4 K/uL (ref 4.0–10.5)
nRBC: 0 % (ref 0.0–0.2)

## 2024-05-11 LAB — GLUCOSE, CAPILLARY: Glucose-Capillary: 124 mg/dL — ABNORMAL HIGH (ref 70–99)

## 2024-05-11 LAB — MAGNESIUM: Magnesium: 2.1 mg/dL (ref 1.7–2.4)

## 2024-05-11 SURGERY — CARDIOVERSION (CATH LAB)
Anesthesia: General

## 2024-05-11 MED ORDER — SODIUM CHLORIDE 0.9 % IV SOLN: INTRAVENOUS | Status: DC

## 2024-05-11 MED ORDER — LIDOCAINE 2% (20 MG/ML) 5 ML SYRINGE
INTRAMUSCULAR | Status: DC | PRN
  Administered 2024-05-11: 60 mg via INTRAVENOUS

## 2024-05-11 MED ORDER — METOPROLOL SUCCINATE ER 25 MG PO TB24
25.0000 mg | ORAL_TABLET | Freq: Every day | ORAL | Status: DC
Start: 2024-05-12 — End: 2024-05-12

## 2024-05-11 MED ORDER — PROPOFOL 10 MG/ML IV BOLUS
INTRAVENOUS | Status: DC | PRN
Start: 2024-05-11 — End: 2024-05-11
  Administered 2024-05-11: 60 mg via INTRAVENOUS

## 2024-05-11 MED ORDER — SODIUM CHLORIDE 0.9% FLUSH
INTRAVENOUS | Status: DC | PRN
Start: 2024-05-11 — End: 2024-05-11
  Administered 2024-05-11: 10 mL via INTRAVENOUS

## 2024-05-11 SURGICAL SUPPLY — 1 items: PAD DEFIB RADIO PHYSIO CONN (PAD) ×1 IMPLANT

## 2024-05-11 NOTE — Progress Notes (Signed)
   Electrophysiology Rounding Note  Patient Name: Walter Banks Date of Encounter: 05/11/2024  Primary Cardiologist: Deatrice Cage, MD  Electrophysiologist: OLE ONEIDA HOLTS, MD    Subjective   Pt remains in afib on Tikosyn  500 mcg BID   QTc from EKG last pm shows stable QTc at ~460  The patient is doing well today.  At this time, the patient denies chest pain, shortness of breath, or any new concerns.  Inpatient Medications    Scheduled Meds:  apixaban   5 mg Oral BID   dofetilide   500 mcg Oral BID   metoprolol  succinate  25 mg Oral Daily   sacubitril -valsartan   1 tablet Oral BID   sodium chloride  flush  3 mL Intravenous Q12H   Continuous Infusions:  sodium chloride      PRN Meds: sodium chloride  flush   Vital Signs    Vitals:   05/10/24 1626 05/10/24 1922 05/11/24 0012 05/11/24 0453  BP: 119/72 124/81 116/65 116/86  Pulse: 79 97 94 86  Resp: 16 20 18 20   Temp: 98.3 F (36.8 C) (!) 97.5 F (36.4 C) 97.8 F (36.6 C) (!) 97.3 F (36.3 C)  TempSrc: Oral Oral Oral Oral  SpO2: 98% 100% 100% 98%  Weight:      Height:        Intake/Output Summary (Last 24 hours) at 05/11/2024 0748 Last data filed at 05/10/2024 2015 Gross per 24 hour  Intake 290 ml  Output --  Net 290 ml   Filed Weights   05/09/24 1315  Weight: 112.1 kg    Physical Exam    GEN- NAD, A&O x 3. Normal affect.  Lungs- CTAB, Normal effort.  Heart- Irregularly irregular rate and rhythm. No M/G/R GI- Soft, NT, ND Extremities- No clubbing, cyanosis, or edema Skin- no rash or lesion  Labs    CBC Recent Labs    05/11/24 0503  WBC 7.4  HGB 17.1*  HCT 49.9  MCV 93.1  PLT 199   Basic Metabolic Panel Recent Labs    91/93/74 0442 05/11/24 0503  NA 136 135  K 4.3 4.4  CL 104 103  CO2 24 22  GLUCOSE 96 111*  BUN 18 21  CREATININE 0.93 1.08  CALCIUM  8.8* 8.9  MG 2.0 2.1    Telemetry    AF 70-90s (personally reviewed)  Patient Profile     Walter Banks is a 63 y.o.  male with a past medical history significant for persistent atrial fibrillation.  They were admitted for tikosyn  load.   Assessment & Plan    Persistent atrial fibrillation Pt remains in afib on Tikosyn  500 mcg BID  Continue Eliquis  Creatinine, ser  1.08 (08/07 0503) Magnesium   2.1 (08/07 0503) Potassium4.4 (08/07 0503) No electrolyte supplementation needed  If pt does not convert chemically, plan on DCCV today    For questions or updates, please contact [facility] Please consult [email or web address] for contact info under Cardiology/STEMI.  Signed, Ozell Prentice Passey, PA-C  05/11/2024, 7:48 AM

## 2024-05-11 NOTE — Progress Notes (Signed)
 Morning EKG reviewed     Shows is in NSR s/p [facility] with borderline QTc at ~470-480 ms when measured manually and corrected for rate  Continue  Tikosyn  500 mcg BID.   Potassium4.4 (08/07 0503) Magnesium   2.1 (08/07 0503) Creatinine, ser  1.08 (08/07 0503)  Plan for home Friday if QTc remains stable   Ozell Prentice Passey, PA-C  05/11/2024 11:00 AM

## 2024-05-11 NOTE — Progress Notes (Signed)
 s/p cardioversion, pt vagaled while sitting up in bed and preparing to transfer to wheelchair. Returned to stretcher, placed in trendelenberg and put back on [employer]. HR low 50's, BP ~ 70/49 (57). Recovering slowly. Food & drink provided. Dr. Paul paged; no new orders.

## 2024-05-11 NOTE — CV Procedure (Signed)
   Electrical Cardioversion Procedure Note Walter Banks [id number] 1961/05/10  Procedure: Electrical Cardioversion Indications:  Atrial Fibrillation  Time Out: Verified patient identification, verified procedure,medications/allergies/relevent history reviewed, required imaging and test results available.  Performed  Procedure Details  The patient signed informed consent.   The patient was NPO past midnight. Has had therapeutic anticoagulation with Eliquis  greater than 3 weeks. The patient denies any interruption of anticoagulation.  Anesthesia was administered by Dr. Epifanio.  Adequate airway was maintained throughout and vital followed per protocol.  He was cardioverted x 1 with 200J of biphasic synchronized energy.  He converted to NSR.  There were no apparent complications.  The patient tolerated the procedure well and had normal neuro status and respiratory status post procedure with vitals stable as recorded elsewhere.     IMPRESSION:  Successful cardioversion of atrial fibrillation   Follow up: will be send back to the medical floor.  He will continue on current medical therapy.  The patient advised to continue anticoagulation.  Walter Banks 05/11/2024, 8:37 AM

## 2024-05-11 NOTE — Anesthesia Postprocedure Evaluation (Signed)
 Anesthesia Post Note  Patient: Walter Banks  Procedure(s) Performed: CARDIOVERSION     Patient location during evaluation: Cath Lab Anesthesia Type: General Level of consciousness: awake and alert Pain management: pain level controlled Vital Signs Assessment: post-procedure vital signs reviewed and stable Respiratory status: spontaneous breathing, nonlabored ventilation and respiratory function stable Cardiovascular status: blood pressure returned to baseline and stable Postop Assessment: no apparent nausea or vomiting Anesthetic complications: no   No notable events documented.  Last Vitals:  Vitals:   05/11/24 0850 05/11/24 0855  BP: 102/71   Pulse: (!) 57 61  Resp: 17 20  Temp:    SpO2: 95% 93%    Last Pain:  Vitals:   05/11/24 0842  TempSrc:   PainSc: 0-No pain                 [name],W. EDMOND

## 2024-05-11 NOTE — Progress Notes (Signed)
 Pharmacy: Dofetilide  (Tikosyn ) - Follow Up Assessment and Electrolyte Replacement  Pharmacy consulted to assist in monitoring and replacing electrolytes in this 63 y.o. male admitted on 05/09/2024 undergoing dofetilide  initiation. First dofetilide  dose: 8/5  Labs:    Component Value Date/Time   K 4.4 05/11/2024 0503   MG 2.1 05/11/2024 0503     Plan: Potassium: K >/= 4: No additional supplementation needed  Magnesium : Mg > 2: No additional supplementation needed     Thank you for allowing pharmacy to participate in this patient's care   Jinnie Door, PharmD, BCPS, BCCP Clinical Pharmacist  Please check AMION for all [facility] Pharmacy phone numbers After 10:00 PM, call Main Pharmacy [phone]

## 2024-05-11 NOTE — Interval H&P Note (Signed)
 History and Physical Interval Note:  05/11/2024 7:29 AM  Walter Banks  has presented today for surgery, with the diagnosis of afib.  The various methods of treatment have been discussed with the patient and family. After consideration of risks, benefits and other options for treatment, the patient has consented to  Procedure(s): CARDIOVERSION (N/A) as a surgical intervention.  The patient's history has been reviewed, patient examined, no change in status, stable for surgery.  I have reviewed the patient's chart and labs.  Questions were answered to the patient's satisfaction.     [name]

## 2024-05-11 NOTE — Transfer of Care (Signed)
 Immediate Anesthesia Transfer of Care Note  Patient: Walter Banks  Procedure(s) Performed: CARDIOVERSION  Patient Location: Cath Lab  Anesthesia Type:General  Level of Consciousness: awake  Airway & Oxygen Therapy: Patient Spontanous Breathing and Patient connected to nasal cannula oxygen  Post-op Assessment: Report given to RN and Post -op Vital signs reviewed and stable  Post vital signs: Reviewed and stable  Last Vitals:  Vitals Value Taken Time  BP 117/90 (99)   Temp    Pulse 58   Resp 18   SpO2 96     Last Pain:  Vitals:   05/11/24 0811  TempSrc: Temporal  PainSc:          Complications: No notable events documented.

## 2024-05-11 NOTE — Progress Notes (Signed)
  Progress Note   Date: 05/11/2024  Patient Name: Walter Banks        MRN#: [id number]   Clarification of diagnosis:   Obesity class I

## 2024-05-12 ENCOUNTER — Telehealth: Payer: Self-pay | Admitting: Student

## 2024-05-12 ENCOUNTER — Encounter: Payer: Self-pay | Admitting: Student

## 2024-05-12 ENCOUNTER — Other Ambulatory Visit (HOSPITAL_COMMUNITY): Payer: Self-pay

## 2024-05-12 ENCOUNTER — Other Ambulatory Visit: Payer: Self-pay

## 2024-05-12 DIAGNOSIS — I4819 Other persistent atrial fibrillation: Secondary | ICD-10-CM | POA: Diagnosis not present

## 2024-05-12 LAB — BASIC METABOLIC PANEL WITH GFR
Anion gap: 9 (ref 5–15)
BUN: 18 mg/dL (ref 8–23)
CO2: 25 mmol/L (ref 22–32)
Calcium: 9.1 mg/dL (ref 8.9–10.3)
Chloride: 103 mmol/L (ref 98–111)
Creatinine, Ser: 0.95 mg/dL (ref 0.61–1.24)
GFR, Estimated: >60 mL/min (ref >60)
Glucose, Bld: 98 mg/dL (ref 70–99)
Potassium: 4.2 mmol/L (ref 3.5–5.1)
Sodium: 137 mmol/L (ref 135–145)

## 2024-05-12 LAB — MAGNESIUM: Magnesium: 2 mg/dL (ref 1.7–2.4)

## 2024-05-12 MED ORDER — DOFETILIDE 500 MCG PO CAPS: 500.0000 ug | ORAL_CAPSULE | Freq: Two times a day (BID) | ORAL | 6 refills | Status: DC

## 2024-05-12 MED ORDER — MAGNESIUM SULFATE 2 GM/50ML IV SOLN
2.0000 g | Freq: Once | INTRAVENOUS | Status: AC
  Administered 2024-05-12: 2 g via INTRAVENOUS
  Filled 2024-05-12: qty 50

## 2024-05-12 MED ORDER — DOFETILIDE 500 MCG PO CAPS
500.0000 ug | ORAL_CAPSULE | Freq: Two times a day (BID) | ORAL | 6 refills | Status: DC
  Filled 2024-05-12: qty 60, 30d supply, fill #0

## 2024-05-12 NOTE — Telephone Encounter (Addendum)
 Notified after 1730 pm that pt medication was not filled at [facility] pharmacy because his insurance preferentially pays for [employer] Prescriptions at 0 cost.   Called [facility] in [place] to confirm stock and sent new prescription. They are open until 7 pm tonight.   6E staff continue to try to reach patient.

## 2024-05-12 NOTE — Progress Notes (Signed)
 EKG from yesterday evening 05/11/2024 reviewed     Shows remains in NSR with stable QTc at ~480 ms.  Continue  Tikosyn  500 mcg BID.   Potassium4.2 (08/08 0442) Magnesium   2.0 (08/08 0442) Creatinine, ser  0.95 (08/08 0442)  Plan for home this afternoon if QTc remains stable.   Walter Prentice Passey, PA-C  05/12/2024 7:20 AM

## 2024-05-12 NOTE — Progress Notes (Signed)
 Pharmacy: Dofetilide  (Tikosyn ) - Follow Up Assessment and Electrolyte Replacement  Pharmacy consulted to assist in monitoring and replacing electrolytes in this 63 y.o. male admitted on 05/09/2024 undergoing dofetilide  initiation. First dofetilide  dose: 8/5  Labs:    Component Value Date/Time   K 4.2 05/12/2024 0442   MG 2.0 05/12/2024 0442     Plan: Potassium: K >/= 4: No additional supplementation needed  Magnesium : Mg 1.8-2: Give Mg 2 gm IV x1     Thank you for allowing pharmacy to participate in this patient's care   Jinnie JONETTA Door 05/12/2024  8:19 AM

## 2024-05-12 NOTE — Discharge Instructions (Signed)
 Dofetilide  Capsules What is this medication? DOFETILIDE  (doe FET il ide) treats a fast or irregular heartbeat (arrhythmia). It works by slowing down overactive electric signals in the heart, which stabilizes your heart rhythm. It belongs to a group of medications called antiarrhythmics. This medicine may be used for other purposes; ask your health care provider or pharmacist if you have questions. COMMON BRAND NAME(S): Tikosyn  What should I tell my care team before I take this medication? They need to know if you have any of these conditions: Heart disease History of irregular heartbeat History of low levels of potassium or magnesium  in the blood Kidney disease Liver disease An unusual or allergic reaction to dofetilide , other medications, foods, dyes, or preservatives Pregnant or trying to get pregnant Breast-feeding How should I use this medication? Take this medication by mouth with a glass of water. Follow the directions on the prescription label. Do not take with grapefruit juice. You can take it with or without food. If it upsets your stomach, take it with food. Take your medication at regular intervals. Do not take it more often than directed. Do not stop taking except on your care team's advice. A special MedGuide will be given to you by the pharmacist with each prescription and refill. Be sure to read this information carefully each time. Talk to your care team about the use of this medication in children. Special care may be needed. Overdosage: If you think you have taken too much of this medicine contact a poison control center or emergency room at once. NOTE: This medicine is only for you. Do not share this medicine with others. What if I miss a dose? If you miss a dose, skip it. Take your next dose at the normal time. Do not take extra or 2 doses at the same time to make up for the missed dose. What may interact with this medication? Do not take this medication with any of  the following: Benadryl (Diphenhydramine) Cimetidine (Tagamet) Cisapride Dolutegravir Dronedarone Erdafitinib Hydrochlorothiazide Immodium Ketoconazole Megestrol Pimozide Prochlorperazine Thioridazine Trimethoprim Verapamil This medication may also interact with the following: Amiloride Cannabinoids Certain antibiotics like erythromycin or clarithromycin  Certain antiviral medications for HIV or hepatitis Certain medications for depression, anxiety, or psychotic disorders Digoxin  Diltiazem  Grapefruit juice Metformin  Nefazodone Other medications that prolong the QT interval (an abnormal heart rhythm) Quinine Triamterene Zafirlukast Ziprasidone This list may not describe all possible interactions. Give your health care provider a list of all the medicines, herbs, non-prescription drugs, or dietary supplements you use. Also tell them if you smoke, drink alcohol, or use illegal drugs. Some items may interact with your medicine. What should I watch for while using this medication? Your condition will be monitored carefully while you are receiving this medication. What side effects may I notice from receiving this medication? Side effects that you should report to your care team as soon as possible: Allergic reactions--skin rash, itching, hives, swelling of the face, lips, tongue, or throat Chest pain Heart rhythm changes--fast or irregular heartbeat, dizziness, feeling faint or lightheaded, chest pain, trouble breathing Side effects that usually do not require medical attention (report to your care team if they continue or are bothersome): Dizziness Headache Nausea Stomach pain Trouble sleeping This list may not describe all possible side effects. Call your doctor for medical advice about side effects. You may report side effects to FDA at 1-800-FDA-1088. Where should I keep my medication? Keep out of the reach of children. Store at room temperature between 15  and 30  degrees C (59 and 86 degrees F). Throw away any unused medication after the expiration date. NOTE: This sheet is a summary. It may not cover all possible information. If you have questions about this medicine, talk to your doctor, pharmacist, or health care provider.  2024 Elsevier/Gold Standard (2021-08-22 00:00:00)

## 2024-05-12 NOTE — Discharge Summary (Signed)
 ELECTROPHYSIOLOGY DISCHARGE SUMMARY    Patient ID: Walter Banks,  MRN: [id number], DOB/AGE: 12-Jul-1961 63 y.o.  Admit date: 05/09/2024 Discharge date: 05/12/2024  Primary Care Physician: Patient, No Pcp Per  Primary Cardiologist: Deatrice Cage, MD  Electrophysiologist: Dr. Cindie   Primary Discharge Diagnosis:  Persistent atrial fibrillation status post Tikosyn  loading this admission  No Known Allergies   Procedures This Admission:  1.  Tikosyn  loading  2.  Direct current cardioversion on Thursday May 11, 2024 by Dr Sheena which successfully restored SR.  There were no early apparent complications.   Brief HPI: Walter Banks is a 63 y.o. male with a past medical history as noted above.  They were referred to EP for treatment options of atrial fibrillation.  Risks, benefits, and alternatives to Tikosyn  were reviewed with the patient who wished to proceed with admission for loading.  Hospital Course:  The patient was admitted and Tikosyn  was initiated.  Renal function and electrolytes were followed during the hospitalization.  Their QTc remained stable. On 05/11/2024 they underwent direct current cardioversion which restored sinus rhythm. The patients QTc remained stable. They were monitored on telemetry up to discharge. On the day of discharge, they were examined by Dr. Cindie  who considered them stable for discharge to home.  Follow-up has been arranged with the Atrial Fibrillation clinic in approximately 1 week.   Physical Exam: Vitals:   05/11/24 2046 05/12/24 0009 05/12/24 0419 05/12/24 0738  BP: 120/73 127/85 130/76 122/85  Pulse: 65 (!) 57 67 71  Resp: 16 18 16 17   Temp: 97.6 F (36.4 C) 97.6 F (36.4 C) 97.6 F (36.4 C) 97.6 F (36.4 C)  TempSrc: Oral Oral Oral Oral  SpO2: 96% 96% 95% 100%  Weight:      Height:        GEN- NAD, A&O x 3. Normal affect.  Lungs- CTAB, Normal effort.  Heart- Regular rate and rhythm. No M/G/R GI- Soft, NT,  ND Extremities- No clubbing, cyanosis, or edema Skin- no rash or lesion  Labs:   Lab Results  Component Value Date   WBC 7.4 05/11/2024   HGB 17.1 (H) 05/11/2024   HCT 49.9 05/11/2024   MCV 93.1 05/11/2024   PLT 199 05/11/2024    Recent Labs  Lab 05/12/24 0442  NA 137  K 4.2  CL 103  CO2 25  BUN 18  CREATININE 0.95  CALCIUM  9.1  GLUCOSE 98    Discharge Medications:  Allergies as of 05/12/2024   No Known Allergies      Medication List     TAKE these medications    dofetilide  500 MCG capsule Commonly known as: TIKOSYN  Take 1 capsule (500 mcg total) by mouth 2 (two) times daily.   Eliquis  5 MG Tabs tablet Generic drug: apixaban  Take 1 tablet by mouth twice daily   empagliflozin  10 MG Tabs tablet Commonly known as: Jardiance  Take 1 tablet (10 mg total) by mouth daily before breakfast.   Entresto  24-26 MG Generic drug: sacubitril -valsartan  Take 1 tablet by mouth 2 (two) times daily.   metoprolol  succinate 50 MG 24 hr tablet Commonly known as: TOPROL -XL Take 25 mg by mouth daily.   multivitamin Tabs tablet Take 1 tablet by mouth daily.        Disposition:  Home with follow up in AF clinic in 1 week as in AVS.   Duration of Discharge Encounter:  APP time: 20 minutes  Signed, Ozell Prentice Passey, PA-C  05/12/2024  11:07 AM

## 2024-05-13 ENCOUNTER — Other Ambulatory Visit (HOSPITAL_COMMUNITY): Payer: Self-pay

## 2024-05-15 ENCOUNTER — Other Ambulatory Visit: Payer: Self-pay

## 2024-05-15 ENCOUNTER — Other Ambulatory Visit (HOSPITAL_COMMUNITY): Payer: Self-pay

## 2024-05-18 ENCOUNTER — Ambulatory Visit (HOSPITAL_COMMUNITY): Admitting: Physician Assistant

## 2024-06-01 NOTE — Telephone Encounter (Signed)
 Faxed from [facility] requesting a prescription change, a cost saving prescription, to change medication Jardiance  10 mg to Steglatro 5 mg or 15 mg. Ref# [id number]  Ph# [phone] Please address

## 2024-06-01 NOTE — Telephone Encounter (Signed)
 Called and spoke with Melissa w/[employer] at [phone] and after providing ref #; [id number] and verifying patient's name and birthday, was able to relay the provider recommendation of patient staying on Jardiance  10 mg.  Melissa verbalized understanding.

## 2024-06-02 ENCOUNTER — Ambulatory Visit (INDEPENDENT_AMBULATORY_CARE_PROVIDER_SITE_OTHER)

## 2024-06-02 ENCOUNTER — Ambulatory Visit (HOSPITAL_COMMUNITY)
Admission: RE | Admit: 2024-06-02 | Discharge: 2024-06-02 | Disposition: A | Discharge status: Home / Self Care | Source: Ambulatory Visit | Attending: Physician Assistant | Admitting: Physician Assistant

## 2024-06-02 VITALS — BP 114/70 | HR 59 | Ht 74.0 in | Wt 241.8 lb

## 2024-06-02 DIAGNOSIS — Z79899 Other long term (current) drug therapy: Secondary | ICD-10-CM | POA: Diagnosis not present

## 2024-06-02 DIAGNOSIS — D6869 Other thrombophilia: Secondary | ICD-10-CM | POA: Diagnosis not present

## 2024-06-02 DIAGNOSIS — Z5181 Encounter for therapeutic drug level monitoring: Secondary | ICD-10-CM | POA: Diagnosis not present

## 2024-06-02 DIAGNOSIS — I4819 Other persistent atrial fibrillation: Secondary | ICD-10-CM | POA: Diagnosis not present

## 2024-06-02 DIAGNOSIS — I472 Ventricular tachycardia, unspecified: Secondary | ICD-10-CM

## 2024-06-02 DIAGNOSIS — I4891 Unspecified atrial fibrillation: Secondary | ICD-10-CM

## 2024-06-02 NOTE — Progress Notes (Signed)
 Primary Care Physician: Patient, No Pcp Per Primary Cardiologist: Walter Cage, MD Electrophysiologist: Walter ONEIDA HOLTS, MD  Referring Physician: Dr. HOLTS Walter Banks Walter Banks is a 63 y.o. male with a history of HTN, chronic systolic CHF s/p ICD implant on 03/10/24, and persistent atrial fibrillation who presents for follow up in the [facility] Atrial Fibrillation Clinic. Patient is on Eliquis  5 mg BID for stroke prevention.   Patient returns for follow up for atrial fibrillation and dofetilide  monitoring. He is s/p dofetilide  admission 8/5-05/12/24 with DCCV on 05/11/24. He remains in SR today and is feeling well. No bleeding issues on anticoagulation. He does report being woken from sleep suddenly 05/27/24 feeling like he had grabbed an electric cattle fence and wonders if he had a shock from the ICD. Interrogation today showed no events or therapies delivered.   Today, he  denies symptoms of palpitations, chest pain, shortness of breath, orthopnea, PND, lower extremity edema, dizziness, presyncope, syncope, snoring, daytime somnolence, bleeding, or neurologic sequela. The patient is tolerating medications without difficulties and is otherwise without complaint today.    he has a BMI of Body mass index is 31.05 kg/m.Walter Banks Filed Weights   06/02/24 0933  Weight: 109.7 kg     Current Outpatient Medications  Medication Sig Dispense Refill   apixaban  (ELIQUIS ) 5 MG TABS tablet Take 1 tablet by mouth twice daily 180 tablet 1   dofetilide  (TIKOSYN ) 500 MCG capsule Take 1 capsule (500 mcg total) by mouth 2 (two) times daily. 60 capsule 6   empagliflozin  (JARDIANCE ) 10 MG TABS tablet Take 1 tablet (10 mg total) by mouth daily before breakfast. 90 tablet 3   metoprolol  succinate (TOPROL -XL) 50 MG 24 hr tablet Take 25 mg by mouth daily.     multivitamin (ONE-A-DAY MEN'S) TABS tablet Take 1 tablet by mouth daily.     sacubitril -valsartan  (ENTRESTO ) 24-26 MG Take 1 tablet by mouth 2 (two)  times daily.     No current facility-administered medications for this encounter.    Atrial Fibrillation Management history:  Previous antiarrhythmic drugs: dofetilide   Previous cardioversions: 2017, 2021, 05/11/24 Previous ablations: none Anticoagulation history: Eliquis    ROS- All systems are reviewed and negative except as per the HPI above.  Physical Exam: BP 114/70   Pulse (!) 59   Ht 6' 2 (1.88 m)   Wt 109.7 kg   BMI 31.05 kg/m    GEN: Well nourished, well developed in no acute distress CARDIAC: Regular rate and rhythm, no murmurs, rubs, gallops RESPIRATORY:  Clear to auscultation without rales, wheezing or rhonchi  ABDOMEN: Soft, non-tender, non-distended EXTREMITIES:  No edema; No deformity    EKG today demonstrates  SB, PVC, LAFB Vent. rate 59 BPM PR interval 188 ms QRS duration 90 ms QT/QTcB 436/431 ms   Echo 06/30/23 demonstrated  1. Left ventricular ejection fraction, by estimation, is 30 to 35%. The  left ventricle has moderately decreased function. The left ventricle  demonstrates global hypokinesis. The left ventricular internal cavity size  was mildly dilated. Left ventricular diastolic parameters are indeterminate. The average left ventricular global longitudinal strain is -7.1 %.   2. Right ventricular systolic function is mildly reduced. The right  ventricular size is normal. There is normal pulmonary artery systolic  pressure. The estimated right ventricular systolic pressure is 25.2 mmHg.   3. Left atrial size was moderately dilated.   4. The mitral valve is normal in structure. Mild to moderate mitral valve  regurgitation. No  evidence of mitral stenosis.   5. The aortic valve is tricuspid. Aortic valve regurgitation is mild. No  aortic stenosis is present.   6. There is mild dilatation of the ascending aorta, measuring 40 mm.   7. The inferior vena cava is normal in size with greater than 50%  respiratory variability, suggesting right atrial  pressure of 3 mmHg.    CHA2DS2-VASc Score = 2  The patient's score is based upon: CHF History: 1 HTN History: 1 Diabetes History: 0 Stroke History: 0 Vascular Disease History: 0 Age Score: 0 Gender Score: 0       ASSESSMENT AND PLAN: Persistent Atrial Fibrillation (ICD10:  I48.19) The patient's CHA2DS2-VASc score is 2, indicating a 2.2% annual risk of stroke.   S/p dofetilide  loading 05/2024 Patient appears to be maintaining SR Continue Eliquis  5 mg BID Continue dofetilide  500 mcg BID Continue Toprol  25 mg daily  Secondary Hypercoagulable State (ICD10:  D68.69) The patient is at significant risk for stroke/thromboembolism based upon his CHA2DS2-VASc Score of 2.  Continue Apixaban  (Eliquis ). No bleeding issues.   High Risk Medication Monitoring (ICD 10: U5195107) Patient requires ongoing monitoring for anti-arrhythmic medication which has the potential to cause life threatening arrhythmias. QT interval on ECG acceptable for dofetilide  monitoring. Check bmet/mag today.     Chronic HFrEF EF 30-35%, s/p ICD followed by Dr Walter Banks GDMT per primary cardiology team Fluid status appears stable today       Follow up with Walter Banks as scheduled.     Walter Kicks PA-C Afib Clinic [facility] [address] [place], [place] [address] [phone]

## 2024-06-02 NOTE — Progress Notes (Signed)
 Patient seen by AF clinic today and reports receiving what he thought was a shock on Saturday 8/23 in the am around 1-2am.  Recent new start on Tikosyn .  Patient said he woke up feeling a sudden jolt and thought he might have been shocked.   I was able to interrogate patient's device here in clinic today.  There have been no VT/VF events and no therapies recorded by device.  Last episodes for NSVT were on 05/04/24.  Normal device function.  Reviewed with Charlies Arthur PA-C and AF clinic [facility] [place] also made aware.  No changes, will continue monitoring.

## 2024-06-18 NOTE — Progress Notes (Deleted)
 Electrophysiology Clinic Note    Date:  06/18/2024  Patient ID:  CHIEF WALKUP, DOB [birth year], MRN [id number] PCP:  Patient, No Pcp Per  Cardiologist:  Deatrice Cage, MD   Electrophysiologist:  OLE ONEIDA HOLTS, MD    ***refresh  Discussed the use of AI scribe software for clinical note transcription with the patient, who gave verbal consent to proceed.   Patient Profile    Chief Complaint: Tikosyn , ICD implant follow-up  History of Present Illness: Walter Banks is a 63 y.o. male with PMH notable for HFrEF s/p ICD, persis afib, HTN; seen today for OLE ONEIDA HOLTS, MD for routine electrophysiology follow-up s/p Defibrillator implant and tikosyn  loading.   He is s/p ICD implant 03/2024 for HFrEF, and s/p tikosyn  loading admission 05/2024. He did require DCCV during load. He last saw PA Fenton 06/02/2024 where he was maintaining sinus rhythm.   On follow-up today, *** AF burden, symptoms *** palpitations *** bleeding concerns  - needs BMP, mag  - chronic leads  - needs gen cards appt    Since last being seen in our clinic the patient reports doing ***.  he denies chest pain, palpitations, dyspnea, PND, orthopnea, nausea, vomiting, dizziness, syncope, edema, weight gain, or early satiety.       Arrhythmia/Device History BSX dual chamber ICD, imp 03/2024; dx afib, HFrEF  AAD -  Tikosyn , loaded 05/2024    ROS:  Please see the history of present illness. All other systems are reviewed and otherwise negative.    Physical Exam    VS:  There were no vitals taken for this visit. BMI: There is no height or weight on file to calculate BMI.      Wt Readings from Last 3 Encounters:  06/02/24 241 lb 12.8 oz (109.7 kg)  05/09/24 247 lb 3.2 oz (112.1 kg)  05/09/24 247 lb 3.2 oz (112.1 kg)     GEN- The patient is well appearing, alert and oriented x 3 today.   Lungs- Clear to ausculation bilaterally, normal work of breathing.  Heart- {Blank  single:19197::Regular,Irregularly irregular} rate and rhythm, no murmurs, rubs or gallops Extremities- {EDEMA LEVEL:28147::No} peripheral edema, warm, dry Skin-  *** device pocket well-healed, no tethering   Device interrogation done today and reviewed by myself:  Battery *** Lead thresholds, impedence, sensing stable *** *** episodes *** changes made today   Studies Reviewed   Previous EP, cardiology notes.    EKG is ordered. Personal review of EKG from today shows:  ***         06/02/2024 EKG - SR with rare PVC, rate 59, LAFB; QTC 431  8/***/2025 EKG - SR with PAC w rare PVC, rate 67; LAD; QTC 490    Cardiac MRI, 12/31/2023 1. Moderate-severely reduced LV systolic function, LVEF 32%, with normal left ventricular chamber size by indexed volume. No definite delayed myocardial enhancement, no myocardial edema. Findings most consistent with nonischemic cardiomyopathy. 2. Mildly reduced RV systolic function, RVEF 45%, with normal right ventricular chamber size. No LGE. 3.  Mild mitral valve regurgitation.  Cardiac Pet, 10/21/2023   LV perfusion is normal. There is no evidence of ischemia. There is no evidence of infarction.   Stress left ventricular function is abnormal. Stress global function is severely reduced. Stress EF: 25%. End diastolic cavity size is severely enlarged.  Resting EF and TID could not be assessed due to significant blood pool activity.   Myocardial blood flow reserve is not reported in this patient  due to technical or patient-specific concerns that affect accuracy (reduced rest and stress myocardial blood flow in the setting of severe systolic dysfunction).   Coronary calcium  was present on the attenuation correction CT images. Mild coronary calcifications were present. Coronary calcifications were present in the left anterior descending artery distribution(s).   Findings are consistent with no ischemia and no infarction. The study is high risk due to  severely reduced LVEF.  TTE, 06/30/2023  1. Left ventricular ejection fraction, by estimation, is 30 to 35%. The  left ventricle has moderately decreased function. The left ventricle  demonstrates global hypokinesis. The left ventricular internal cavity size  was mildly dilated. Left ventricular diastolic parameters are indeterminate. The average left ventricular global longitudinal strain is -7.1 %.   2. Right ventricular systolic function is mildly reduced. The right  ventricular size is normal. There is normal pulmonary artery systolic  pressure. The estimated right ventricular systolic pressure is 25.2 mmHg.   3. Left atrial size was moderately dilated.   4. The mitral valve is normal in structure. Mild to moderate mitral valve  regurgitation. No evidence of mitral stenosis.   5. The aortic valve is tricuspid. Aortic valve regurgitation is mild. No  aortic stenosis is present.   6. There is mild dilatation of the ascending aorta, measuring 40 mm.   7. The inferior vena cava is normal in size with greater than 50%  respiratory variability, suggesting right atrial pressure of 3 mmHg.     Assessment and Plan     #) persis AFib #) tikosyn  monitoring   #) Hypercoag d/t  afib CHA2DS2-VASc Score = at least 2 [CHF History: 1, HTN History: 1, Diabetes History: 0, Stroke History: 0, Vascular Disease History: 0, Age Score: 0, Gender Score: 0].  Therefore, the patient's annual risk of stroke is 2.2 %.     {Confirm score is correct.  If not, click here to update score.  REFRESH note.  :1}   Stroke ppx - 5mg  eliquis  BID, appropriately dosed No bleeding concerns   #) NICM s/p ICD ***   #) ***   {Are you ordering a CV Procedure (e.g. stress test, cath, DCCV, TEE, etc)?   Press F2        :[id number]}   Current medicines are reviewed at length with the patient today.   The patient {ACTIONS; HAS/DOES NOT HAVE:19233} concerns regarding his medicines.  The following changes were made  today:  {NONE DEFAULTED:18576}  Labs/ tests ordered today include: *** No orders of the defined types were placed in this encounter.    Disposition: Follow up with {EPMDS:28135::EP Team} or EP APP {EPFOLLOW UP:28173}   Signed, Chantal Needle, NP  06/18/24  11:56 AM  Electrophysiology [facility]

## 2024-06-19 ENCOUNTER — Ambulatory Visit: Attending: Cardiology | Admitting: Cardiology

## 2024-06-19 DIAGNOSIS — I4819 Other persistent atrial fibrillation: Secondary | ICD-10-CM

## 2024-06-19 DIAGNOSIS — Z95810 Presence of automatic (implantable) cardiac defibrillator: Secondary | ICD-10-CM

## 2024-06-19 DIAGNOSIS — D6869 Other thrombophilia: Secondary | ICD-10-CM

## 2024-06-19 DIAGNOSIS — Z5181 Encounter for therapeutic drug level monitoring: Secondary | ICD-10-CM

## 2024-06-19 DIAGNOSIS — I428 Other cardiomyopathies: Secondary | ICD-10-CM

## 2024-07-10 NOTE — Progress Notes (Signed)
 Remote ICD Transmission

## 2024-07-18 ENCOUNTER — Other Ambulatory Visit: Payer: Self-pay | Admitting: Medical

## 2024-07-18 DIAGNOSIS — I428 Other cardiomyopathies: Secondary | ICD-10-CM

## 2024-07-20 NOTE — Telephone Encounter (Signed)
Please contact pt for future appointment. Pt overdue for 3 month f/u. Pt needing refills.

## 2024-07-24 ENCOUNTER — Ambulatory Visit (INDEPENDENT_AMBULATORY_CARE_PROVIDER_SITE_OTHER)

## 2024-07-24 ENCOUNTER — Other Ambulatory Visit (HOSPITAL_COMMUNITY): Payer: Self-pay

## 2024-07-24 DIAGNOSIS — I4891 Unspecified atrial fibrillation: Secondary | ICD-10-CM | POA: Diagnosis not present

## 2024-07-24 NOTE — Telephone Encounter (Signed)
 Left voice mail

## 2024-07-26 LAB — CUP PACEART REMOTE DEVICE CHECK
Battery Remaining Longevity: 156 mo
Battery Remaining Percentage: 100 %
Brady Statistic RA Percent Paced: 6 %
Brady Statistic RV Percent Paced: 1 %
Date Time Interrogation Session: 20251020012500
HighPow Impedance: 78 Ohm
Implantable Lead Connection Status: 753985
Implantable Lead Connection Status: 753985
Implantable Lead Implant Date: 20250606
Implantable Lead Implant Date: 20250606
Implantable Lead Location: 753859
Implantable Lead Location: 753860
Implantable Lead Model: 673
Implantable Lead Model: 7841
Implantable Lead Serial Number: 1579360
Implantable Lead Serial Number: 2599026
Implantable Pulse Generator Implant Date: 20250606
Lead Channel Impedance Value: 416 Ohm
Lead Channel Impedance Value: 656 Ohm
Lead Channel Pacing Threshold Amplitude: 0.6 V
Lead Channel Pacing Threshold Amplitude: 0.6 V
Lead Channel Pacing Threshold Pulse Width: 0.4 ms
Lead Channel Pacing Threshold Pulse Width: 0.4 ms
Lead Channel Setting Pacing Amplitude: 3.5 V
Lead Channel Setting Pacing Amplitude: 3.5 V
Lead Channel Setting Pacing Pulse Width: 0.4 ms
Lead Channel Setting Sensing Sensitivity: 0.5 mV
Pulse Gen Serial Number: 695542
Zone Setting Status: 755011

## 2024-07-28 NOTE — Progress Notes (Signed)
 Remote ICD Transmission

## 2024-07-31 ENCOUNTER — Ambulatory Visit: Payer: Self-pay | Admitting: Cardiology

## 2024-07-31 NOTE — Telephone Encounter (Signed)
Pt scheduled on 10/31

## 2024-08-02 ENCOUNTER — Other Ambulatory Visit: Payer: Self-pay | Admitting: Medical

## 2024-08-03 NOTE — Progress Notes (Unsigned)
 Electrophysiology Clinic Note    Date:  08/04/2024  Patient ID:  Walter, Banks 1961-08-22, MRN [id number] PCP:  Patient, No Pcp Per  Cardiologist:  Walter Cage, MD  Electrophysiologist:  Walter ONEIDA HOLTS, MD  Electrophysiology APP:  Walter Neiss, NP     Discussed the use of AI scribe software for clinical note transcription with the patient, who gave verbal consent to proceed.   Patient Profile    Chief Complaint: Walter Banks , ICD implant follow-up  History of Present Illness: Walter Banks is a 63 y.o. male with PMH notable for HFrEF s/p ICD, persis afib, HTN; seen today for Walter ONEIDA HOLTS, MD for routine electrophysiology follow-up s/p Defibrillator implant and Walter Banks  loading.   He is s/p ICD implant 03/2024 for HFrEF, and s/p Walter Banks  loading admission 05/2024. He did require DCCV during load. He last saw PA Walter Banks 06/02/2024 where he was maintaining sinus rhythm.   On follow-up today, he is feeling very well. He is not aware of any AF episodes since initiating Walter Banks . He takes medication 9a/9p without missing doses.  He continues to take eliquis  BID too, no bleeding concerns.  He has no complaints regarding his ICD - has forgotten it's there.   He denies chest pain, chest pressure, palpitations, edema or presyncope.    Continues to work at [employer].    Arrhythmia/Device History BSX dual chamber ICD, imp 03/2024; dx afib, HFrEF  AAD -  Walter Banks , loaded 05/2024    ROS:  Please see the history of present illness. All other systems are reviewed and otherwise negative.    Physical Exam    VS:  BP 126/82   Pulse (!) 59   Ht 6' 3 (1.905 m)   Wt 243 lb 3.2 oz (110.3 kg)   SpO2 98%   BMI 30.40 kg/m  BMI: Body mass index is 30.4 kg/m.           Wt Readings from Last 3 Encounters:  08/04/24 243 lb 3.2 oz (110.3 kg)  06/02/24 241 lb 12.8 oz (109.7 kg)  05/09/24 247 lb 3.2 oz (112.1 kg)     GEN- The patient is well  appearing, alert and oriented x 3 today.   Lungs- Clear to ausculation bilaterally, normal work of breathing.  Heart- Regular rate and rhythm, no murmurs, rubs or gallops Extremities- No peripheral edema, warm, dry Skin-  device pocket well-healed, no tethering  Device interrogation done today and reviewed by myself:  Battery 13 years Lead thresholds, impedence, sensing stable  No ventricular or atrial arrhythmias  Adjusted threshold to chronic level  Studies Reviewed   Previous EP, cardiology notes.    EKG is ordered. Personal review of EKG from today shows:    EKG Interpretation Date/Time:  Friday August 04 2024 10:16:40 EDT Ventricular Rate:  59 PR Interval:  182 QRS Duration:  98 QT Interval:  462 QTC Calculation: 457 R Axis:   -28  Text Interpretation: Sinus bradycardia with Premature atrial complexes Confirmed by Walter Banks [phone]) on 08/04/2024 10:25:34 AM    06/02/2024 EKG - SR with rare PVC, rate 59, LAFB; QTC 431  05/12/2024 EKG - SR with PAC w rare PVC, rate 67; LAD; QTC 490    Cardiac MRI, 12/31/2023 1. Moderate-severely reduced LV systolic function, LVEF 32%, with normal left ventricular chamber size by indexed volume. No definite delayed myocardial enhancement, no myocardial edema. Findings most consistent with nonischemic cardiomyopathy. 2. Mildly reduced RV systolic function, RVEF 45%, with  normal right ventricular chamber size. No LGE. 3.  Mild mitral valve regurgitation.  Cardiac Pet, 10/21/2023   LV perfusion is normal. There is no evidence of ischemia. There is no evidence of infarction.   Stress left ventricular function is abnormal. Stress global function is severely reduced. Stress EF: 25%. End diastolic cavity size is severely enlarged.  Resting EF and TID could not be assessed due to significant blood pool activity.   Myocardial blood flow reserve is not reported in this patient due to technical or patient-specific concerns that affect accuracy  (reduced rest and stress myocardial blood flow in the setting of severe systolic dysfunction).   Coronary calcium  was present on the attenuation correction CT images. Mild coronary calcifications were present. Coronary calcifications were present in the left anterior descending artery distribution(s).   Findings are consistent with no ischemia and no infarction. The study is high risk due to severely reduced LVEF.  TTE, 06/30/2023  1. Left ventricular ejection fraction, by estimation, is 30 to 35%. The left ventricle has moderately decreased function. The left ventricle demonstrates global hypokinesis. The left ventricular internal cavity size was mildly dilated. Left ventricular diastolic parameters are indeterminate. The average left ventricular global longitudinal strain is -7.1 %.   2. Right ventricular systolic function is mildly reduced. The right ventricular size is normal. There is normal pulmonary artery systolic pressure. The estimated right ventricular systolic pressure is 25.2 mmHg.   3. Left atrial size was moderately dilated.   4. The mitral valve is normal in structure. Mild to moderate mitral valve regurgitation. No evidence of mitral stenosis.   5. The aortic valve is tricuspid. Aortic valve regurgitation is mild. No aortic stenosis is present.   6. There is mild dilatation of the ascending aorta, measuring 40 mm.   7. The inferior vena cava is normal in size with greater than 50% respiratory variability, suggesting right atrial pressure of 3 mmHg.      Assessment and Plan    #) persis AFib #) Walter Banks  monitoring Walter Banks  loading admission 05/2024 No symptomatic AFib since loading, no arrhythmias on ICD EKG with stable QTC today Update BMP, mag today  #) Hypercoag d/t  afib CHA2DS2-VASc Score = at least 2 [CHF History: 1, HTN History: 1, Diabetes History: 0, Stroke History: 0, Vascular Disease History: 0, Age Score: 0, Gender Score: 0].  Therefore, the patient's annual risk of  stroke is 2.2 %.    Stroke ppx - 5mg  eliquis  BID, appropriately dosed No bleeding concerns   #) NICM s/p ICD Battery good Stable lead measurements No ventricular arrhythmias noted  #) HFrEF Appears warm and euvolemic on exam with good exercise capacity Continue 10mg  jardiance , 50mg  toprol , 24-26 entresto  No diuretic indicated      Current medicines are reviewed at length with the patient today.   The patient does not have concerns regarding his medicines.  The following changes were made today:  none  Labs/ tests ordered today include:  Orders Placed This Encounter  Procedures   Basic metabolic panel with GFR   Magnesium    EKG 12-Lead     Disposition: Follow up with Dr. Cindie or EP APP in 3 months   Follow-up with Dr. Darron or NP [place] in ~6 weeks for routine general cardiology follow-up  Signed, Chantal Needle, NP  08/04/24  12:13 PM  Electrophysiology [facility]

## 2024-08-04 ENCOUNTER — Ambulatory Visit: Attending: Cardiology | Admitting: Cardiology

## 2024-08-04 ENCOUNTER — Encounter: Payer: Self-pay | Admitting: Cardiology

## 2024-08-04 VITALS — BP 126/82 | HR 59 | Ht 75.0 in | Wt 243.2 lb

## 2024-08-04 DIAGNOSIS — Z5181 Encounter for therapeutic drug level monitoring: Secondary | ICD-10-CM

## 2024-08-04 DIAGNOSIS — I4819 Other persistent atrial fibrillation: Secondary | ICD-10-CM

## 2024-08-04 DIAGNOSIS — Z95810 Presence of automatic (implantable) cardiac defibrillator: Secondary | ICD-10-CM | POA: Diagnosis not present

## 2024-08-04 DIAGNOSIS — Z79899 Other long term (current) drug therapy: Secondary | ICD-10-CM

## 2024-08-04 DIAGNOSIS — I428 Other cardiomyopathies: Secondary | ICD-10-CM

## 2024-08-04 DIAGNOSIS — D6869 Other thrombophilia: Secondary | ICD-10-CM

## 2024-08-04 LAB — CUP PACEART INCLINIC DEVICE CHECK
Date Time Interrogation Session: 20250829105337
Implantable Lead Connection Status: 753985
Implantable Lead Connection Status: 753985
Implantable Lead Implant Date: 20250606
Implantable Lead Implant Date: 20250606
Implantable Lead Location: 753859
Implantable Lead Location: 753860
Implantable Lead Model: 673
Implantable Lead Model: 7841
Implantable Lead Serial Number: 1579360
Implantable Lead Serial Number: 2599026
Implantable Pulse Generator Implant Date: 20250606
Pulse Gen Serial Number: 695542

## 2024-08-04 NOTE — Patient Instructions (Addendum)
 Medication Instructions:   Your physician recommends that you continue on your current medications as directed. Please refer to the Current Medication list given to you today.   *If you need a refill on your cardiac medications before your next appointment, please call your pharmacy*  Lab Work:  Your provider would like for you to have following labs drawn today BMP, MAG.   If you have labs (blood work) drawn today and your tests are completely normal, you will receive your results only by: MyChart Message (if you have MyChart) OR A paper copy in the mail If you have any lab test that is abnormal or we need to change your treatment, we will call you to review the results.  Testing/Procedures:  No test ordered today   Follow-Up: At [facility], you and your health needs are our priority.  As part of our continuing mission to provide you with exceptional heart care, our providers are all part of one team.  This team includes your primary Cardiologist (physician) and Advanced Practice Providers or APPs (Physician Assistants and Nurse Practitioners) who all work together to provide you with the care you need, when you need it.  Your next appointment:    6 week(s)  with Barnie Hila, NP  3 months with Suzann Riddle, NP   We recommend signing up for the patient portal called MyChart.  Sign up information is provided on this After Visit Summary.  MyChart is used to connect with patients for Virtual Visits (Telemedicine).  Patients are able to view lab/test results, encounter notes, upcoming appointments, etc.  Non-urgent messages can be sent to your provider as well.   To learn more about what you can do with MyChart, go to [email or web address].   Other Instructions

## 2024-08-05 LAB — BASIC METABOLIC PANEL WITH GFR
BUN/Creatinine Ratio: 20 (ref 10–24)
BUN: 18 mg/dL (ref 8–27)
CO2: 23 mmol/L (ref 20–29)
Calcium: 9.1 mg/dL (ref 8.6–10.2)
Chloride: 102 mmol/L (ref 96–106)
Creatinine, Ser: 0.89 mg/dL (ref 0.76–1.27)
Glucose: 95 mg/dL (ref 70–99)
Potassium: 4.4 mmol/L (ref 3.5–5.2)
Sodium: 141 mmol/L (ref 134–144)
eGFR: 96 mL/min/1.73 (ref >59)

## 2024-08-05 LAB — MAGNESIUM: Magnesium: 2.3 mg/dL (ref 1.6–2.3)

## 2024-08-07 ENCOUNTER — Ambulatory Visit: Payer: Self-pay | Admitting: Cardiology

## 2024-09-10 NOTE — Progress Notes (Unsigned)
 Cardiology Clinic Note   Date: 09/10/2024 ID: RODDY BELLAMY, DOB Feb 25, 1961, MRN [id number]  [facility] Providers Cardiologist:  Deatrice Cage, MD Electrophysiologist:  OLE ONEIDA HOLTS, MD  Electrophysiology APP:  Riddle, Suzann, NP { Click to update primary MD,subspecialty MD or APP then REFRESH:1}    Chief Complaint   TREVAR BOEHRINGER is a 63 y.o. male who presents to the clinic today for ***  Patient Profile   JAHEIM CANINO is followed by *** for the history outlined below.      Past medical history significant for: Nonobstructive CAD. R/LHC 12/10/2015: Ostial to proximal LAD 40%.  Severe left ventricular systolic dysfunction.  Severely elevated right heart pressures including wedge pressure. Cardiac PET/CT 10/21/2023: Normal LV perfusion.  No evidence of ischemia.  No evidence of infarction.  Stress EF 25%.  High risk study due to severity of reduced LVEF. Chronic systolic heart failure/nonischemic cardiomyopathy. Echo 06/30/2023: EF 30 to 35%.  Global hypokinesis.  Indeterminate diastolic parameters.  Mildly reduced RV function.  Normal PA pressure.  Moderate LAE.  Mild to moderate MR.  Mild AI.  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 Cardiac MRI 12/31/2023: Moderate to severely reduced LV systolic function, EF 32%, normal LV chamber size.  No definite delayed myocardial enhancement, no myocardial edema.  Findings most consistent with nonischemic cardiomyopathy.  Mildly reduced RV systolic function, EF 45%, normal right ventricular chamber size, no LGE.  Mild mitral valve regurgitation. ICD implantation 03/10/2024: [employer] ICD. Remote device check 07/24/2024: Normal device function.  Battery and lead parameters stable with stable capture and sensing.  Device programming is appropriate.  No arrhythmias noted. PAF. Onset ~March 2017. DCCV 01/10/2016. DCCV 03/26/2020. DCCV 07/30/2023. DCCV 05/11/2024 during hospital admission for Tikosyn   loading. Hyperlipidemia. Lipid panel 06/01/2023: LDL 138, HDL 47, TG 101, total 195. Subarachnoid hemorrhage.  In summary, patient was first evaluated by cardiology in March 2017 during hospital admission for congestive heart failure and new onset A-fib.  Echo at that time showed EF 20 to 25%, diffuse hypokinesis, moderate MR, severe BAE, mildly reduced RV function.  R/LHC showed mild LAD disease (details above) as well as severely elevated right heart pressures including wedge pressure.  He underwent successful cardioversion April 2017.  Repeat echo June 2017 demonstrated recovered LV function.  Patient was seen in the office August 2024 with reports of being off all medications for 1 year due to lack of insurance.  He had recently reestablished medical insurance.  EKG showed rate controlled A-fib.  Patient was restarted on Eliquis , Toprol , Entresto , spironolactone .  Repeat echo September 2024 showed EF 30 to 35% with global hypokinesis (details above).  Patient underwent successful cardioversion on 07/30/2023.   Patient was in the office in November 2024 for follow-up post cardioversion. Back in A-fib with controlled rate. During exam patient became profusely diaphoretic, bradycardic and hypotensive. Blood sugar was normal. Patient had taken all medications but did not eat. He received 750 mL of fluid with resolution of diaphoresis and improved BP. Decision was made to allow patient to return home with ED precautions. He was instructed to stop Entresto  and spironolactone  and decrease Toprol  to 25 mg daily. PET/CT ordered. He was scheduled for close follow-up.  Patient was seen in follow-up the following week and was doing well with no further episodes of hypotension/bradycardia.  Home BP readings consistently >/= 120/70 and HR 60 to 70s.  He was restarted on low-dose Entresto .  Patient establish care with Dr. Holts on  11/05/2023.  Cardiac MRI demonstrated findings most consistent with nonischemic  cardiomyopathy.  He followed up with Dr. Cindie late April 2025 and decision was made to proceed with ICD implantation.  He was admitted to the hospital in early August 2025 for Tikosyn  loading and cardioversion.  Patient was last seen in the office by Chantal Needle, NP on 08/04/2024 for routine follow-up.  He was doing well at that time and reported adherence to Tikosyn .     History of Present Illness    Today, patient ***  PAF Onset March 2017.  DCCV April 2017, June 2021, October 2024, August 2025 after Tikosyn  loading.  Denies spontaneous bleeding concerns.  Patient***.  Magnesium  2.3 and potassium 4.4 08/04/2024. - Continue Toprol , Eliquis , Tikosyn . - Continue to follow with EP.   Nonobstructive CAD [facility] March 2017 demonstrated ostial to proximal LAD 40%.  PET stress 10/21/2023 without evidence of ischemia or infarct, high risk secondary to severely reduced LVEF.  Patient*** - Continue Toprol .   Chronic systolic heart failure/nonischemic cardiomyopathy Echo September 2024 showed EF 30 to 35%, global hypokinesis, mildly reduced RV function, normal PA pressure, moderate LAE, mild to moderate MR, mild AI, mild dilatation of ascending aorta 40 mm.  Cardiac MRI March 2025 demonstrated findings consistent with nonischemic cardiomyopathy.  S/p ICD implantation June 2025.  Remote device check October 2025 demonstrated normal device function.  Patient*** Euvolemic and well compensated on exam. - Continue Toprol , Entresto , Jardiance .  Hyperlipidemia LDL 138 August 2024, not at goal.  Patient*** -*** - Lipid panel***  ROS: All other systems reviewed and are otherwise negative except as noted in History of Present Illness.  EKGs/Labs Reviewed        08/04/2024: BUN 18; Creatinine, Ser 0.89; Potassium 4.4; Sodium 141   05/11/2024: Hemoglobin 17.1; WBC 7.4   No results found for requested labs within last 365 days.   No results found for requested labs within last 365 days.  ***  Risk  Assessment/Calculations    {Does this patient have ATRIAL FIBRILLATION?[phone]} No BP recorded.  {Refresh Note OR Click here to enter BP  :1}***        Physical Exam    VS:  There were no vitals taken for this visit. , BMI There is no height or weight on file to calculate BMI.  GEN: Well nourished, well developed, in no acute distress. Neck: No JVD or carotid bruits. Cardiac: *** RRR. *** No murmur. No rubs or gallops.   Respiratory:  Respirations regular and unlabored. Clear to auscultation without rales, wheezing or rhonchi. GI: Soft, nontender, nondistended. Extremities: Radials/DP/PT 2+ and equal bilaterally. No clubbing or cyanosis. No edema ***  Skin: Warm and dry, no rash. Neuro: Strength intact.  Assessment & Plan   ***  Disposition: ***     {Are you ordering a CV Procedure (e.g. stress test, cath, DCCV, TEE, etc)?   Press F2        :[id number]}   Signed, Barnie HERO. Christohper Dube, DNP, NP-C

## 2024-09-15 ENCOUNTER — Ambulatory Visit: Attending: Student | Admitting: Student

## 2024-09-15 ENCOUNTER — Encounter: Payer: Self-pay | Admitting: Student

## 2024-09-15 VITALS — BP 122/90 | HR 49 | Resp 19 | Ht 75.0 in | Wt 248.0 lb

## 2024-09-15 DIAGNOSIS — I2510 Atherosclerotic heart disease of native coronary artery without angina pectoris: Secondary | ICD-10-CM | POA: Diagnosis not present

## 2024-09-15 DIAGNOSIS — Z79899 Other long term (current) drug therapy: Secondary | ICD-10-CM

## 2024-09-15 DIAGNOSIS — I5022 Chronic systolic (congestive) heart failure: Secondary | ICD-10-CM | POA: Diagnosis not present

## 2024-09-15 DIAGNOSIS — I10 Essential (primary) hypertension: Secondary | ICD-10-CM

## 2024-09-15 DIAGNOSIS — I480 Paroxysmal atrial fibrillation: Secondary | ICD-10-CM

## 2024-09-15 DIAGNOSIS — I428 Other cardiomyopathies: Secondary | ICD-10-CM

## 2024-09-15 DIAGNOSIS — E785 Hyperlipidemia, unspecified: Secondary | ICD-10-CM | POA: Diagnosis not present

## 2024-09-15 MED ORDER — METOPROLOL SUCCINATE ER 25 MG PO TB24: 25.0000 mg | ORAL_TABLET | Freq: Every day | ORAL | 3 refills | Status: AC

## 2024-09-15 NOTE — Patient Instructions (Signed)
 Medication Instructions:   Your physician recommends the following medication changes.  DECREASE: Metoprolol  Succinate (Toprol -XL) to 25 mg once daily  *If you need a refill on your cardiac medications before your next appointment, please call your pharmacy*  Lab Work:  Your provider would like for you to have following labs drawn today Lipid Panel.    If you have labs (blood work) drawn today and your tests are completely normal, you will receive your results only by:  MyChart Message (if you have MyChart) OR  A paper copy in the mail If you have any lab test that is abnormal or we need to change your treatment, we will call you to review the results.  Testing/Procedures:  EKG Today  Referrals:  None ordered at this time   Follow-Up:  At [facility], you and your health needs are our priority.  As part of our continuing mission to provide you with exceptional heart care, our providers are all part of one team.  This team includes your primary Cardiologist (physician) and Advanced Practice Providers or APPs (Physician Assistants and Nurse Practitioners) who all work together to provide you with the care you need, when you need it.  Your next appointment:   5 - 6 month(s)  Provider:    Evalene Lunger, MD or Barnie Hila, NP    We recommend signing up for the patient portal called MyChart.  Sign up information is provided on this After Visit Summary.  MyChart is used to connect with patients for Virtual Visits (Telemedicine).  Patients are able to view lab/test results, encounter notes, upcoming appointments, etc.  Non-urgent messages can be sent to your provider as well.   To learn more about what you can do with MyChart, go to [email or web address].

## 2024-09-16 ENCOUNTER — Ambulatory Visit: Payer: Self-pay | Admitting: Student

## 2024-09-16 LAB — LIPID PANEL
Chol/HDL Ratio: 6.4 ratio — ABNORMAL HIGH (ref 0.0–5.0)
Cholesterol, Total: 320 mg/dL — ABNORMAL HIGH (ref 100–199)
HDL: 50 mg/dL (ref >39)
LDL Chol Calc (NIH): 223 mg/dL — ABNORMAL HIGH (ref 0–99)
Triglycerides: 233 mg/dL — ABNORMAL HIGH (ref 0–149)
VLDL Cholesterol Cal: 47 mg/dL — ABNORMAL HIGH (ref 5–40)

## 2024-09-20 NOTE — Telephone Encounter (Signed)
 Patient had called to return this RN's earlier call from yesterday.  Relayed the results of the lab work as interpreted by Barnie Hila, NP and the recommendations to start Rosuvastatin  (Crestor ) 40 mg once daily and repeat blood work in about 2 months.  Orders put in for the lab work and prescription for the medication sent to [facility] on [address].  Patient expressed gratitude for the call with all questions and concerns addressed at this time.

## 2024-09-20 NOTE — Telephone Encounter (Signed)
-----   Message from Bernarda JONETTA Fireman sent at 09/20/2024  8:43 AM EST -----

## 2024-09-20 NOTE — Telephone Encounter (Signed)
 Pt requesting a c/b to discuss results.

## 2024-09-20 NOTE — Addendum Note (Signed)
 Addended by: Efrata Brunner A on: 09/20/2024 09:15 AM   Modules accepted: Orders

## 2024-09-29 ENCOUNTER — Other Ambulatory Visit: Payer: Self-pay | Admitting: Cardiology

## 2024-09-29 DIAGNOSIS — I4891 Unspecified atrial fibrillation: Secondary | ICD-10-CM

## 2024-09-29 NOTE — Telephone Encounter (Signed)
 Prescription refill request for Eliquis  received. Indication:afib Last office visit:12/25 Scr: 0.89  10/25 Age:63 Weight:112.5  kg  Prescription refilled

## 2024-10-19 ENCOUNTER — Other Ambulatory Visit: Payer: Self-pay | Admitting: Student

## 2024-10-19 DIAGNOSIS — I428 Other cardiomyopathies: Secondary | ICD-10-CM

## 2024-10-23 ENCOUNTER — Ambulatory Visit

## 2024-10-23 DIAGNOSIS — I4891 Unspecified atrial fibrillation: Secondary | ICD-10-CM

## 2024-10-24 LAB — CUP PACEART REMOTE DEVICE CHECK
Battery Remaining Longevity: 144 mo
Battery Remaining Percentage: 100 %
Brady Statistic RA Percent Paced: 2 %
Brady Statistic RV Percent Paced: 0 %
Date Time Interrogation Session: 20260119002100
HighPow Impedance: 83 Ohm
Implantable Lead Connection Status: 753985
Implantable Lead Connection Status: 753985
Implantable Lead Implant Date: 20250606
Implantable Lead Implant Date: 20250606
Implantable Lead Location: 753859
Implantable Lead Location: 753860
Implantable Lead Model: 673
Implantable Lead Model: 7841
Implantable Lead Serial Number: 1579360
Implantable Lead Serial Number: 2599026
Implantable Pulse Generator Implant Date: 20250606
Lead Channel Impedance Value: 429 Ohm
Lead Channel Impedance Value: 600 Ohm
Lead Channel Pacing Threshold Amplitude: 0.6 V
Lead Channel Pacing Threshold Amplitude: 0.7 V
Lead Channel Pacing Threshold Pulse Width: 0.4 ms
Lead Channel Pacing Threshold Pulse Width: 0.4 ms
Lead Channel Setting Pacing Amplitude: 2 V
Lead Channel Setting Pacing Amplitude: 2 V
Lead Channel Setting Pacing Pulse Width: 0.4 ms
Lead Channel Setting Sensing Sensitivity: 0.5 mV
Pulse Gen Serial Number: 695542
Zone Setting Status: 755011

## 2024-10-27 NOTE — Progress Notes (Signed)
 Remote ICD Transmission

## 2024-10-29 ENCOUNTER — Ambulatory Visit: Payer: Self-pay | Admitting: Cardiology

## 2024-10-31 ENCOUNTER — Other Ambulatory Visit: Payer: Self-pay | Admitting: Student

## 2024-10-31 ENCOUNTER — Other Ambulatory Visit (HOSPITAL_COMMUNITY): Payer: Self-pay

## 2024-10-31 DIAGNOSIS — I4819 Other persistent atrial fibrillation: Secondary | ICD-10-CM

## 2024-10-31 MED ORDER — DOFETILIDE 500 MCG PO CAPS
500.0000 ug | ORAL_CAPSULE | Freq: Two times a day (BID) | ORAL | 6 refills | Status: DC
  Filled 2024-10-31 – 2024-11-09 (×2): qty 60, 30d supply, fill #0

## 2024-11-06 ENCOUNTER — Telehealth: Payer: Self-pay | Admitting: Student

## 2024-11-06 DIAGNOSIS — I4819 Other persistent atrial fibrillation: Secondary | ICD-10-CM

## 2024-11-08 ENCOUNTER — Other Ambulatory Visit (HOSPITAL_COMMUNITY): Payer: Self-pay

## 2024-11-08 ENCOUNTER — Other Ambulatory Visit: Payer: Self-pay

## 2024-11-08 DIAGNOSIS — I4819 Other persistent atrial fibrillation: Secondary | ICD-10-CM

## 2024-11-08 MED ORDER — DOFETILIDE 500 MCG PO CAPS: 500.0000 ug | ORAL_CAPSULE | Freq: Two times a day (BID) | ORAL | 6 refills | Status: DC

## 2024-11-09 ENCOUNTER — Other Ambulatory Visit: Payer: Self-pay

## 2024-11-09 ENCOUNTER — Other Ambulatory Visit (HOSPITAL_COMMUNITY): Payer: Self-pay

## 2024-11-09 DIAGNOSIS — I4819 Other persistent atrial fibrillation: Secondary | ICD-10-CM

## 2024-11-09 MED ORDER — DOFETILIDE 500 MCG PO CAPS: 500.0000 ug | ORAL_CAPSULE | Freq: Two times a day (BID) | ORAL | 6 refills | Status: AC

## 2024-11-09 NOTE — Telephone Encounter (Signed)
" °*  STAT* If patient is at the pharmacy, call can be transferred to refill team.  Pt c/o medication issue:  1. Name of Medication:   dofetilide  (TIKOSYN ) 500 MCG capsule   2. How are you currently taking this medication (dosage and times per day)?   3. Are you having a reaction (difficulty breathing--STAT)?   4. What is your medication issue?   Patient stated his pharmacy [facility] [address] [place], [place] - [address]) told him they did not received his refill and it may have been sent to [facility].  Patient wants the prescription re-sent to [facility] [address] [place] - [address]  Patient noted he is completely out of this medication.      "

## 2024-11-10 ENCOUNTER — Ambulatory Visit: Admitting: Cardiology

## 2024-11-24 ENCOUNTER — Ambulatory Visit: Admitting: Physician Assistant

## 2025-01-22 ENCOUNTER — Encounter

## 2025-03-02 ENCOUNTER — Ambulatory Visit: Admitting: Student

## 2025-04-23 ENCOUNTER — Encounter

## 2025-07-23 ENCOUNTER — Encounter

## 2025-10-22 ENCOUNTER — Encounter

## 2026-01-21 ENCOUNTER — Encounter
